# Patient Record
Sex: Female | Born: 1945 | Hispanic: Yes | Smoking: Never smoker
Health system: Southern US, Community
[De-identification: ages and names within clinical notes are randomized; demographics above are authoritative.]

## PROBLEM LIST (undated history)

## (undated) DIAGNOSIS — E079 Disorder of thyroid, unspecified: Secondary | ICD-10-CM

## (undated) DIAGNOSIS — E119 Type 2 diabetes mellitus without complications: Secondary | ICD-10-CM

## (undated) DIAGNOSIS — E785 Hyperlipidemia, unspecified: Secondary | ICD-10-CM

## (undated) DIAGNOSIS — I10 Essential (primary) hypertension: Secondary | ICD-10-CM

## (undated) DIAGNOSIS — S46819A Strain of other muscles, fascia and tendons at shoulder and upper arm level, unspecified arm, initial encounter: Secondary | ICD-10-CM

## (undated) DIAGNOSIS — H269 Unspecified cataract: Secondary | ICD-10-CM

## (undated) DIAGNOSIS — H3530 Unspecified macular degeneration: Secondary | ICD-10-CM

## (undated) DIAGNOSIS — D649 Anemia, unspecified: Secondary | ICD-10-CM

## (undated) DIAGNOSIS — N6009 Solitary cyst of unspecified breast: Secondary | ICD-10-CM

## (undated) DIAGNOSIS — M810 Age-related osteoporosis without current pathological fracture: Secondary | ICD-10-CM

## (undated) HISTORY — PX: ANKLE FRACTURE SURGERY: SHX122

## (undated) HISTORY — DX: Anemia, unspecified: D64.9

## (undated) HISTORY — DX: Unspecified cataract: H26.9

## (undated) HISTORY — DX: Hyperlipidemia, unspecified: E78.5

## (undated) HISTORY — DX: Age-related osteoporosis without current pathological fracture: M81.0

## (undated) HISTORY — DX: Strain of other muscles, fascia and tendons at shoulder and upper arm level, unspecified arm, initial encounter: S46.819A

## (undated) HISTORY — PX: CATARACT EXTRACTION: SUR2

## (undated) HISTORY — DX: Disorder of thyroid, unspecified: E07.9

## (undated) HISTORY — PX: TONSILLECTOMY: SUR1361

---

## 2013-04-15 ENCOUNTER — Encounter (HOSPITAL_COMMUNITY): Payer: Self-pay | Admitting: Emergency Medicine

## 2013-04-15 ENCOUNTER — Emergency Department (HOSPITAL_COMMUNITY)
Admission: EM | Admit: 2013-04-15 | Discharge: 2013-04-15 | Disposition: A | Discharge status: Home / Self Care | Payer: PRIVATE HEALTH INSURANCE | Source: Home / Self Care | Attending: Family Medicine | Admitting: Family Medicine

## 2013-04-15 DIAGNOSIS — I10 Essential (primary) hypertension: Secondary | ICD-10-CM

## 2013-04-15 DIAGNOSIS — E119 Type 2 diabetes mellitus without complications: Secondary | ICD-10-CM

## 2013-04-15 HISTORY — DX: Unspecified macular degeneration: H35.30

## 2013-04-15 HISTORY — DX: Essential (primary) hypertension: I10

## 2013-04-15 HISTORY — DX: Type 2 diabetes mellitus without complications: E11.9

## 2013-04-15 LAB — POCT I-STAT, CHEM 8
Chloride: 102 mEq/L (ref 96–112)
Glucose, Bld: 255 mg/dL — ABNORMAL HIGH (ref 70–99)
HCT: 37 % (ref 36.0–46.0)
Hemoglobin: 12.6 g/dL (ref 12.0–15.0)
Potassium: 4.2 mEq/L (ref 3.5–5.1)
Sodium: 140 mEq/L (ref 135–145)
TCO2: 25 mmol/L (ref 0–100)

## 2013-04-15 MED ORDER — INSULIN DETEMIR 100 UNIT/ML FLEXPEN: 30.0000 [IU] | PEN_INJECTOR | Freq: Every morning | SUBCUTANEOUS | Status: DC

## 2013-04-15 MED ORDER — GLIPIZIDE-METFORMIN HCL 5-500 MG PO TABS: 1.0000 | ORAL_TABLET | Freq: Two times a day (BID) | ORAL | Status: DC

## 2013-04-15 MED ORDER — OLMESARTAN MEDOXOMIL-HCTZ 40-12.5 MG PO TABS: 1.0000 | ORAL_TABLET | Freq: Every day | ORAL | Status: DC

## 2013-04-15 NOTE — ED Notes (Signed)
Staff making calls to see if scripts can be filled and [facility]

## 2013-04-15 NOTE — ED Notes (Signed)
Medication refill. Appointment is scheduled for 12/23, patient will run out of medicine prior to then

## 2013-04-15 NOTE — ED Provider Notes (Signed)
CSN: [id number]     Arrival date & time 04/15/13  0901 History   First MD Initiated Contact with Patient 04/15/13 [phone]     Chief Complaint  Patient presents with  . Medication Refill   (Consider location/radiation/quality/duration/timing/severity/associated sxs/prior Treatment) Patient is a 67 y.o. female presenting with general illness. The history is provided by the patient and a relative.  Illness Context:  Here for med refills as she has md appt in dec and meds have run out for dm and hbp. Associated symptoms: no abdominal pain, no chest pain and no fever     Past Medical History  Diagnosis Date  . Hypertension   . Diabetes mellitus without complication   . Macular degeneration    History reviewed. No pertinent past surgical history. No family history on file. History  Substance Use Topics  . Smoking status: Never Smoker   . Smokeless tobacco: Not on file  . Alcohol Use: No   OB History   Grav Para Term Preterm Abortions TAB SAB Ect Mult Living                 Review of Systems  Constitutional: Negative.  Negative for fever.  Cardiovascular: Negative for chest pain.  Gastrointestinal: Negative for abdominal pain.  Neurological: Negative.     Allergies  Review of patient's allergies indicates no known allergies.  Home Medications   Current Outpatient Rx  Name  Route  Sig  Dispense  Refill  . glipiZIDE-metformin (METAGLIP) 5-500 MG per tablet   Oral   Take 1 tablet by mouth 2 (two) times daily before a meal.         . insulin detemir (LEVEMIR) 100 UNIT/ML injection   Subcutaneous   Inject 30 Units into the skin at bedtime.         Marland Kitchen olmesartan-hydrochlorothiazide (BENICAR HCT) 40-12.5 MG per tablet   Oral   Take 1 tablet by mouth daily.         Marland Kitchen glipiZIDE-metformin (METAGLIP) 5-500 MG per tablet   Oral   Take 1 tablet by mouth 2 (two) times daily before a meal.   60 tablet   1   . Insulin Detemir 100 UNIT/ML SOPN   Subcutaneous   Inject 30  Units into the skin every morning.   3 pen   1   . olmesartan-hydrochlorothiazide (BENICAR HCT) 40-12.5 MG per tablet   Oral   Take 1 tablet by mouth daily.   30 tablet   1    BP 181/98  Pulse 78  Temp(Src) 98.4 F (36.9 C) (Oral)  Resp 17  SpO2 95% Physical Exam  Nursing note and vitals reviewed. Constitutional: She is oriented to person, place, and time. She appears well-developed and well-nourished.  Cardiovascular: Regular rhythm.   Musculoskeletal: She exhibits no edema.  Neurological: She is alert and oriented to person, place, and time.  Skin: Skin is warm and dry.    ED Course  Procedures (including critical care time) Labs Review Labs Reviewed  POCT I-STAT, CHEM 8 - Abnormal; Notable for the following:    Glucose, Bld 255 (*)    All other components within normal limits   Imaging Review No results found.  EKG Interpretation     Ventricular Rate:    PR Interval:    QRS Duration:   QT Interval:    QTC Calculation:   R Axis:     Text Interpretation:  MDM      Linna Hoff, MD 04/15/13 [phone]

## 2013-05-31 ENCOUNTER — Ambulatory Visit: Payer: PRIVATE HEALTH INSURANCE | Attending: Internal Medicine | Admitting: Internal Medicine

## 2013-05-31 ENCOUNTER — Encounter: Payer: Self-pay | Admitting: Internal Medicine

## 2013-05-31 VITALS — BP 150/80 | HR 73 | Temp 99.1°F | Resp 17 | Wt 181.8 lb

## 2013-05-31 DIAGNOSIS — Z139 Encounter for screening, unspecified: Secondary | ICD-10-CM

## 2013-05-31 DIAGNOSIS — Z23 Encounter for immunization: Secondary | ICD-10-CM

## 2013-05-31 DIAGNOSIS — Z1211 Encounter for screening for malignant neoplasm of colon: Secondary | ICD-10-CM

## 2013-05-31 DIAGNOSIS — K5900 Constipation, unspecified: Secondary | ICD-10-CM

## 2013-05-31 DIAGNOSIS — E119 Type 2 diabetes mellitus without complications: Secondary | ICD-10-CM | POA: Insufficient documentation

## 2013-05-31 DIAGNOSIS — I10 Essential (primary) hypertension: Secondary | ICD-10-CM | POA: Insufficient documentation

## 2013-05-31 LAB — POCT GLYCOSYLATED HEMOGLOBIN (HGB A1C): Hemoglobin A1C: 9.5

## 2013-05-31 LAB — GLUCOSE, POCT (MANUAL RESULT ENTRY): POC Glucose: 240 mg/dl — AB (ref 70–99)

## 2013-05-31 MED ORDER — OLMESARTAN MEDOXOMIL-HCTZ 40-12.5 MG PO TABS: 1.0000 | ORAL_TABLET | Freq: Every day | ORAL | Status: DC

## 2013-05-31 MED ORDER — INSULIN NPH ISOPHANE & REGULAR (70-30) 100 UNIT/ML ~~LOC~~ SUSP: 18.0000 [IU] | Freq: Two times a day (BID) | SUBCUTANEOUS | Status: DC

## 2013-05-31 MED ORDER — GLIPIZIDE-METFORMIN HCL 5-500 MG PO TABS: 2.0000 | ORAL_TABLET | Freq: Two times a day (BID) | ORAL | Status: DC

## 2013-05-31 MED ORDER — MAGNESIUM HYDROXIDE 400 MG/5ML PO SUSP: 5.0000 mL | Freq: Every day | ORAL | Status: DC | PRN

## 2013-05-31 NOTE — Progress Notes (Signed)
Patient Demographics  Cynthia Mata, is a 67 y.o. female  ZOX:[id number]  [id number]  DOB - 08-29-1945  CC:  Chief Complaint  Patient presents with  . Diabetes       HPI: Cynthia Mata is a 67 y.o. female here today to establish medical care. She has history of diabetes hypertension, she was taking glipizide/metformin 1 tab twice a day as well as Levemir 30 units each bedtime, she denies any hypoglycemic symptoms today her hemoglobin A1c is 9.5%. Patient used to see an ophthalmologist in the past. Patient reported to have constipation. Patient has No headache, No chest pain, No abdominal pain - No Nausea, No new weakness tingling or numbness, No Cough - SOB.  No Known Allergies Past Medical History  Diagnosis Date  . Hypertension   . Diabetes mellitus without complication   . Macular degeneration    Current Outpatient Prescriptions on File Prior to Visit  Medication Sig Dispense Refill  . glipiZIDE-metformin (METAGLIP) 5-500 MG per tablet Take 1 tablet by mouth 2 (two) times daily before a meal.      . Insulin Detemir 100 UNIT/ML SOPN Inject 30 Units into the skin every morning.  3 pen  1   No current facility-administered medications on file prior to visit.   Family History  Problem Relation Age of Onset  . Hypertension Maternal Aunt    History   Social History  . Marital Status: Married    Spouse Name: N/A    Number of Children: N/A  . Years of Education: N/A   Occupational History  . Not on file.   Social History Main Topics  . Smoking status: Never Smoker   . Smokeless tobacco: Not on file  . Alcohol Use: No  . Drug Use: No  . Sexual Activity: Not on file   Other Topics Concern  . Not on file   Social History Narrative  . No narrative on file    Review of Systems: Constitutional: Negative for fever, chills, diaphoresis, activity change, appetite change and fatigue. HENT: Negative for ear pain, nosebleeds, congestion, facial  swelling, rhinorrhea, neck pain, neck stiffness and ear discharge.  Eyes: Negative for pain, discharge, redness, itching and visual disturbance. Respiratory: Negative for cough, choking, chest tightness, shortness of breath, wheezing and stridor.  Cardiovascular: Negative for chest pain, palpitations and leg swelling. Gastrointestinal: Negative for abdominal distention. + for constipation  Genitourinary: Negative for dysuria, urgency, frequency, hematuria, flank pain, decreased urine volume, difficulty urinating and dyspareunia.  Musculoskeletal: Negative for back pain, joint swelling, arthralgia and gait problem. Neurological: Negative for dizziness, tremors, seizures, syncope, facial asymmetry, speech difficulty, weakness, light-headedness, numbness and headaches.  Hematological: Negative for adenopathy. Does not bruise/bleed easily. Psychiatric/Behavioral: Negative for hallucinations, behavioral problems, confusion, dysphoric mood, decreased concentration and agitation.    Objective:   Filed Vitals:   05/31/13 1620  BP: 150/80  Pulse:   Temp:   Resp:     Physical Exam: Constitutional: Patient appears well-developed and well-nourished. No distress. HENT: Normocephalic, atraumatic, External right and left ear normal. Oropharynx is clear and moist.  Eyes: Conjunctivae and EOM are normal. PERRLA, no scleral icterus. Neck: Normal ROM. Neck supple. No JVD. No tracheal deviation. No thyromegaly. CVS: RRR, S1/S2 +, no murmurs, no gallops, no carotid bruit.  Pulmonary: Effort and breath sounds normal, no stridor, rhonchi, wheezes, rales.  Abdominal: Soft. BS +, no distension, tenderness, rebound or guarding.  Musculoskeletal: Normal range of motion. No edema and no tenderness.  Neuro: Alert. Normal reflexes, muscle tone coordination. No cranial nerve deficit. Skin: Skin is warm and dry. No rash noted. Not diaphoretic. No erythema. No pallor. Psychiatric: Normal mood and affect. Behavior,  judgment, thought content normal.  Lab Results  Component Value Date   HGB 12.6 04/15/2013   HCT 37.0 04/15/2013   Lab Results  Component Value Date   CREATININE 0.90 04/15/2013   BUN 19 04/15/2013   NA 140 04/15/2013   K 4.2 04/15/2013   CL 102 04/15/2013    Lab Results  Component Value Date   HGBA1C 9.5 05/31/2013   Lipid Panel  No results found for this basename: chol, trig, hdl, cholhdl, vldl, ldlcalc       Assessment and plan:   1. DM (diabetes mellitus)  - Glucose (CBG) - HgB A1c 9.5% uncontrolled - Ambulatory referral to Ophthalmology -I have increased the dose glipiZIDE-metformin (METAGLIP) 5-500 MG per tablet; Take 2 tablets by mouth 2 (two) times daily before a meal.  Dispense: 120 tablet; Refill: 1 Humulin 70/30 18 units twice a day (patient can't afford Levemir)  2. Essential hypertension, benign  - olmesartan-hydrochlorothiazide (BENICAR HCT) 40-12.5 MG per tablet; Take 1 tablet by mouth daily.  Dispense: 30 tablet; Refill: 1 Advise for low salt diet we'll reevaluate on the next visit if blood pressure is persistently high consider adding another medication.  3. Screening  - COMPLETE METABOLIC PANEL WITH GFR; Future - CBC with Differential; Future - TSH; Future - Lipid panel; Future - Vit D  25 hydroxy (rtn osteoporosis monitoring); Future   5. Special screening for malignant neoplasms, colon  - Ambulatory referral to Gastroenterology  6. Unspecified constipation  - magnesium hydroxide (MILK OF MAGNESIA) 400 MG/5ML suspension; Take 5 mLs by mouth daily as needed for mild constipation.  Dispense: 360 mL; Refill: 0       Health Maintenance -Colonoscopy: referral done  Flu shot today   Follow up in 6 weeks    Jandiel Magallanes, Ayesha Rumpf, MD

## 2013-05-31 NOTE — Progress Notes (Signed)
Patient here to establish care History of DM and HTN Is currently on levemir but would like dr to switch to insulin we carry in our pharmacy

## 2013-05-31 NOTE — Patient Instructions (Signed)
2 Gram Low Sodium Diet A 2 gram sodium diet restricts the amount of sodium in the diet to no more than 2 g or 2000 mg daily. Limiting the amount of sodium is often used to help lower blood pressure. It is important if you have heart, liver, or kidney problems. Many foods contain sodium for flavor and sometimes as a preservative. When the amount of sodium in a diet needs to be low, it is important to know what to look for when choosing foods and drinks. The following includes some information and guidelines to help make it easier for you to adapt to a low sodium diet. QUICK TIPS  Do not add salt to food.  Avoid convenience items and fast food.  Choose unsalted snack foods.  Buy lower sodium products, often labeled as "lower sodium" or "no salt added."  Check food labels to learn how much sodium is in 1 serving.  When eating at a restaurant, ask that your food be prepared with less salt or none, if possible. READING FOOD LABELS FOR SODIUM INFORMATION The nutrition facts label is a good place to find how much sodium is in foods. Look for products with no more than 500 to 600 mg of sodium per meal and no more than 150 mg per serving. Remember that 2 g = 2000 mg. The food label may also list foods as:  Sodium-free: Less than 5 mg in a serving.  Very low sodium: 35 mg or less in a serving.  Low-sodium: 140 mg or less in a serving.  Light in sodium: 50% less sodium in a serving. For example, if a food that usually has 300 mg of sodium is changed to become light in sodium, it will have 150 mg of sodium.  Reduced sodium: 25% less sodium in a serving. For example, if a food that usually has 400 mg of sodium is changed to reduced sodium, it will have 300 mg of sodium. CHOOSING FOODS Grains  Avoid: Salted crackers and snack items. Some cereals, including instant hot cereals. Bread stuffing and biscuit mixes. Seasoned rice or pasta mixes.  Choose: Unsalted snack items. Low-sodium cereals, oats,  puffed wheat and rice, shredded wheat. English muffins and bread. Pasta. Meats  Avoid: Salted, canned, smoked, spiced, pickled meats, including fish and poultry. Bacon, ham, sausage, cold cuts, hot dogs, anchovies.  Choose: Low-sodium canned tuna and salmon. Fresh or frozen meat, poultry, and fish. Dairy  Avoid: Processed cheese and spreads. Cottage cheese. Buttermilk and condensed milk. Regular cheese.  Choose: Milk. Low-sodium cottage cheese. Yogurt. Sour cream. Low-sodium cheese. Fruits and Vegetables  Avoid: Regular canned vegetables. Regular canned tomato sauce and paste. Frozen vegetables in sauces. Olives. Pickles. Relishes. Sauerkraut.  Choose: Low-sodium canned vegetables. Low-sodium tomato sauce and paste. Frozen or fresh vegetables. Fresh and frozen fruit. Condiments  Avoid: Canned and packaged gravies. Worcestershire sauce. Tartar sauce. Barbecue sauce. Soy sauce. Steak sauce. Ketchup. Onion, garlic, and table salt. Meat flavorings and tenderizers.  Choose: Fresh and dried herbs and spices. Low-sodium varieties of mustard and ketchup. Lemon juice. Tabasco sauce. Horseradish. SAMPLE 2 GRAM SODIUM MEAL PLAN Breakfast / Sodium (mg)  1 cup low-fat milk / 143 mg  2 slices whole-wheat toast / 270 mg  1 tbs heart-healthy margarine / 153 mg  1 hard-boiled egg / 139 mg  1 small orange / 0 mg Lunch / Sodium (mg)  1 cup raw carrots / 76 mg   cup hummus / 298 mg  1 cup low-fat milk /   143 mg   cup red grapes / 2 mg  1 whole-wheat pita bread / 356 mg Dinner / Sodium (mg)  1 cup whole-wheat pasta / 2 mg  1 cup low-sodium tomato sauce / 73 mg  3 oz lean ground beef / 57 mg  1 small side salad (1 cup raw spinach leaves,  cup cucumber,  cup yellow bell pepper) with 1 tsp olive oil and 1 tsp red wine vinegar / 25 mg Snack / Sodium (mg)  1 container low-fat vanilla yogurt / 107 mg  3 graham cracker squares / 127 mg Nutrient Analysis  Calories: 2033  Protein:  77 g  Carbohydrate: 282 g  Fat: 72 g  Sodium: 1971 mg Document Released: 05/26/2005 Document Revised: 08/18/2011 Document Reviewed: 08/27/2009 [employer] Patient Information 2014 [employer].  

## 2013-07-06 ENCOUNTER — Other Ambulatory Visit: Payer: PRIVATE HEALTH INSURANCE

## 2013-07-12 ENCOUNTER — Ambulatory Visit: Payer: Self-pay

## 2013-08-14 LAB — HM DIABETES EYE EXAM

## 2014-06-29 ENCOUNTER — Ambulatory Visit (INDEPENDENT_AMBULATORY_CARE_PROVIDER_SITE_OTHER): Payer: 59 | Admitting: Medical

## 2014-06-29 ENCOUNTER — Telehealth: Payer: Self-pay | Admitting: *Deleted

## 2014-06-29 ENCOUNTER — Encounter: Payer: Self-pay | Admitting: Medical

## 2014-06-29 VITALS — BP 150/80 | HR 74 | Temp 97.6°F | Ht 61.0 in | Wt 178.8 lb

## 2014-06-29 DIAGNOSIS — E119 Type 2 diabetes mellitus without complications: Secondary | ICD-10-CM

## 2014-06-29 DIAGNOSIS — E039 Hypothyroidism, unspecified: Secondary | ICD-10-CM

## 2014-06-29 DIAGNOSIS — J0100 Acute maxillary sinusitis, unspecified: Secondary | ICD-10-CM

## 2014-06-29 DIAGNOSIS — I10 Essential (primary) hypertension: Secondary | ICD-10-CM

## 2014-06-29 DIAGNOSIS — D649 Anemia, unspecified: Secondary | ICD-10-CM

## 2014-06-29 DIAGNOSIS — E785 Hyperlipidemia, unspecified: Secondary | ICD-10-CM

## 2014-06-29 DIAGNOSIS — E038 Other specified hypothyroidism: Secondary | ICD-10-CM

## 2014-06-29 LAB — COMPREHENSIVE METABOLIC PANEL
ALT: 23 U/L (ref 0–35)
AST: 26 U/L (ref 0–37)
Albumin: 4 g/dL (ref 3.5–5.2)
Alkaline Phosphatase: 92 U/L (ref 39–117)
BUN: 15 mg/dL (ref 6–23)
CALCIUM: 9.2 mg/dL (ref 8.4–10.5)
CO2: 29 meq/L (ref 19–32)
CREATININE: 0.73 mg/dL (ref 0.40–1.20)
Chloride: 103 mEq/L (ref 96–112)
GFR: 84.44 mL/min (ref >60.00)
GLUCOSE: 158 mg/dL — AB (ref 70–99)
POTASSIUM: 3.7 meq/L (ref 3.5–5.1)
SODIUM: 140 meq/L (ref 135–145)
Total Bilirubin: 0.3 mg/dL (ref 0.2–1.2)
Total Protein: 7.2 g/dL (ref 6.0–8.3)

## 2014-06-29 LAB — LIPID PANEL
CHOLESTEROL: 206 mg/dL — AB (ref 0–200)
HDL: 51.7 mg/dL (ref >39.00)
LDL CALC: 134 mg/dL — AB (ref 0–99)
NonHDL: 154.3
Total CHOL/HDL Ratio: 4
Triglycerides: 100 mg/dL (ref 0.0–149.0)
VLDL: 20 mg/dL (ref 0.0–40.0)

## 2014-06-29 LAB — CBC WITH DIFFERENTIAL/PLATELET
Basophils Absolute: 0 10*3/uL (ref 0.0–0.1)
Basophils Relative: 0.4 % (ref 0.0–3.0)
EOS ABS: 0.1 10*3/uL (ref 0.0–0.7)
EOS PCT: 1.5 % (ref 0.0–5.0)
HCT: 33.3 % — ABNORMAL LOW (ref 36.0–46.0)
HEMOGLOBIN: 11 g/dL — AB (ref 12.0–15.0)
Lymphocytes Relative: 31.7 % (ref 12.0–46.0)
Lymphs Abs: 1.7 10*3/uL (ref 0.7–4.0)
MCHC: 33 g/dL (ref 30.0–36.0)
MCV: 86.7 fl (ref 78.0–100.0)
MONO ABS: 0.3 10*3/uL (ref 0.1–1.0)
Monocytes Relative: 6.5 % (ref 3.0–12.0)
NEUTROS PCT: 59.9 % (ref 43.0–77.0)
Neutro Abs: 3.1 10*3/uL (ref 1.4–7.7)
Platelets: 299 10*3/uL (ref 150.0–400.0)
RBC: 3.84 Mil/uL — ABNORMAL LOW (ref 3.87–5.11)
RDW: 13.9 % (ref 11.5–15.5)
WBC: 5.3 10*3/uL (ref 4.0–10.5)

## 2014-06-29 LAB — TSH: TSH: 3.74 u[IU]/mL (ref 0.35–4.50)

## 2014-06-29 LAB — HEMOGLOBIN A1C: Hgb A1c MFr Bld: 9.4 % — ABNORMAL HIGH (ref 4.6–6.5)

## 2014-06-29 MED ORDER — FLUTICASONE PROPIONATE 50 MCG/ACT NA SUSP: 2.0000 | Freq: Every day | NASAL | Status: DC

## 2014-06-29 MED ORDER — CEFDINIR 300 MG PO CAPS: 300.0000 mg | ORAL_CAPSULE | Freq: Two times a day (BID) | ORAL | Status: DC

## 2014-06-29 MED ORDER — LOSARTAN POTASSIUM-HCTZ 50-12.5 MG PO TABS: 1.0000 | ORAL_TABLET | Freq: Every day | ORAL | Status: DC

## 2014-06-29 MED ORDER — ATORVASTATIN CALCIUM 20 MG PO TABS: 20.0000 mg | ORAL_TABLET | Freq: Every day | ORAL | Status: DC

## 2014-06-29 NOTE — Assessment & Plan Note (Signed)
For your diabetes- continue current med regimen. Depending on a1-c may make adjustments. If a1-c on lower side. May drop glipizide.

## 2014-06-29 NOTE — Progress Notes (Signed)
Subjective:    Patient ID: Cynthia Mata, female    DOB: Dec 26, 1945, 69 y.o.   MRN: [id number]  HPI   Pt in with son. First time here.  I have reviewed pt PMH, PSH, FH, Social History and Surgical History  Anemia- hx of mild anemia in past. Given iron in the past.That was in [place].   Diabetes- Has been using insulin for 4 years. Pt last a1-c was 6.8 and basicaly the same. Last A1-c maybe 6 months ago. Pt is using 18 units am and 18 units pm. Very rare occasional bs 50. That was one month ago and only one time. She drank little coke and her bs went back up. Pt is also on met-glipizide.  Pt has history of high blood pressure. She is on procardia xl 60 mg and benicar hct 40-12.5 mg. Pt only used these medication. No others tried before. Pt did not take bp med today.  Pt has hyper lipedemia. No recent checks. 1 month ago she had stress test and did well on that.  No current chest pain. No neurologic signs or symptoms.  Pt told in past low tsh in the past. Pt stopped synthroid when ran out of the medication.  Hx of low vitamin d. She is taking that now.  2 weeks of congestion, sinus pressure, teeth pain. Mild productive cough.  Past Medical History  Diagnosis Date  . Hypertension   . Diabetes mellitus without complication   . Macular degeneration   . Anemia   . Cataract   . Sleep apnea   . Hyperlipidemia   . Osteoporosis   . Thyroid disease     History   Social History  . Marital Status: Married    Spouse Name: N/A    Number of Children: N/A  . Years of Education: N/A   Occupational History  . Not on file.   Social History Main Topics  . Smoking status: Never Smoker   . Smokeless tobacco: Not on file  . Alcohol Use: No  . Drug Use: No  . Sexual Activity: Not on file   Other Topics Concern  . Not on file   Social History Narrative    Past Surgical History  Procedure Laterality Date  . Ankle fracture surgery      Right ankle  . Tonsillectomy       Abcess removed also    Family History  Problem Relation Age of Onset  . Hypertension Maternal Aunt     No Known Allergies  Current Outpatient Prescriptions on File Prior to Visit  Medication Sig Dispense Refill  . insulin NPH-regular (NOVOLIN 70/30) (70-30) 100 UNIT/ML injection Inject 18 Units into the skin 2 (two) times daily with a meal. 10 mL 6  . olmesartan-hydrochlorothiazide (BENICAR HCT) 40-12.5 MG per tablet Take 1 tablet by mouth daily. 30 tablet 1  . glipiZIDE-metformin (METAGLIP) 5-500 MG per tablet Take 1 tablet by mouth 2 (two) times daily before a meal.    . glipiZIDE-metformin (METAGLIP) 5-500 MG per tablet Take 2 tablets by mouth 2 (two) times daily before a meal. (Patient not taking: Reported on 06/29/2014) 120 tablet 1  . Insulin Detemir 100 UNIT/ML SOPN Inject 30 Units into the skin every morning. (Patient not taking: Reported on 06/29/2014) 3 pen 1  . magnesium hydroxide (MILK OF MAGNESIA) 400 MG/5ML suspension Take 5 mLs by mouth daily as needed for mild constipation. (Patient not taking: Reported on 06/29/2014) 360 mL 0   No  current facility-administered medications on file prior to visit.    BP 150/80 mmHg  Pulse 74  Temp(Src) 97.6 F (36.4 C) (Oral)  Ht _0  (1.549 m)  Wt 178 lb 12.8 oz (81.103 kg)  BMI 33.80 kg/m2  SpO2 100%      Review of Systems  Constitutional: Negative for fever, chills, diaphoresis, activity change and fatigue.  HENT: Positive for congestion and sinus pressure.   Respiratory: Positive for cough. Negative for chest tightness and shortness of breath.   Cardiovascular: Negative for chest pain, palpitations and leg swelling.  Gastrointestinal: Negative for nausea, vomiting and abdominal pain.  Endocrine: Negative for polydipsia, polyphagia and polyuria.  Musculoskeletal: Negative for neck pain and neck stiffness.  Neurological: Negative for dizziness, tremors, seizures, syncope, facial asymmetry, speech difficulty, weakness,  light-headedness, numbness and headaches.  Psychiatric/Behavioral: Negative for behavioral problems, confusion and agitation. The patient is not nervous/anxious.        Objective:   Physical Exam   General Mental Status- Alert. General Appearance- Not in acute distress.   Skin General: Color- Normal Color. Moisture- Normal Moisture.  Neck Carotid Arteries- Normal color. Moisture- Normal Moisture. No carotid bruits. No JVD.  HEENT Head- Normal. Ear Auditory Canal - Left- Normal. Right - Normal.Tympanic Membrane- Left- Normal. Right- Normal. Eye Sclera/Conjunctiva- Left- Normal. Right- Normal. Nose & Sinuses Nasal Mucosa- Left-  Boggy and Congested. Right-  Boggy and  Congested.Bilateral maxillary and frontal sinus pressure. Mouth & Throat Lips: Upper Lip- Normal: no dryness, cracking, pallor, cyanosis, or vesicular eruption. Lower Lip-Normal: no dryness, cracking, pallor, cyanosis or vesicular eruption. Buccal Mucosa- Bilateral- No Aphthous ulcers. Oropharynx- No Discharge or Erythema. Tonsils: Characteristics- Bilateral- No Erythema or Congestion. Size/Enlargement- Bilateral- No enlargement. Discharge- bilateral-None.  Neck Neck- Supple. No Masses.   Chest and Lung Exam Auscultation: Breath Sounds:-Normal.  Cardiovascular Auscultation:Rythm- Regular. Murmurs & Other Heart Sounds:Auscultation of the heart reveals- No Murmurs.  Abdomen Inspection:-Inspeection Normal. Palpation/Percussion:Note:No mass. Palpation and Percussion of the abdomen reveal- Non Tender, Non Distended + BS, no rebound or guarding.    Neurologic Cranial Nerve exam:- CN III-XII intact(No nystagmus), symmetric smile. Drift Test:- No drift. Romberg Exam:- Negative.  Heal to Toe Gait exam:-Normal. Finger to Nose:- Normal/Intact Strength:- 5/5 equal and symmetric strength both upper and lower extremities.        Assessment & Plan:

## 2014-06-29 NOTE — Assessment & Plan Note (Signed)
  Your appear to have a sinus infection. I am prescribing cefdinir  for the infection. To help with the nasal congestion I prescribed  flonase nasal steroid.

## 2014-06-29 NOTE — Progress Notes (Signed)
Pre visit review using our clinic review tool, if applicable. No additional management support is needed unless otherwise documented below in the visit note. 

## 2014-06-29 NOTE — Telephone Encounter (Signed)
Refill of her atorvastatin. Losartan rx in place of benicar since she will run out in 4 days. Insurance told her not covered. Benciar $300 dollars in month. Appears no time for prior authrorization. By Monday she will be on last tab of benicar.

## 2014-06-29 NOTE — Assessment & Plan Note (Signed)
For you anemia. Will check today with cbc.

## 2014-06-29 NOTE — Telephone Encounter (Signed)
Caller name: Mare LoanCecilia Relation to pt: self Call back number: [phone] [facility] [address], [place]  Reason for call: Pt called insurance, they will not cover Benicar.  She has 4 pills left. She has refills, but can not afford it.  Please advise.

## 2014-06-29 NOTE — Assessment & Plan Note (Signed)
Check you lipid panel today. Then refill atorvastatin accordingly.

## 2014-06-29 NOTE — Assessment & Plan Note (Signed)
For your bp continue the current regimen procardia and benicar/hct. May need to change to other agent in future if insurance will not cover.

## 2014-06-29 NOTE — Assessment & Plan Note (Signed)
For you hypothyroid will get tsh today.

## 2014-06-29 NOTE — Patient Instructions (Addendum)
For you anemia. Will check today with cbc.  For your diabetes- continue current med regimen. Depending on a1-c may make adjustments. If a1-c on lower side. May drop glipizide.  For your bp continue the current regimen procardia and benicar/hct. May need to change to other agent if insurance will not cover.    For you hypothyroid will get tsh today.  Your appear to have a sinus infection. I am prescribing cefdinir  for the infection. To help with the nasal congestion I prescribed  flonase nasal steroid.   Rest, hydrate, tylenol for fever.  Follow up in 2 wks or as needed

## 2014-07-03 ENCOUNTER — Telehealth: Payer: Self-pay | Admitting: Medical

## 2014-07-03 NOTE — Telephone Encounter (Signed)
I talked with the son and advised on new bp med, atorvastatin refill and lab results.

## 2014-07-20 ENCOUNTER — Encounter: Payer: Self-pay | Admitting: Medical

## 2014-07-20 ENCOUNTER — Ambulatory Visit (INDEPENDENT_AMBULATORY_CARE_PROVIDER_SITE_OTHER): Payer: 59 | Admitting: Medical

## 2014-07-20 ENCOUNTER — Other Ambulatory Visit: Payer: Self-pay

## 2014-07-20 VITALS — BP 145/80 | HR 77 | Temp 97.8°F | Ht 61.0 in | Wt 179.2 lb

## 2014-07-20 DIAGNOSIS — E1065 Type 1 diabetes mellitus with hyperglycemia: Secondary | ICD-10-CM

## 2014-07-20 DIAGNOSIS — I10 Essential (primary) hypertension: Secondary | ICD-10-CM

## 2014-07-20 MED ORDER — INSULIN PEN NEEDLE 31G X 8 MM MISC: 1.0000 | Freq: Two times a day (BID) | Status: DC

## 2014-07-20 MED ORDER — ONETOUCH ULTRASOFT LANCETS MISC: Status: DC

## 2014-07-20 MED ORDER — INSULIN NPH ISOPHANE & REGULAR (70-30) 100 UNIT/ML ~~LOC~~ SUSP: SUBCUTANEOUS | Status: DC

## 2014-07-20 MED ORDER — INSULIN NPH ISOPHANE & REGULAR (70-30) 100 UNIT/ML ~~LOC~~ SUSP: 18.0000 [IU] | Freq: Two times a day (BID) | SUBCUTANEOUS | Status: DC

## 2014-07-20 MED ORDER — OLMESARTAN MEDOXOMIL-HCTZ 40-12.5 MG PO TABS: 1.0000 | ORAL_TABLET | Freq: Every day | ORAL | Status: DC

## 2014-07-20 MED ORDER — GLUCOSE BLOOD VI STRP: ORAL_STRIP | Status: DC

## 2014-07-20 NOTE — Addendum Note (Signed)
Addended by: Lurline HareARTER, Robynne Roat E on: 07/20/2014 06:14 PM   Modules accepted: Orders

## 2014-07-20 NOTE — Assessment & Plan Note (Signed)
With your elevated bs close to 500 at times at night and your average a1-c 9.5, I will refer you to endocrinologist. I refilled your Novolun 70/30 today. I want you to increase by 1 unit am and 1 unit pm each day. Continue to check your bs twice a day. Ex tomorrow will use 21 units am  and 21 untis in pm.  I will call in refill of your supplies.  Follow up in 3 months or as needed.

## 2014-07-20 NOTE — Progress Notes (Signed)
Pre visit review using our clinic review tool, if applicable. No additional management support is needed unless otherwise documented below in the visit note. 

## 2014-07-20 NOTE — Progress Notes (Signed)
Subjective:    Patient ID: Cynthia Mata, female    DOB: [birth year], 69 y.o.   MRN: [id number]  HPI   Pt in for follow up. Pt is on benicar. Insurance eventually agreed would pay. Pt is on procardia. No obvious neuro or cardiac symptoms.  Pt blood sugar is elevated. Pt blood sugar moderatley controlled in the am(high 100's) but evenings quite high up to 500 at times. Pt diabetes for 20 years.   Pt is on NPH insulin 70/30. Pt using 20 units in am and 20 units in pm. Last a1-C 9.5.  Pt needs refill of insulin, strips, and lancets.  Pt last tsh was normal.    Review of Systems  Constitutional: Negative for fever, chills, diaphoresis, activity change and fatigue.  Respiratory: Negative for cough, chest tightness and shortness of breath.   Cardiovascular: Negative for chest pain, palpitations and leg swelling.  Gastrointestinal: Negative for nausea, vomiting and abdominal pain.  Musculoskeletal: Negative for neck pain and neck stiffness.  Neurological: Negative for dizziness, tremors, seizures, syncope, facial asymmetry, speech difficulty, weakness, light-headedness, numbness and headaches.  Psychiatric/Behavioral: Negative for behavioral problems, confusion and agitation. The patient is not nervous/anxious.    Past Medical History  Diagnosis Date  . Hypertension   . Diabetes mellitus without complication   . Macular degeneration   . Anemia   . Cataract   . Sleep apnea   . Hyperlipidemia   . Osteoporosis   . Thyroid disease     History   Social History  . Marital Status: Married    Spouse Name: N/A  . Number of Children: N/A  . Years of Education: N/A   Occupational History  . Not on file.   Social History Main Topics  . Smoking status: Never Smoker   . Smokeless tobacco: Not on file  . Alcohol Use: No  . Drug Use: No  . Sexual Activity: Not on file   Other Topics Concern  . Not on file   Social History Narrative    Past Surgical History    Procedure Laterality Date  . Ankle fracture surgery      Right ankle  . Tonsillectomy      Abcess removed also    Family History  Problem Relation Age of Onset  . Hypertension Maternal Aunt     No Known Allergies  Current Outpatient Prescriptions on File Prior to Visit  Medication Sig Dispense Refill  . atorvastatin (LIPITOR) 20 MG tablet Take 1 tablet (20 mg total) by mouth daily. 30 tablet 3  . ergocalciferol (VITAMIN D2) 50000 UNITS capsule Take 50,000 Units by mouth once a week.    . fluticasone (FLONASE) 50 MCG/ACT nasal spray Place 2 sprays into both nostrils daily. 16 g 1  . glipiZIDE-metformin (METAGLIP) 5-500 MG per tablet Take 1 tablet by mouth 2 (two) times daily before a meal.    . glipiZIDE-metformin (METAGLIP) 5-500 MG per tablet Take 2 tablets by mouth 2 (two) times daily before a meal. 120 tablet 1  . magnesium hydroxide (MILK OF MAGNESIA) 400 MG/5ML suspension Take 5 mLs by mouth daily as needed for mild constipation. 360 mL 0  . NIFEdipine (PROCARDIA XL/ADALAT-CC) 60 MG 24 hr tablet Take 60 mg by mouth daily.    Marland Kitchen olmesartan-hydrochlorothiazide (BENICAR HCT) 40-12.5 MG per tablet Take 1 tablet by mouth daily. 30 tablet 1  . sitaGLIPtin-metformin (JANUMET) 50-500 MG per tablet Take 1 tablet by mouth 2 (two) times daily with a meal.    .  cefdinir (OMNICEF) 300 MG capsule Take 1 capsule (300 mg total) by mouth 2 (two) times daily. (Patient not taking: Reported on 07/20/2014) 20 capsule 0  . Insulin Detemir 100 UNIT/ML SOPN Inject 30 Units into the skin every morning. (Patient not taking: Reported on 06/29/2014) 3 pen 1   No current facility-administered medications on file prior to visit.    BP 145/80 mmHg  Pulse 77  Temp(Src) 97.8 F (36.6 C) (Oral)  Ht 5\' 1"  (1.549 m)  Wt 179 lb 3.2 oz (81.285 kg)  BMI 33.88 kg/m2  SpO2 99%       Objective:   Physical Exam   General Mental Status- Alert. General Appearance- Not in acute distress.   Skin General:  Color- Normal Color. Moisture- Normal Moisture.  Neck Carotid Arteries- Normal color. Moisture- Normal Moisture. No carotid bruits. No JVD.  Chest and Lung Exam Auscultation: Breath Sounds:-Normal.  Cardiovascular Auscultation:Rythm- Regular. Murmurs & Other Heart Sounds:Auscultation of the heart reveals- No Murmurs.  Abdomen Inspection:-Inspeection Normal. Palpation/Percussion:Note:No mass. Palpation and Percussion of the abdomen reveal- Non Tender, Non Distended + BS, no rebound or guarding.    Neurologic Cranial Nerve exam:- CN III-XII intact(No nystagmus), symmetric smile. Drift Test:- No drift. Romberg Exam:- Negative.  Heal to Toe Gait exam:-Normal. Finger to Nose:- Normal/Intact Strength:- 5/5 equal and symmetric strength both upper and lower extremities.  Feet see quality metrics portion     Assessment & Plan:

## 2014-07-20 NOTE — Assessment & Plan Note (Signed)
For your bp continue same dose of procardia and new rx of benicar/hct. Stop the losartan. Your bp is fairly controlled.

## 2014-07-20 NOTE — Patient Instructions (Addendum)
Essential hypertension, benign For your bp continue same dose of procardia and new rx of benicar/hct. Stop the losartan. Your bp is fairly controlled.    DM (diabetes mellitus) With your elevated bs close to 500 at times at night and your average a1-c 9.5, I will refer you to endocrinologist. I refilled your Novolun 70/30 today. I want you to increase by 1 unit am and 1 unit pm each day. Continue to check your bs twice a day. Ex tomorrow will use 21 units am  and 21 untis in pm.  I will call in refill of your supplies.  Follow up in 3 months or as needed.

## 2014-07-24 ENCOUNTER — Encounter: Payer: Self-pay | Admitting: Medical

## 2014-07-27 ENCOUNTER — Telehealth: Payer: Self-pay | Admitting: Medical

## 2014-07-27 DIAGNOSIS — E1165 Type 2 diabetes mellitus with hyperglycemia: Secondary | ICD-10-CM

## 2014-07-27 MED ORDER — GLIPIZIDE-METFORMIN HCL 5-500 MG PO TABS: 2.0000 | ORAL_TABLET | Freq: Two times a day (BID) | ORAL | Status: DC

## 2014-07-27 NOTE — Telephone Encounter (Signed)
Please send to [facility] [address] [place] glipizide-metformin 5-500 she takes 2 pills 2 times a day

## 2014-07-27 NOTE — Telephone Encounter (Signed)
Talked with pt son and sent in her rx.

## 2014-07-27 NOTE — Telephone Encounter (Signed)
Called to clarify that pt is not taking janumet. Will refill her metaglip

## 2014-08-07 ENCOUNTER — Encounter: Payer: Self-pay | Admitting: Endocrinology

## 2014-08-07 ENCOUNTER — Ambulatory Visit (INDEPENDENT_AMBULATORY_CARE_PROVIDER_SITE_OTHER): Payer: 59 | Admitting: Endocrinology

## 2014-08-07 VITALS — BP 134/78 | HR 75 | Temp 98.9°F | Ht 61.0 in | Wt 181.2 lb

## 2014-08-07 DIAGNOSIS — E1165 Type 2 diabetes mellitus with hyperglycemia: Secondary | ICD-10-CM

## 2014-08-07 DIAGNOSIS — E7800 Pure hypercholesterolemia, unspecified: Secondary | ICD-10-CM

## 2014-08-07 DIAGNOSIS — E08319 Diabetes mellitus due to underlying condition with unspecified diabetic retinopathy without macular edema: Secondary | ICD-10-CM

## 2014-08-07 DIAGNOSIS — IMO0002 Reserved for concepts with insufficient information to code with codable children: Secondary | ICD-10-CM

## 2014-08-07 DIAGNOSIS — I10 Essential (primary) hypertension: Secondary | ICD-10-CM

## 2014-08-07 MED ORDER — INSULIN REGULAR HUMAN 100 UNIT/ML IJ SOLN: INTRAMUSCULAR | Status: DC

## 2014-08-07 NOTE — Progress Notes (Signed)
Patient ID: Cynthia Mata, female   DOB: 11-24-1945, 69 y.o.   MRN: [id number]           Reason for Appointment: Consultation for Type 2 Diabetes  Referring physician: Saguier  History of Present Illness:          Diagnosis: Type 2 diabetes mellitus, date of diagnosis: 2001        Past history: Visit time of diagnosis she had symptoms of increased thirst and urination and very high blood sugars.  Her weight was at that time about 225 pounds. She was [place] in her [place] with oral medications possibly glipizide and metformin She also changed her diet and was able to lose weight  However she does not think her blood sugars had been consistently controlled In 2012 she was given Levemir insulin but not clear if this helped her sugar control as her A1c was still 9.5 in 2014 She was apparently continued on Metaglip also which he had been taking for a while; she has usually taken only 1 tablet twice a day of the 5/500 tablets.  Has not had any abdominal pain or diarrhea with metformin previously  Recent history:  She was apparently taking Levemir insulin last year and this was changed recently to premixed insulin twice a day. She tries to take her breakfast insulin before eating but taking her evening insulin at bedtime. Her blood sugars are improving recently with increasing her dose from 20 up to 25 units but her readings after supper are almost always over 300. Although her instructions for the oral medication have been to take Metaglip 2 tablets twice a day she is taking only 1 tablet twice a day and has done this for a while. Currently she has no excessive urination or thirst. She is on her own trying to watch her diet as far as fat and carbohydrate but has not seen a dietitian. Does not exercise or walk during cold weather; also previously had limited walking because of her right ankle fracture Her One Touch monitor is several years old       Oral hypoglycemic drugs  the patient is taking are: Metaglip bid     Side effects from medications have been: None INSULIN regimen is described as:  Novolin 70/30 25 before breakfast and 25 at bedtime, same dose for 2 weeks  Compliance with the medical regimen:  Hypoglycemia:    Glucose monitoring:  done one time a day         Glucometer: One Touch.      Blood Glucose readings by time of day and averages from home diary   PREMEAL Breakfast Lunch Dinner PCS  Overall   Glucose range: 91-190   283-438   Median:        Self-care: The diet that the patient has been following is: tries to limit Sweets, regular cokes, carbs and fats and is eating more vegetables .     Meals: 3 meals per day. Breakfast is eggs, beans and brain.  Has chicken and vegetables for lunch and dinner with some tortillas.  Has bread and nuts for snacks           Exercise:  walking 3 times a week but not when not it is cold weather         Dietician visit, most recent: Never .               Weight history:  Wt Readings from Last 3 Encounters:  08/07/14 181 lb 3.2 oz (82.192 kg)  07/20/14 179 lb 3.2 oz (81.285 kg)  06/29/14 178 lb 12.8 oz (81.103 kg)    Glycemic control:   Lab Results  Component Value Date   HGBA1C 9.4* 06/29/2014   HGBA1C 9.5 05/31/2013   Lab Results  Component Value Date   LDLCALC 134* 06/29/2014   CREATININE 0.73 06/29/2014         Medication List       This list is accurate as of: 08/07/14  9:04 AM.  Always use your most recent med list.               atorvastatin 20 MG tablet  Commonly known as:  LIPITOR  Take 1 tablet (20 mg total) by mouth daily.     fluticasone 50 MCG/ACT nasal spray  Commonly known as:  FLONASE  Place 2 sprays into both nostrils daily.     glipiZIDE-metformin 5-500 MG per tablet  Commonly known as:  METAGLIP  Take 2 tablets by mouth 2 (two) times daily before a meal.     glucose blood test strip  Use as instructed     Insulin Detemir 100 UNIT/ML Pen  Commonly known  as:  LEVEMIR  Inject 30 Units into the skin every morning.     insulin NPH-regular Human (70-30) 100 UNIT/ML injection  Commonly known as:  NOVOLIN 70/30  Use 20 units in am and pm.(increase by one unit in am and pm each day up to 25 units)     Insulin Pen Needle 31G X 8 MM Misc  1 Device by Does not apply route 2 (two) times daily.     magnesium hydroxide 400 MG/5ML suspension  Commonly known as:  MILK OF MAGNESIA  Take 5 mLs by mouth daily as needed for mild constipation.     NIFEdipine 60 MG 24 hr tablet  Commonly known as:  PROCARDIA XL/ADALAT-CC  Take 60 mg by mouth daily.     olmesartan-hydrochlorothiazide 40-12.5 MG per tablet  Commonly known as:  BENICAR HCT  Take 1 tablet by mouth daily.     olmesartan-hydrochlorothiazide 40-12.5 MG per tablet  Commonly known as:  BENICAR HCT  Take 1 tablet by mouth daily.     onetouch ultrasoft lancets  Use as instructed        Allergies: No Known Allergies  Past Medical History  Diagnosis Date  . Hypertension   . Diabetes mellitus without complication   . Macular degeneration   . Anemia   . Cataract   . Sleep apnea   . Hyperlipidemia   . Osteoporosis   . Thyroid disease     Past Surgical History  Procedure Laterality Date  . Ankle fracture surgery      Right ankle  . Tonsillectomy      Abcess removed also    Family History  Problem Relation Age of Onset  . Hypertension Maternal Aunt     Social History:  reports that she has never smoked. She does not have any smokeless tobacco history on file. She reports that she does not drink alcohol or use illicit drugs.    Review of Systems       Vision is normal. Most recent eye exam was in 7/15 in [place]       Lipids: On Rx since 7/15 and no changes made in her dosage recently       Lab Results  Component Value Date   CHOL 206* 06/29/2014   HDL  51.70 06/29/2014   LDLCALC 134* 06/29/2014   TRIG 100.0 06/29/2014   CHOLHDL 4 06/29/2014                   Skin: No rash or infections     Thyroid:  No  unusual fatigue.  In 11/15 was told to take thyroid medication but this was stopped by her PCP in 1/16 as TSH was normal  Lab Results  Component Value Date   TSH 3.74 06/29/2014       The blood pressure has been high for 15 years treated with multiple medications, currently Benicar HCT and Procardia     No swelling of feet.     No shortness of breath or chest tightness on exertion.     Bowel habits: Normal.  Occasionally may have a cramp in the left upper abdomen      No joint  pains.          No history of Numbness, tingling or burning in feet       She has a history of allergic rhinitis taking Flonase     No complaints of hot flashes.   LABS:  No visits with results within 1 Week(s) from this visit. Latest known visit with results is:  Office Visit on 06/29/2014  Component Date Value Ref Range Status  . Sodium 06/29/2014 140  135 - 145 mEq/L Final  . Potassium 06/29/2014 3.7  3.5 - 5.1 mEq/L Final  . Chloride 06/29/2014 103  96 - 112 mEq/L Final  . CO2 06/29/2014 29  19 - 32 mEq/L Final  . Glucose, Bld 06/29/2014 158* 70 - 99 mg/dL Final  . BUN 40/98/1191 15  6 - 23 mg/dL Final  . Creatinine, Ser 06/29/2014 0.73  0.40 - 1.20 mg/dL Final  . Total Bilirubin 06/29/2014 0.3  0.2 - 1.2 mg/dL Final  . Alkaline Phosphatase 06/29/2014 92  39 - 117 U/L Final  . AST 06/29/2014 26  0 - 37 U/L Final  . ALT 06/29/2014 23  0 - 35 U/L Final  . Total Protein 06/29/2014 7.2  6.0 - 8.3 g/dL Final  . Albumin 47/82/9562 4.0  3.5 - 5.2 g/dL Final  . Calcium 13/01/6577 9.2  8.4 - 10.5 mg/dL Final  . GFR 46/96/2952 84.44  >60.00 mL/min Final  . WBC 06/29/2014 5.3  4.0 - 10.5 K/uL Final  . RBC 06/29/2014 3.84* 3.87 - 5.11 Mil/uL Final  . Hemoglobin 06/29/2014 11.0* 12.0 - 15.0 g/dL Final  . HCT 84/13/2440 33.3* 36.0 - 46.0 % Final  . MCV 06/29/2014 86.7  78.0 - 100.0 fl Final  . MCHC 06/29/2014 33.0  30.0 - 36.0 g/dL Final  . RDW  04/05/2535 13.9  11.5 - 15.5 % Final  . Platelets 06/29/2014 299.0  150.0 - 400.0 K/uL Final  . Neutrophils Relative % 06/29/2014 59.9  43.0 - 77.0 % Final  . Lymphocytes Relative 06/29/2014 31.7  12.0 - 46.0 % Final  . Monocytes Relative 06/29/2014 6.5  3.0 - 12.0 % Final  . Eosinophils Relative 06/29/2014 1.5  0.0 - 5.0 % Final  . Basophils Relative 06/29/2014 0.4  0.0 - 3.0 % Final  . Neutro Abs 06/29/2014 3.1  1.4 - 7.7 K/uL Final  . Lymphs Abs 06/29/2014 1.7  0.7 - 4.0 K/uL Final  . Monocytes Absolute 06/29/2014 0.3  0.1 - 1.0 K/uL Final  . Eosinophils Absolute 06/29/2014 0.1  0.0 - 0.7 K/uL Final  . Basophils Absolute 06/29/2014 0.0  0.0 -  0.1 K/uL Final  . TSH 06/29/2014 3.74  0.35 - 4.50 uIU/mL Final  . Cholesterol 06/29/2014 206* 0 - 200 mg/dL Final   ATP III Classification       Desirable:  < 200 mg/dL               Borderline High:  200 - 239 mg/dL          High:  > = 578 mg/dL  . Triglycerides 06/29/2014 100.0  0.0 - 149.0 mg/dL Final   Normal:  <469 mg/dLBorderline High:  150 - 199 mg/dL  . HDL 06/29/2014 51.70  >39.00 mg/dL Final  . VLDL 62/95/2841 20.0  0.0 - 40.0 mg/dL Final  . LDL Cholesterol 06/29/2014 134* 0 - 99 mg/dL Final  . Total CHOL/HDL Ratio 06/29/2014 4   Final                  Men          Women1/2 Average Risk     3.4          3.3Average Risk          5.0          4.42X Average Risk          9.6          7.13X Average Risk          15.0          11.0                      . NonHDL 06/29/2014 154.30   Final   NOTE:  Non-HDL goal should be 30 mg/dL higher than patient's LDL goal (i.e. LDL goal of < 70 mg/dL, would have non-HDL goal of < 100 mg/dL)  . Hgb A1c MFr Bld 06/29/2014 9.4* 4.6 - 6.5 % Final   Glycemic Control Guidelines for People with Diabetes:Non Diabetic:  <6%Goal of Therapy: <7%Additional Action Suggested:  >8%     Physical Examination:  BP 134/78 mmHg  Temp(Src) 98.9 F (37.2 C) (Oral)  Ht  (1.549 m)  Wt 181 lb 3.2 oz (82.192 kg)  BMI  34.26 kg/m2  GENERAL:         Patient has generalized obesity.   HEENT:         Eye exam shows normal external appearance. Fundus exam shows laser scars or exudates on the left side, right side appears relatively normal. Oral exam shows normal mucosa .  NECK:         General:  Neck exam shows no lymphadenopathy. Carotids are normal to palpation and no bruit heard.  Thyroid is not enlarged and no nodules felt.   LUNGS:         Chest is symmetrical. Lungs are clear to auscultation.Marland Kitchen   HEART:         Heart sounds:  S1 and S2 are normal. No murmurs or clicks heard., no S3 or S4.   ABDOMEN:   There is no distention present. Liver and spleen are not palpable. No other mass or tenderness present.  EXTREMITIES:     There is no edema. No skin lesions present.Marland Kitchen  NEUROLOGICAL:   Vibration sense is mildly reduced in toes. Ankle jerks are absent bilaterally, biceps reflexes are normal .  Diabetic foot exam shows normal monofilament sensation in the toes and plantar surfaces, no skin lesions or ulcers on the feet and normal pedal pulses  MUSCULOSKELETAL:       There is no enlargement or deformity of the joints. Spine is normal to inspection.Marland Kitchen   SKIN:       No rash or lesions of concern.        ASSESSMENT:  Diabetes type 2, uncontrolled with obesity and BMI 34 She has poor control with A1c of 9.4%. She is insulin resistant and even with 50 units of insulin a day her blood sugars are poorly controlled Currently not getting coverage for her meals with significantly high postprandial readings She does not understand the actions of premixed insulin and is taking this at bedtime instead of before supper time Also most likely not getting enough metformin since she is taking only 500 mg twice a day even though her prescription calls for 1000 mg twice a day  Overall she is trying to do fairly well with her diet but is not exercising consistently during winter months at least    Complications:?   Retinopathy.  No evidence of neuropathy on exam.  Needs follow-up eye exam this year  Hypertension: Appears well controlled  Hyperlipidemia: LDL is still significantly high with her 20 mg Lipitor regimen, followed by PCP  Mild anemia  ?  History of thyroid disease.  Currently not on any supplements  PLAN:   Discussed with her that since her diabetes is difficult to control she will need to be on a basal bolus insulin regimen.  Since she has Levemir currently at home she can start back on this in the evening for basal insulin.  Discussed how basal insulin works and adjustment based on fasting readings  She will start mealtime insulin with regular insulin which will be an expensive, she prefers a syringe and vial instead of a pen for cost.  Discussed timing and action of regular insulin, adjustment based on fasting readings and potential for hypoglycemia for which the dose may need to be reduced.  Encouraged her to walk regularly  She will follow-up with diabetes educator in 1 week to continue diabetes education and fine-tune insulin dose  She was shown the V-go pump and she will check on the insurance coverage of this.  If blood sugars are not well-controlled and consider using this on her next visit.  Consider consultation with dietitian  Increase metformin/glipizide to 2 tablets twice a day Translation of instructions given in Spanish and patient instructions reviewed in detail  Counseling time over 50% of today's 60 minute visit  Patient Instructions  Please check blood sugars at least half the time about 2 hours after a different meal everyday and 3-4 times per week on waking up.  Please bring blood sugar monitor to each visit.  Recommended blood sugar levels about 2 hours after meal is 140-180 and on waking up 90-130  GLIPIZIDE/metformin: Take 2 tablets after breakfast and 2 tablets after dinner.  If having any diarrhea or stomach discomfort may reduce to 3 tablets a  day.  Stop taking the 70/30 insulin  LEVEMIR insulin: Start taking 35 units in the evening either at dinnertime or bedtime, whichever is easier to remember.  REGULAR INSULIN: This this will need to be taken 20-30 minutes BEFORE eating. Start taking 10 units before breakfast, 12 units before lunch and 14 units before dinner If the blood sugar is getting low/below 100 within 4 hours of taking the insulin may need to reduce the dose by 2 units However if the blood sugar 2 hours later is still over 200 may need to  increase the dose by 2 units  Try to walk at least 5 to 15 minutes every day       [facility] Memorial HospitalKUMAR,Abrahim Sargent 08/07/2014, 9:04 AM   Note: This office note was prepared with Insurance underwriterDragon voice recognition system technology. Any transcriptional errors that result from this process are unintentional.

## 2014-08-07 NOTE — Patient Instructions (Signed)
Please check blood sugars at least half the time about 2 hours after a different meal everyday and 3-4 times per week on waking up.  Please bring blood sugar monitor to each visit.  Recommended blood sugar levels about 2 hours after meal is 140-180 and on waking up 90-130  GLIPIZIDE/metformin: Take 2 tablets after breakfast and 2 tablets after dinner.  If having any diarrhea or stomach discomfort may reduce to 3 tablets a day.  Stop taking the 70/30 insulin  LEVEMIR insulin: Start taking 35 units in the evening either at dinnertime or bedtime, whichever is easier to remember.  REGULAR INSULIN: This this will need to be taken 20-30 minutes BEFORE eating. Start taking 10 units before breakfast, 12 units before lunch and 14 units before dinner If the blood sugar is getting low/below 100 within 4 hours of taking the insulin may need to reduce the dose by 2 units However if the blood sugar 2 hours later is still over 200 may need to increase the dose by 2 units  Try to walk at least 5 to 15 minutes every day

## 2014-08-11 ENCOUNTER — Emergency Department (HOSPITAL_BASED_OUTPATIENT_CLINIC_OR_DEPARTMENT_OTHER): Payer: 59

## 2014-08-11 ENCOUNTER — Encounter (HOSPITAL_BASED_OUTPATIENT_CLINIC_OR_DEPARTMENT_OTHER): Payer: Self-pay

## 2014-08-11 ENCOUNTER — Observation Stay (HOSPITAL_BASED_OUTPATIENT_CLINIC_OR_DEPARTMENT_OTHER)
Admission: EM | Admit: 2014-08-11 | Discharge: 2014-08-12 | Disposition: A | Discharge status: Home / Self Care | Payer: 59 | Attending: Internal Medicine | Admitting: Internal Medicine

## 2014-08-11 ENCOUNTER — Ambulatory Visit (INDEPENDENT_AMBULATORY_CARE_PROVIDER_SITE_OTHER): Payer: 59 | Admitting: Medical

## 2014-08-11 ENCOUNTER — Other Ambulatory Visit: Payer: Self-pay

## 2014-08-11 DIAGNOSIS — E785 Hyperlipidemia, unspecified: Secondary | ICD-10-CM | POA: Diagnosis not present

## 2014-08-11 DIAGNOSIS — I10 Essential (primary) hypertension: Secondary | ICD-10-CM | POA: Diagnosis not present

## 2014-08-11 DIAGNOSIS — R079 Chest pain, unspecified: Principal | ICD-10-CM | POA: Insufficient documentation

## 2014-08-11 DIAGNOSIS — R0789 Other chest pain: Secondary | ICD-10-CM

## 2014-08-11 DIAGNOSIS — E119 Type 2 diabetes mellitus without complications: Secondary | ICD-10-CM | POA: Diagnosis not present

## 2014-08-11 DIAGNOSIS — H539 Unspecified visual disturbance: Secondary | ICD-10-CM | POA: Insufficient documentation

## 2014-08-11 LAB — HEPATIC FUNCTION PANEL
ALT: 20 U/L (ref 0–35)
AST: 23 U/L (ref 0–37)
Albumin: 3.7 g/dL (ref 3.5–5.2)
Alkaline Phosphatase: 76 U/L (ref 39–117)
BILIRUBIN TOTAL: 0.5 mg/dL (ref 0.3–1.2)
Bilirubin, Direct: <0.1 mg/dL (ref 0.0–0.5)
Total Protein: 7 g/dL (ref 6.0–8.3)

## 2014-08-11 LAB — CBC
HCT: 32.1 % — ABNORMAL LOW (ref 36.0–46.0)
HEMATOCRIT: 36.6 % (ref 36.0–46.0)
HEMOGLOBIN: 10.5 g/dL — AB (ref 12.0–15.0)
Hemoglobin: 11.7 g/dL — ABNORMAL LOW (ref 12.0–15.0)
MCH: 28.3 pg (ref 26.0–34.0)
MCH: 28.7 pg (ref 26.0–34.0)
MCHC: 32 g/dL (ref 30.0–36.0)
MCHC: 32.7 g/dL (ref 30.0–36.0)
MCV: 88.6 fL (ref 78.0–100.0)
MCV: 87.7 fL (ref 78.0–100.0)
Platelets: 308 10*3/uL (ref 150–400)
Platelets: 304 10*3/uL (ref 150–400)
RBC: 4.13 MIL/uL (ref 3.87–5.11)
RBC: 3.66 MIL/uL — ABNORMAL LOW (ref 3.87–5.11)
RDW: 13 % (ref 11.5–15.5)
RDW: 13.2 % (ref 11.5–15.5)
WBC: 7.6 10*3/uL (ref 4.0–10.5)
WBC: 7 10*3/uL (ref 4.0–10.5)

## 2014-08-11 LAB — BASIC METABOLIC PANEL
Anion gap: 4 — ABNORMAL LOW (ref 5–15)
BUN: 17 mg/dL (ref 6–23)
CO2: 28 mmol/L (ref 19–32)
CREATININE: 0.67 mg/dL (ref 0.50–1.10)
Calcium: 8.9 mg/dL (ref 8.4–10.5)
Chloride: 105 mmol/L (ref 96–112)
GFR, EST NON AFRICAN AMERICAN: 89 mL/min — AB (ref >90)
Glucose, Bld: 124 mg/dL — ABNORMAL HIGH (ref 70–99)
POTASSIUM: 3.5 mmol/L (ref 3.5–5.1)
Sodium: 137 mmol/L (ref 135–145)

## 2014-08-11 LAB — CREATININE, SERUM
Creatinine, Ser: 0.78 mg/dL (ref 0.50–1.10)
GFR calc Af Amer: >90 mL/min (ref >90)
GFR calc non Af Amer: 85 mL/min — ABNORMAL LOW (ref >90)

## 2014-08-11 LAB — APTT: APTT: 36 s (ref 24–37)

## 2014-08-11 LAB — TSH: TSH: 4.753 u[IU]/mL — AB (ref 0.350–4.500)

## 2014-08-11 LAB — BRAIN NATRIURETIC PEPTIDE: B Natriuretic Peptide: 19.6 pg/mL (ref 0.0–100.0)

## 2014-08-11 LAB — TROPONIN I: Troponin I: 0.04 ng/mL — ABNORMAL HIGH (ref <0.031)

## 2014-08-11 LAB — GLUCOSE, CAPILLARY: Glucose-Capillary: 266 mg/dL — ABNORMAL HIGH (ref 70–99)

## 2014-08-11 MED ORDER — IRBESARTAN 150 MG PO TABS
300.0000 mg | ORAL_TABLET | Freq: Every day | ORAL | Status: DC
  Administered 2014-08-12: 300 mg via ORAL
  Filled 2014-08-11 (×3): qty 2

## 2014-08-11 MED ORDER — INSULIN DETEMIR 100 UNIT/ML ~~LOC~~ SOLN
30.0000 [IU] | Freq: Every day | SUBCUTANEOUS | Status: DC
  Administered 2014-08-12: 30 [IU] via SUBCUTANEOUS
  Filled 2014-08-11: qty 0.3

## 2014-08-11 MED ORDER — HYDROCHLOROTHIAZIDE 12.5 MG PO CAPS
12.5000 mg | ORAL_CAPSULE | Freq: Every day | ORAL | Status: DC
  Administered 2014-08-12: 12.5 mg via ORAL
  Filled 2014-08-11 (×2): qty 1

## 2014-08-11 MED ORDER — NITROGLYCERIN 0.4 MG SL SUBL
0.4000 mg | SUBLINGUAL_TABLET | SUBLINGUAL | Status: DC | PRN
Start: 2014-08-11 — End: 2014-08-11
  Administered 2014-08-11: 0.4 mg via SUBLINGUAL
  Filled 2014-08-11: qty 1

## 2014-08-11 MED ORDER — INSULIN ASPART 100 UNIT/ML ~~LOC~~ SOLN
0.0000 [IU] | Freq: Three times a day (TID) | SUBCUTANEOUS | Status: DC
  Administered 2014-08-12 (×2): 2 [IU] via SUBCUTANEOUS

## 2014-08-11 MED ORDER — MORPHINE SULFATE 2 MG/ML IJ SOLN: 2.0000 mg | INTRAMUSCULAR | Status: DC | PRN

## 2014-08-11 MED ORDER — GLIPIZIDE 5 MG PO TABS
5.0000 mg | ORAL_TABLET | Freq: Two times a day (BID) | ORAL | Status: DC
  Administered 2014-08-12: 5 mg via ORAL
  Filled 2014-08-11: qty 1

## 2014-08-11 MED ORDER — ASPIRIN 81 MG PO CHEW: 324.0000 mg | CHEWABLE_TABLET | Freq: Once | ORAL | Status: DC

## 2014-08-11 MED ORDER — ATORVASTATIN CALCIUM 20 MG PO TABS
20.0000 mg | ORAL_TABLET | Freq: Every day | ORAL | Status: DC
Start: 2014-08-11 — End: 2014-08-12
  Administered 2014-08-11: 20 mg via ORAL
  Filled 2014-08-11 (×2): qty 1

## 2014-08-11 MED ORDER — ASPIRIN EC 325 MG PO TBEC
325.0000 mg | DELAYED_RELEASE_TABLET | Freq: Every day | ORAL | Status: DC
  Administered 2014-08-12: 325 mg via ORAL
  Filled 2014-08-11 (×3): qty 1

## 2014-08-11 MED ORDER — ACETAMINOPHEN 325 MG PO TABS: 650.0000 mg | ORAL_TABLET | ORAL | Status: DC | PRN

## 2014-08-11 MED ORDER — INSULIN DETEMIR 100 UNIT/ML FLEXPEN: 30.0000 [IU] | PEN_INJECTOR | Freq: Every morning | SUBCUTANEOUS | Status: DC

## 2014-08-11 MED ORDER — ACETAMINOPHEN 325 MG PO TABS
650.0000 mg | ORAL_TABLET | Freq: Once | ORAL | Status: AC
  Administered 2014-08-11: 650 mg via ORAL
  Filled 2014-08-11: qty 2

## 2014-08-11 MED ORDER — OLMESARTAN MEDOXOMIL-HCTZ 40-12.5 MG PO TABS: 1.0000 | ORAL_TABLET | Freq: Every day | ORAL | Status: DC

## 2014-08-11 MED ORDER — SODIUM CHLORIDE 0.9 % IV SOLN
1000.0000 mL | INTRAVENOUS | Status: DC
  Administered 2014-08-11: 1000 mL via INTRAVENOUS

## 2014-08-11 MED ORDER — ENOXAPARIN SODIUM 40 MG/0.4ML ~~LOC~~ SOLN
40.0000 mg | SUBCUTANEOUS | Status: DC
  Administered 2014-08-11: 40 mg via SUBCUTANEOUS
  Filled 2014-08-11: qty 0.4

## 2014-08-11 MED ORDER — NIFEDIPINE ER 60 MG PO TB24
60.0000 mg | ORAL_TABLET | Freq: Every day | ORAL | Status: DC
  Administered 2014-08-12: 60 mg via ORAL
  Filled 2014-08-11 (×2): qty 1

## 2014-08-11 MED ORDER — NITROGLYCERIN 2 % TD OINT
1.0000 [in_us] | TOPICAL_OINTMENT | Freq: Four times a day (QID) | TRANSDERMAL | Status: DC
  Administered 2014-08-11 – 2014-08-12 (×3): 1 [in_us] via TOPICAL
  Filled 2014-08-11: qty 1
  Filled 2014-08-11: qty 30

## 2014-08-11 MED ORDER — ASPIRIN 81 MG PO CHEW
324.0000 mg | CHEWABLE_TABLET | Freq: Once | ORAL | Status: AC
  Administered 2014-08-11: 324 mg via ORAL
  Filled 2014-08-11: qty 4

## 2014-08-11 MED ORDER — ONDANSETRON HCL 4 MG/2ML IJ SOLN: 4.0000 mg | Freq: Four times a day (QID) | INTRAMUSCULAR | Status: DC | PRN

## 2014-08-11 NOTE — Assessment & Plan Note (Signed)
Also you left eye new vision symptoms may also warrant ED work up. Possible ct of head may be done.

## 2014-08-11 NOTE — Assessment & Plan Note (Signed)
With your chest pressure for 3  Nights and  Possible new  left bundle branch block(but no prior ekg to compare), I do want you evaluated in the ED

## 2014-08-11 NOTE — Patient Instructions (Signed)
With your chest pressure for 3  Nights and  Possible new  left bundle branch block(but no prior ekg to compare), I do want you evaluated in the ED.  Also you left eye new vision symptoms may also warrant ED work up.  I have talked with Charge nurse and informed her of your recent signs and symptoms.  Follow up in 7 days or as needed.

## 2014-08-11 NOTE — H&P (Signed)
[facility] History and Physical  Cynthia CrumbCecilia Belzenie de Mata UJW:[id number]:[id number] DOB: 11/13/1945 DOA: 08/11/2014  Referring physician: ER physician. PCP: [name], Edward, PA-C  Chief Complaint: Chest pain.  HPI: Cynthia IgoCecilia Belzenie de Cynthia Mata is a 69 y.o. female with history of diabetes mellitus type 2, hypertension, hyperlipidemia was referred to the ER after patient was complaining of chest pain. Patient has been having chest pain only in the nights when she sleeps or when she wakes up. Patient feels like a pressure squeezing type retrosternal nonradiating with no associated shortness of breath palpitations diaphoresis nausea vomiting. EKG was showing normal sinus rhythm with LBBB with no old EKG to compare. Patient's initial troponin was positive. On my exam patient is resting comfortably with no chest pain. Patient states that last year when she was in [place] and was told to have right ankle surgery she was told that she had some heart problem but her 2-D echo turned out to be normal.   Review of Systems: As presented in the history of presenting illness, rest negative.  Past Medical History  Diagnosis Date  . Hypertension   . Diabetes mellitus without complication   . Macular degeneration   . Anemia   . Cataract   . Sleep apnea   . Hyperlipidemia   . Osteoporosis   . Thyroid disease    Past Surgical History  Procedure Laterality Date  . Ankle fracture surgery      Right ankle  . Tonsillectomy      Abcess removed also   Social History:  reports that she has never smoked. She does not have any smokeless tobacco history on file. She reports that she does not drink alcohol or use illicit drugs. Where does patient live home. Can patient participate in ADLs? Yes.  No Known Allergies  Family History:  Family History  Problem Relation Age of Onset  . Hypertension Maternal Aunt   . Diabetes Neg Hx   . CAD Maternal Grandfather       Prior to Admission medications   Medication  Sig Start Date End Date Taking? Authorizing Provider  atorvastatin (LIPITOR) 20 MG tablet Take 1 tablet (20 mg total) by mouth daily. 06/29/14   Bayard BeaverEdward M Saguier, PA-C  fluticasone (FLONASE) 50 MCG/ACT nasal spray Place 2 sprays into both nostrils daily. Patient not taking: Reported on 08/07/2014 06/29/14   Bayard BeaverEdward M Saguier, PA-C  glipiZIDE-metformin (METAGLIP) 5-500 MG per tablet Take 2 tablets by mouth 2 (two) times daily before a meal. 07/27/14   Bayard BeaverEdward M Saguier, PA-C  glucose blood test strip Use as instructed 07/20/14   Bayard BeaverEdward M Saguier, PA-C  Insulin Detemir 100 UNIT/ML SOPN Inject 30 Units into the skin every morning. 04/15/13   Linna HoffJames D Kindl, MD  Insulin Pen Needle 31G X 8 MM MISC 1 Device by Does not apply route 2 (two) times daily. 07/20/14   Bayard BeaverEdward M Saguier, PA-C  insulin regular (HUMULIN R) 100 units/mL injection Take 20-30 minutes before eating.  Take 10 units at breakfast, 12 units at lunch, and 14 units before dinner. 08/07/14   Reather LittlerAjay Kumar, MD  Lancets [facility](ONETOUCH ULTRASOFT) lancets Use as instructed 07/20/14   Bayard BeaverEdward M Saguier, PA-C  NIFEdipine (PROCARDIA XL/ADALAT-CC) 60 MG 24 hr tablet Take 60 mg by mouth daily.    Historical Provider, MD  olmesartan-hydrochlorothiazide (BENICAR HCT) 40-12.5 MG per tablet Take 1 tablet by mouth daily. 05/31/13   Doris Cheadleeepak Advani, MD  olmesartan-hydrochlorothiazide (BENICAR HCT) 40-12.5 MG per tablet Take 1 tablet by mouth  daily. 07/20/14   Bayard Beaver Saguier, PA-C    Physical Exam: Filed Vitals:   08/11/14 1700 08/11/14 1730 08/11/14 1800 08/11/14 1912  BP: 134/72 130/63 130/65 142/80  Pulse: 79 81 77 95  Temp:    98.4 F (36.9 C)  TempSrc:    Oral  Resp: Height:    5' 0.63" (1.54 m)  Weight:    80.1 kg (176 lb 9.4 oz)  SpO2: 96% 97% 95% 98%     General:  Well-developed and nourished.  Eyes: Anicteric no pallor.  ENT: No discharge from the ears eyes nose and mouth.  Neck: No mass felt.  Cardiovascular: S1-S2  heard.  Respiratory: No rhonchi or crepitations.  Abdomen: Soft nontender bowel sounds present.  Skin: No rash.  Musculoskeletal: No edema.  Psychiatric: Appears normal.  Neurologic: Alert awake oriented to time place and person. Moves all extremities.  Labs on Admission:  Basic Metabolic Panel:  Recent Labs Lab 08/11/14 1325  NA 137  K 3.5  CL 105  CO2 28  GLUCOSE 124*  BUN 17  CREATININE 0.67  CALCIUM 8.9   Liver Function Tests: No results for input(s): AST, ALT, ALKPHOS, BILITOT, PROT, ALBUMIN in the last 168 hours. No results for input(s): LIPASE, AMYLASE in the last 168 hours. No results for input(s): AMMONIA in the last 168 hours. CBC:  Recent Labs Lab 08/11/14 1325  WBC 7.6  HGB 11.7*  HCT 36.6  MCV 88.6  PLT 308   Cardiac Enzymes:  Recent Labs Lab 08/11/14 1325  TROPONINI 0.04*    BNP (last 3 results)  Recent Labs  08/11/14 1325  BNP 19.6    ProBNP (last 3 results) No results for input(s): PROBNP in the last 8760 hours.  CBG: No results for input(s): GLUCAP in the last 168 hours.  Radiological Exams on Admission: Dg Chest Port 1 View  08/11/2014   CLINICAL DATA:  Chest pain, shortness of breath.  EXAM: PORTABLE CHEST - 1 VIEW  COMPARISON:  None.  FINDINGS: The heart size and mediastinal contours are within normal limits. Both lungs are clear. No pneumothorax or pleural effusion is noted. The visualized skeletal structures are unremarkable.  IMPRESSION: No acute cardiopulmonary abnormality seen.   Electronically Signed   By: Lupita Raider, M.D.   On: 08/11/2014 13:54    EKG: Independently reviewed. Normal sinus rhythm with LBBB.  Assessment/Plan Principal Problem:   Chest pain Active Problems:   Essential hypertension, benign   Hyperlipidemia   Diabetes mellitus type 2, controlled   Pain in the chest   1. Chest pain - given the history of diabetes hypertension hyperlipidemia and LBBB with initial troponins being positive we  will cycle cardiac markers to rule out ACS. Patient has been placed on aspirin and continue statins. Nothing by mouth after 5 AM in anticipation of possible cardiac procedure. Check 2-D echo. 2. Diabetes mellitus type 2 controlled - continue home medications with sliding scale coverage. 3. Hypertension - continue home medications. 4. Hyperlipidemia on statins. 5. Mild anemia - is there is no significant fall in hemoglobin and further workup as outpatient.   DVT Prophylaxis Lovenox.  Code Status: Full code.  Family Communication: None.  Disposition Plan: Admit for observation.    Brendia Dampier N. [facility] Pager [phone].  If 7PM-7AM, please contact night-coverage [email or web address] Password [email or web address] 08/11/2014, 8:01 PM

## 2014-08-11 NOTE — ED Provider Notes (Signed)
CSN: [id number]     Arrival date & time 08/11/14  1232 History   First MD Initiated Contact with Patient 08/11/14 1313     Chief Complaint  Patient presents with  . Chest Pain   Patient is a 69 y.o. female presenting with chest pain. The history is provided by the patient.  Chest Pain Pain location:  Substernal area Pain quality: pressure   Duration:  3 days Timing:  Intermittent Context: no movement   Context comment:  She only notices at night when sleeping and when she wakes up in the am.  When walking around during the day she is fine. Associated symptoms: cough, nausea and shortness of breath   Associated symptoms: no abdominal pain and no fever   Associated symptoms comment:  She feels like something coming up from her stomach and she coughs.   Past Medical History  Diagnosis Date  . Hypertension   . Diabetes mellitus without complication   . Macular degeneration   . Anemia   . Cataract   . Sleep apnea   . Hyperlipidemia   . Osteoporosis   . Thyroid disease    Past Surgical History  Procedure Laterality Date  . Ankle fracture surgery      Right ankle  . Tonsillectomy      Abcess removed also   Family History  Problem Relation Age of Onset  . Hypertension Maternal Aunt   . Diabetes Neg Hx    History  Substance Use Topics  . Smoking status: Never Smoker   . Smokeless tobacco: Not on file  . Alcohol Use: No   OB History    No data available     Review of Systems  Constitutional: Negative for fever.  Respiratory: Positive for cough and shortness of breath.   Cardiovascular: Positive for chest pain.  Gastrointestinal: Positive for nausea. Negative for abdominal pain.      Allergies  Review of patient's allergies indicates no known allergies.  Home Medications   Prior to Admission medications   Medication Sig Start Date End Date Taking? Authorizing Provider  atorvastatin (LIPITOR) 20 MG tablet Take 1 tablet (20 mg total) by mouth daily. 06/29/14    Bayard BeaverEdward M Saguier, PA-C  fluticasone (FLONASE) 50 MCG/ACT nasal spray Place 2 sprays into both nostrils daily. Patient not taking: Reported on 08/07/2014 06/29/14   Bayard BeaverEdward M Saguier, PA-C  glipiZIDE-metformin (METAGLIP) 5-500 MG per tablet Take 2 tablets by mouth 2 (two) times daily before a meal. 07/27/14   Bayard BeaverEdward M Saguier, PA-C  glucose blood test strip Use as instructed 07/20/14   Bayard BeaverEdward M Saguier, PA-C  Insulin Detemir 100 UNIT/ML SOPN Inject 30 Units into the skin every morning. 04/15/13   Linna HoffJames D Kindl, MD  Insulin Pen Needle 31G X 8 MM MISC 1 Device by Does not apply route 2 (two) times daily. 07/20/14   Bayard BeaverEdward M Saguier, PA-C  insulin regular (HUMULIN R) 100 units/mL injection Take 20-30 minutes before eating.  Take 10 units at breakfast, 12 units at lunch, and 14 units before dinner. 08/07/14   Reather LittlerAjay Kumar, MD  Lancets [facility](ONETOUCH ULTRASOFT) lancets Use as instructed 07/20/14   Bayard BeaverEdward M Saguier, PA-C  NIFEdipine (PROCARDIA XL/ADALAT-CC) 60 MG 24 hr tablet Take 60 mg by mouth daily.    Historical Provider, MD  olmesartan-hydrochlorothiazide (BENICAR HCT) 40-12.5 MG per tablet Take 1 tablet by mouth daily. 05/31/13   Doris Cheadleeepak Advani, MD  olmesartan-hydrochlorothiazide (BENICAR HCT) 40-12.5 MG per tablet Take 1 tablet by mouth  daily. 07/20/14   Bayard Beaver Saguier, PA-C   BP 152/83 mmHg  Pulse 83  Temp(Src) 98.2 F (36.8 C) (Oral)  Resp 18  Wt 181 lb 3.2 oz (82.192 kg)  SpO2 99% Physical Exam  Constitutional: She appears well-developed and well-nourished. No distress.  HENT:  Head: Normocephalic and atraumatic.  Right Ear: External ear normal.  Left Ear: External ear normal.  Eyes: Conjunctivae are normal. Right eye exhibits no discharge. Left eye exhibits no discharge. No scleral icterus.  Neck: Neck supple. No tracheal deviation present.  Cardiovascular: Normal rate, regular rhythm and intact distal pulses.   Pulmonary/Chest: Effort normal and breath sounds normal. No stridor. No respiratory  distress. She has no wheezes. She has no rales.  Abdominal: Soft. Bowel sounds are normal. She exhibits no distension. There is no tenderness. There is no rebound and no guarding.  Musculoskeletal: She exhibits no edema or tenderness.  Neurological: She is alert. She has normal strength. No cranial nerve deficit (no facial droop, extraocular movements intact, no slurred speech) or sensory deficit. She exhibits normal muscle tone. She displays no seizure activity. Coordination normal.  Skin: Skin is warm and dry. No rash noted.  Psychiatric: She has a normal mood and affect.  Nursing note and vitals reviewed.   ED Course  Procedures (including critical care time) Labs Review Labs Reviewed  CBC - Abnormal; Notable for the following:    Hemoglobin 11.7 (*)    All other components within normal limits  BASIC METABOLIC PANEL - Abnormal; Notable for the following:    Glucose, Bld 124 (*)    GFR calc non Af Amer 89 (*)    Anion gap 4 (*)    All other components within normal limits  TROPONIN I - Abnormal; Notable for the following:    Troponin I 0.04 (*)    All other components within normal limits  BRAIN NATRIURETIC PEPTIDE  APTT    Imaging Review Dg Chest Port 1 View  08/11/2014   CLINICAL DATA:  Chest pain, shortness of breath.  EXAM: PORTABLE CHEST - 1 VIEW  COMPARISON:  None.  FINDINGS: The heart size and mediastinal contours are within normal limits. Both lungs are clear. No pneumothorax or pleural effusion is noted. The visualized skeletal structures are unremarkable.  IMPRESSION: No acute cardiopulmonary abnormality seen.   Electronically Signed   By: Lupita Raider, M.D.   On: 08/11/2014 13:54     EKG Interpretation   Date/Time:  Friday August 11 2014 13:35:21 EST Ventricular Rate:  77 PR Interval:  156 QRS Duration: 148 QT Interval:  430 QTC Calculation: 486 R Axis:   54 Text Interpretation:  Normal sinus rhythm Left bundle branch block  Abnormal ECG No old tracing to  compare Confirmed by Revere Maahs  MD-J, Nedim Oki  ([id number]) on 08/11/2014 2:25:15 PM      MDM   Final diagnoses:  Chest pain, unspecified chest pain type    The patient's symptoms are atypical in that it seems to be worse at night and better and she is walking around. Symptoms almost suggestive of gastroesophageal reflux disease. However the patient does have cardiac risk factors. G shows a bundle branch block. Her first troponin was above normal. The medical service for admission and serial cardiac enzymes. She may benefit from additional cardiac testing.    Linwood Dibbles, MD 08/11/14 [phone]

## 2014-08-11 NOTE — ED Notes (Signed)
Pt called for nurse.  Sts she is feeling something different in her chest.  Cannot describe it but made her afraid so she called for nurse.  VSS.  Getting repeat EKG at this time.  Skin W&D.  Pt denies return of chest pressure.

## 2014-08-11 NOTE — Progress Notes (Signed)
Pre visit review using our clinic review tool, if applicable. No additional management support is needed unless otherwise documented below in the visit note. 

## 2014-08-11 NOTE — ED Notes (Signed)
Pt reports 2 days of intermittent "pressure" in substernal area, no radiation, intermittent sob.  No additional symptoms.  Also reports some visual disturbances for 2 days in L eye, has beginnings of macular degeneration, had treatments in [place] same, laser surgery with clot removal.  Again seeing red spots which is same symptom she saw previously with macular degeneration.

## 2014-08-11 NOTE — Progress Notes (Signed)
Subjective:    Patient ID: Cynthia Mata, female    DOB: 06/06/1946, [age] y.o.   MRN: [id number]  HPI   Pt in pressure on chest for 3 nights when lies flat all night long each night. This am no pain.  Also some shortness of breath walking yesterday.. Also some shoulder pain rt side but rt shoulder is chronic. No jaw pain.  No ekg done in the past.  Also thursda when she woke up she had rt side redness in visual field with some v shaped redness that appeared to descend downnward  in her visual field. This last morning to noon. Today she has spots in her visual file. Presently on and off black and red spots. Some pressure in head. Pulsating sensation. No ha.      Review of Systems  Constitutional: Negative for fever, chills and fatigue.  Eyes:       Lt eye spots in visual field.  Respiratory: Positive for shortness of breath. Negative for chest tightness and wheezing.        At first she said some shortness of breath walking today then states was yesterday.  Cardiovascular: Negative for chest pain.       Chest pressure last 3 nights.  Musculoskeletal: Negative for back pain.  Neurological: Negative for dizziness, syncope, facial asymmetry, speech difficulty, weakness, numbness and headaches.  Hematological: Negative for adenopathy. Does not bruise/bleed easily.  Psychiatric/Behavioral: Negative for behavioral problems.   Past Medical History  Diagnosis Date  . Hypertension   . Diabetes mellitus without complication   . Macular degeneration   . Anemia   . Cataract   . Sleep apnea   . Hyperlipidemia   . Osteoporosis   . Thyroid disease     History   Social History  . Marital Status: Married    Spouse Name: N/A  . Number of Children: N/A  . Years of Education: N/A   Occupational History  . Not on file.   Social History Main Topics  . Smoking status: Never Smoker   . Smokeless tobacco: Not on file  . Alcohol Use: No  . Drug Use: No  . Sexual Activity:  Not on file   Other Topics Concern  . Not on file   Social History Narrative    Past Surgical History  Procedure Laterality Date  . Ankle fracture surgery      Right ankle  . Tonsillectomy      Abcess removed also    Family History  Problem Relation Age of Onset  . Hypertension Maternal Aunt   . Diabetes Neg Hx     No Known Allergies  Current Outpatient Prescriptions on File Prior to Visit  Medication Sig Dispense Refill  . atorvastatin (LIPITOR) 20 MG tablet Take 1 tablet (20 mg total) by mouth daily. 30 tablet 3  . fluticasone (FLONASE) 50 MCG/ACT nasal spray Place 2 sprays into both nostrils daily. (Patient not taking: Reported on 08/07/2014) 16 g 1  . glipiZIDE-metformin (METAGLIP) 5-500 MG per tablet Take 2 tablets by mouth 2 (two) times daily before a meal. 120 tablet 1  . glucose blood test strip Use as instructed 100 each 12  . Insulin Detemir 100 UNIT/ML SOPN Inject 30 Units into the skin every morning. 3 pen 1  . Insulin Pen Needle 31G X 8 MM MISC 1 Device by Does not apply route 2 (two) times daily. 50 each 1  . insulin regular (HUMULIN R) 100 units/mL injection Take  20-30 minutes before eating.  Take 10 units at breakfast, 12 units at lunch, and 14 units before dinner. 10 mL 3  . Lancets (ONETOUCH ULTRASOFT) lancets Use as instructed 100 each 12  . NIFEdipine (PROCARDIA XL/ADALAT-CC) 60 MG 24 hr tablet Take 60 mg by mouth daily.    Marland Kitchen olmesartan-hydrochlorothiazide (BENICAR HCT) 40-12.5 MG per tablet Take 1 tablet by mouth daily. 30 tablet 1  . olmesartan-hydrochlorothiazide (BENICAR HCT) 40-12.5 MG per tablet Take 1 tablet by mouth daily. 30 tablet 11   No current facility-administered medications on file prior to visit.    There were no vitals taken for this visit.       Objective:   Physical Exam  General Mental Status- Alert. General Appearance- Not in acute distress.   Skin General: Color- Normal Color. Moisture- Normal Moisture.  Neck Carotid  Arteries- Normal color. Moisture- Normal Moisture. No carotid bruits. No JVD.  Chest and Lung Exam Auscultation: Breath Sounds:-Normal.  Cardiovascular Auscultation:Rythm- Regular. Murmurs & Other Heart Sounds:Auscultation of the heart reveals- No Murmurs.  Abdomen Inspection:-Inspeection Normal. Palpation/Percussion:Note:No mass. Palpation and Percussion of the abdomen reveal- Non Tender, Non Distended + BS, no rebound or guarding.    Neurologic Cranial Nerve exam:- CN III-XII intact(No nystagmus), symmetric smile. Drift Test:- No drift. Romberg Exam:- Negative.  Heal to Toe Gait exam:-Normal. Finger to Nose:- Normal/Intact Strength:- 5/5 equal and symmetric strength both upper and lower extremities.      Assessment & Plan:

## 2014-08-12 LAB — CBC WITH DIFFERENTIAL/PLATELET
BASOS ABS: 0 10*3/uL (ref 0.0–0.1)
BASOS PCT: 0 % (ref 0–1)
Eosinophils Absolute: 0.1 10*3/uL (ref 0.0–0.7)
Eosinophils Relative: 2 % (ref 0–5)
HCT: 31.4 % — ABNORMAL LOW (ref 36.0–46.0)
HEMOGLOBIN: 10.2 g/dL — AB (ref 12.0–15.0)
Lymphocytes Relative: 34 % (ref 12–46)
Lymphs Abs: 1.9 10*3/uL (ref 0.7–4.0)
MCH: 28.7 pg (ref 26.0–34.0)
MCHC: 32.5 g/dL (ref 30.0–36.0)
MCV: 88.2 fL (ref 78.0–100.0)
Monocytes Absolute: 0.3 10*3/uL (ref 0.1–1.0)
Monocytes Relative: 6 % (ref 3–12)
NEUTROS PCT: 58 % (ref 43–77)
Neutro Abs: 3.2 10*3/uL (ref 1.7–7.7)
PLATELETS: 288 10*3/uL (ref 150–400)
RBC: 3.56 MIL/uL — AB (ref 3.87–5.11)
RDW: 13.3 % (ref 11.5–15.5)
WBC: 5.5 10*3/uL (ref 4.0–10.5)

## 2014-08-12 LAB — LIPID PANEL
CHOLESTEROL: 146 mg/dL (ref 0–200)
HDL: 35 mg/dL — ABNORMAL LOW (ref >39)
LDL CALC: 75 mg/dL (ref 0–99)
Total CHOL/HDL Ratio: 4.2 RATIO
Triglycerides: 179 mg/dL — ABNORMAL HIGH (ref <150)
VLDL: 36 mg/dL (ref 0–40)

## 2014-08-12 LAB — BASIC METABOLIC PANEL
Anion gap: 9 (ref 5–15)
BUN: 14 mg/dL (ref 6–23)
CALCIUM: 8.9 mg/dL (ref 8.4–10.5)
CHLORIDE: 100 mmol/L (ref 96–112)
CO2: 28 mmol/L (ref 19–32)
Creatinine, Ser: 0.75 mg/dL (ref 0.50–1.10)
GFR calc Af Amer: >90 mL/min (ref >90)
GFR calc non Af Amer: 86 mL/min — ABNORMAL LOW (ref >90)
Glucose, Bld: 211 mg/dL — ABNORMAL HIGH (ref 70–99)
POTASSIUM: 3.7 mmol/L (ref 3.5–5.1)
Sodium: 137 mmol/L (ref 135–145)

## 2014-08-12 LAB — GLUCOSE, CAPILLARY
GLUCOSE-CAPILLARY: 200 mg/dL — AB (ref 70–99)
Glucose-Capillary: 170 mg/dL — ABNORMAL HIGH (ref 70–99)

## 2014-08-12 LAB — TROPONIN I
Troponin I: <0.03 ng/mL (ref <0.031)
Troponin I: <0.03 ng/mL (ref <0.031)

## 2014-08-12 NOTE — Discharge Summary (Signed)
Physician Discharge Summary  Jobe IgoCecilia Belzenie [place] QIO:[id number] DOB: 05/19/1946 DOA: 08/11/2014  PCP: [name], Edward, PA-C  Admit date: 08/11/2014 Discharge date: 08/12/2014  Time spent: 40 minutes  Recommendations for Outpatient Follow-up:  1. Follow-up with Dr. Newt LukesGandhi as outpatient  Discharge Diagnoses:  Principal Problem:   Chest pain Active Problems:   Essential hypertension, benign   Hyperlipidemia   Diabetes mellitus type 2, controlled   Pain in the chest   Discharge Condition: Stable  Diet recommendation: Heart healthy  Filed Weights   08/11/14 1240 08/11/14 1912 08/12/14 0511  Weight: 82.192 kg (181 lb 3.2 oz) 80.1 kg (176 lb 9.4 oz) 79.833 kg (176 lb)    History of present illness:  Cynthia Mata is a 69 y.o. female with history of diabetes mellitus type 2, hypertension, hyperlipidemia was referred to the ER after patient was complaining of chest pain. Patient has been having chest pain only in the nights when she sleeps or when she wakes up. Patient feels like a pressure squeezing type retrosternal nonradiating with no associated shortness of breath palpitations diaphoresis nausea vomiting. EKG was showing normal sinus rhythm with LBBB with no old EKG to compare. Patient's initial troponin was positive. On my exam patient is resting comfortably with no chest pain. Patient states that last year when she was in [place] and was told to have right ankle surgery she was told that she had some heart problem but her 2-D echo turned out to be normal.   Hospital Course:   Chest pain High-risk patient with diabetes will start 2, hypertension, hyperlipidemia and LBBB. Initial POC troponin was slightly positive at 0.04. Patient admitted overnight 3 sets of cardiac enzymes were negative for changes. This is discussed with her primary cardiologist Dr. Jacinto HalimGanji were recommended discharge and follow-up as outpatient. Patient has had stress test in December 2015 showed no  ischemic findings. Per Dr. Jacinto HalimGanji is LBBB is chronic. Currently patient is chest pain-free, negative BNP. No suspicion for PE as there is no hypoxia or tachycardia.  Insulin-dependent diabetes mellitus Type 2 diabetes mellitus requires insulin, continued home medications. Carbohydrate modified diet.  HTN Continue medications.  Dyslipidemia Patient is understanding, continued. Total cholesterol is 146 and LDL is 75.  Procedures:  None  Consultations:  None  Discharge Exam: Filed Vitals:   08/12/14 0743  BP: 123/67  Pulse: 80  Temp: 98.6 F (37 C)  Resp: 20   General: Alert and awake, oriented x3, not in any acute distress. HEENT: anicteric sclera, pupils reactive to light and accommodation, EOMI CVS: S1-S2 clear, no murmur rubs or gallops Chest: clear to auscultation bilaterally, no wheezing, rales or rhonchi Abdomen: soft nontender, nondistended, normal bowel sounds, no organomegaly Extremities: no cyanosis, clubbing or edema noted bilaterally Neuro: Cranial nerves II-XII intact, no focal neurological deficits  Discharge Instructions   Discharge Instructions    Diet - low sodium heart healthy    Complete by:  As directed      Diet - low sodium heart healthy    Complete by:  As directed      Increase activity slowly    Complete by:  As directed      Increase activity slowly    Complete by:  As directed           Current Discharge Medication List    CONTINUE these medications which have NOT CHANGED   Details  atorvastatin (LIPITOR) 20 MG tablet Take 1 tablet (20 mg total) by mouth daily. Qty:  30 tablet, Refills: 3    glipiZIDE-metformin (METAGLIP) 5-500 MG per tablet Take 2 tablets by mouth 2 (two) times daily before a meal. Qty: 120 tablet, Refills: 1   Associated Diagnoses: Type 2 diabetes mellitus with hyperglycemia    glucose blood test strip Use as instructed Qty: 100 each, Refills: 12    Insulin Detemir 100 UNIT/ML SOPN Inject 30 Units into the  skin every morning. Qty: 3 pen, Refills: 1    insulin regular (HUMULIN R) 100 units/mL injection Take 20-30 minutes before eating.  Take 10 units at breakfast, 12 units at lunch, and 14 units before dinner. Qty: 10 mL, Refills: 3    Lancets (ONETOUCH ULTRASOFT) lancets Use as instructed Qty: 100 each, Refills: 12    NIFEdipine (PROCARDIA-XL/ADALAT CC) 60 MG 24 hr tablet Take 60 mg by mouth daily.    !! olmesartan-hydrochlorothiazide (BENICAR HCT) 40-12.5 MG per tablet Take 1 tablet by mouth daily. Qty: 30 tablet, Refills: 1   Associated Diagnoses: Essential hypertension, benign    !! olmesartan-hydrochlorothiazide (BENICAR HCT) 40-12.5 MG per tablet Take 1 tablet by mouth daily. Qty: 30 tablet, Refills: 11    Insulin Pen Needle 31G X 8 MM MISC 1 Device by Does not apply route 2 (two) times daily. Qty: 50 each, Refills: 1     !! - Potential duplicate medications found. Please discuss with provider.     No Known Allergies Follow-up Information    Follow up with Pamella Pert, MD In 1 week.   Specialty:  Cardiology   Contact information:   [address] [place] [address] [phone]        The results of significant diagnostics from this hospitalization (including imaging, microbiology, ancillary and laboratory) are listed below for reference.    Significant Diagnostic Studies: Dg Chest Port 1 View  08/11/2014   CLINICAL DATA:  Chest pain, shortness of breath.  EXAM: PORTABLE CHEST - 1 VIEW  COMPARISON:  None.  FINDINGS: The heart size and mediastinal contours are within normal limits. Both lungs are clear. No pneumothorax or pleural effusion is noted. The visualized skeletal structures are unremarkable.  IMPRESSION: No acute cardiopulmonary abnormality seen.   Electronically Signed   By: Lupita Raider, M.D.   On: 08/11/2014 13:54    Microbiology: No results found for this or any previous visit (from the past 240 hour(s)).   Labs: Basic Metabolic  Panel:  Recent Labs Lab 08/11/14 1325 08/11/14 2052 08/12/14 0645  NA 137  --  137  K 3.5  --  3.7  CL 105  --  100  CO2 28  --  28  GLUCOSE 124*  --  211*  BUN 17  --  14  CREATININE 0.67 0.78 0.75  CALCIUM 8.9  --  8.9   Liver Function Tests:  Recent Labs Lab 08/11/14 1956  AST 23  ALT 20  ALKPHOS 76  BILITOT 0.5  PROT 7.0  ALBUMIN 3.7   No results for input(s): LIPASE, AMYLASE in the last 168 hours. No results for input(s): AMMONIA in the last 168 hours. CBC:  Recent Labs Lab 08/11/14 1325 08/11/14 2052 08/12/14 0645  WBC 7.6 7.0 5.5  NEUTROABS  --   --  3.2  HGB 11.7* 10.5* 10.2*  HCT 36.6 32.1* 31.4*  MCV 88.6 87.7 88.2  PLT 308 304 288   Cardiac Enzymes:  Recent Labs Lab 08/11/14 1325 08/11/14 1956 08/12/14 0118 08/12/14 0645  TROPONINI 0.04* <0.03 <0.03 <0.03  BNP: BNP (last 3 results)  Recent Labs  08/11/14 1325  BNP 19.6    ProBNP (last 3 results) No results for input(s): PROBNP in the last 8760 hours.  CBG:  Recent Labs Lab 08/11/14 2045 08/12/14 0741  GLUCAP 266* 200*       Signed:  Alden Bensinger A  [facility] 08/12/2014, 10:44 AM

## 2014-08-12 NOTE — Progress Notes (Signed)
Utilization review completed.  P.J. Devontre Siedschlag,RN,BSN Case Manager 

## 2014-08-14 ENCOUNTER — Telehealth: Payer: Self-pay | Admitting: Medical

## 2014-08-14 DIAGNOSIS — H539 Unspecified visual disturbance: Secondary | ICD-10-CM

## 2014-08-14 NOTE — Telephone Encounter (Signed)
Caller name: michael Relation to pt: son Call back number:  [phone] Pharmacy:  Reason for call:   Requesting referral to see an opthalmalogist. He states patient went to ED regarding eye bleeding.

## 2014-08-14 NOTE — Telephone Encounter (Signed)
Will try to refer pt to ophthalmologist for black and red occuring spots in her left eye visual field.

## 2014-08-15 ENCOUNTER — Encounter: Payer: 59 | Admitting: Nutrition

## 2014-08-15 LAB — HM DIABETES EYE EXAM

## 2014-09-01 ENCOUNTER — Ambulatory Visit: Payer: 59 | Admitting: Endocrinology

## 2014-09-04 ENCOUNTER — Ambulatory Visit: Payer: 59 | Admitting: Endocrinology

## 2014-09-06 ENCOUNTER — Encounter: Payer: Self-pay | Admitting: Endocrinology

## 2014-09-11 ENCOUNTER — Telehealth: Payer: Self-pay | Admitting: Medical

## 2014-09-11 ENCOUNTER — Other Ambulatory Visit: Payer: Self-pay | Admitting: *Deleted

## 2014-09-11 MED ORDER — INSULIN DETEMIR 100 UNIT/ML FLEXPEN: 30.0000 [IU] | PEN_INJECTOR | Freq: Every day | SUBCUTANEOUS | Status: DC

## 2014-09-11 NOTE — Telephone Encounter (Signed)
Caller name:Lee [place] Relation to pt:son Call back number:[phone] [facility] [place]  Reason for call: pt is needing rx Insulin Detemir (LEVEMIR) 100 UNIT/ML Pen.

## 2014-09-12 ENCOUNTER — Encounter: Payer: Self-pay | Admitting: Medical

## 2014-09-21 ENCOUNTER — Encounter: Payer: Self-pay | Admitting: Endocrinology

## 2014-09-21 ENCOUNTER — Ambulatory Visit (INDEPENDENT_AMBULATORY_CARE_PROVIDER_SITE_OTHER): Payer: 59 | Admitting: Endocrinology

## 2014-09-21 VITALS — BP 122/58 | HR 87 | Temp 99.3°F | Ht 61.0 in | Wt 180.5 lb

## 2014-09-21 DIAGNOSIS — I10 Essential (primary) hypertension: Secondary | ICD-10-CM

## 2014-09-21 DIAGNOSIS — E1165 Type 2 diabetes mellitus with hyperglycemia: Secondary | ICD-10-CM

## 2014-09-21 DIAGNOSIS — IMO0002 Reserved for concepts with insufficient information to code with codable children: Secondary | ICD-10-CM

## 2014-09-21 MED ORDER — INSULIN DETEMIR 100 UNIT/ML FLEXPEN: 30.0000 [IU] | PEN_INJECTOR | Freq: Every day | SUBCUTANEOUS | Status: DC

## 2014-09-21 NOTE — Patient Instructions (Addendum)
Change Levemir to morming 30 units and if am sugar goes over 140 go uo to 32 units  Take 8 units before each meal  If sugar after dinner stays over 200 then increase dinner dose to 10 or 12 units to keep sugar under 200 at least  Please check blood sugars at least half the time about 2 hours after any meal and 3 times per week on waking up.  Please bring blood sugar monitor to each visit. Recommended blood sugar levels about 2 hours after meal is 140-180 and on waking up 90-130  Walk daily  Levemir cambio de horario de maana de 30 unidades y si Insurance claims handlerel azcar de la maana va a ms de 140 uo ir a 32 unidades  Tome 8 unidades antes de cada comida  Si el azcar despus de la cena se mantiene arriba de 200 y Express Scriptsluego aumentar la dosis de la cena para 10 o 12 unidades para Haematologistmantener el azcar en virtud de al menos 200  Por favor, compruebe azcar en la sangre al [employer] Companiesmenos la mitad del tiempo cerca de 2 horas despus de cualquier comida y 3 veces por Building surveyorsemana en el despertar. Favor de traer monitor de azcar en la sangre a cada visita. Recomendado niveles de azcar en la sangre aproximadamente 2 horas despus de la comida es de 140-180 y al despertar 90-130

## 2014-09-21 NOTE — Progress Notes (Signed)
Patient ID: Cynthia Mata, female   DOB: February 16, 1946, 69 y.o.   MRN: [id number]           Reason for Appointment: Follow-up for Type 2 Diabetes  Referring physician: Saguier  History of Present Illness:          Diagnosis: Type 2 diabetes mellitus, date of diagnosis: 2001        Past history: Visit time of diagnosis she had symptoms of increased thirst and urination and very high blood sugars.  Her weight was at that time about 225 pounds. She was treated in her home country with oral medications possibly glipizide and metformin She also changed her diet and was able to lose weight  However she does not think her blood sugars had been consistently controlled In 2012 she was given Levemir insulin but not clear if this helped her sugar control as her A1c was still 9.5 in 2014 She was apparently continued on Metaglip also which he had been taking for a while; she has usually taken only 1 tablet twice a day of the 5/500 tablets.  Has not had any abdominal pain or diarrhea with metformin previously  Recent history:  She was seen in 07/2014 because of poor control and A1c of 9.4. She was switched from her premixed insulin to a new regimen of Levemir at night and mealtime regular insulin. She was also asked check her blood sugars at various times of the day more regularly She was also continued on her Metaglip but increase the dose to 2 tablets twice a day  When she started taking Regular Insulin she complained that will start shaking after taking the injection and she stopped taking this except in the morning Current blood sugar patterns and problems:  Her fasting blood sugars are mostly near target and averaging about 120  She thinks her blood sugars were getting low with taking regular insulin more than once a day but she has only one documented low blood sugars around midnight of 54 and another one after breakfast of 64.  Her blood sugars are appearing progressively higher in  the afternoon and evening with average blood sugar over 200 and afternoon and evenings  She appears to need significant amount of diabetes education  Still has difficulty losing weight, trying to walk up to 3 times a week now       Oral hypoglycemic drugs the patient is taking are: Metaglip bid     Side effects from medications have been: None INSULIN regimen is described as:  Levemir 30 hs; Regular 10 in am  Compliance with the medical regimen: Fair Hypoglycemia:    Glucose monitoring:  done         Glucometer: One Touch.      Blood Glucose readings by time of day and averages from   PRE-MEAL Breakfast Lunch Dinner Bedtime Overall  Glucose range: 72-193  135-254   158-321   86-341    Median:  117     242   186     Self-care: The diet that the patient has been following is: tries to limit Sweets, regular cokes, carbs and fats and is eating more vegetables .     Meals: 3 meals per day. Breakfast is eggs, beans and brain.  Has chicken and vegetables for lunch and dinner with some tortillas.  Has bread and nuts for snacks           Exercise:  walking 3 times a week  Dietician visit, most recent: Never .               Weight history:  Wt Readings from Last 3 Encounters:  09/21/14 180 lb 8 oz (81.874 kg)  08/12/14 176 lb (79.833 kg)  08/07/14 181 lb 3.2 oz (82.192 kg)    Glycemic control:   Lab Results  Component Value Date   HGBA1C 9.4* 06/29/2014   HGBA1C 9.5 05/31/2013   Lab Results  Component Value Date   LDLCALC 75 08/12/2014   CREATININE 0.75 08/12/2014         Medication List       This list is accurate as of: 09/21/14  4:25 PM.  Always use your most recent med list.               atorvastatin 20 MG tablet  Commonly known as:  LIPITOR  Take 1 tablet (20 mg total) by mouth daily.     glipiZIDE-metformin 5-500 MG per tablet  Commonly known as:  METAGLIP  Take 2 tablets by mouth 2 (two) times daily before a meal.     glucose blood test strip    Use as instructed     Insulin Detemir 100 UNIT/ML Pen  Commonly known as:  LEVEMIR  Inject 30 Units into the skin daily at 10 pm.     Insulin Pen Needle 31G X 8 MM Misc  1 Device by Does not apply route 2 (two) times daily.     insulin regular 100 units/mL injection  Commonly known as:  HUMULIN R  Take 20-30 minutes before eating.  Take 10 units at breakfast, 12 units at lunch, and 14 units before dinner.     NIFEdipine 60 MG 24 hr tablet  Commonly known as:  PROCARDIA-XL/ADALAT CC  Take 60 mg by mouth daily.     olmesartan-hydrochlorothiazide 40-12.5 MG per tablet  Commonly known as:  BENICAR HCT  Take 1 tablet by mouth daily.     onetouch ultrasoft lancets  Use as instructed        Allergies: No Known Allergies  Past Medical History  Diagnosis Date  . Hypertension   . Diabetes mellitus without complication   . Macular degeneration   . Anemia   . Cataract   . Sleep apnea   . Hyperlipidemia   . Osteoporosis   . Thyroid disease     Past Surgical History  Procedure Laterality Date  . Ankle fracture surgery      Right ankle  . Tonsillectomy      Abcess removed also    Family History  Problem Relation Age of Onset  . Hypertension Maternal Aunt   . Diabetes Neg Hx   . CAD Maternal Grandfather     Social History:  reports that she has never smoked. She does not have any smokeless tobacco history on file. She reports that she does not drink alcohol or use illicit drugs.    Review of Systems    Most recent eye exam was in 7/15 in [place]       Lipids: On Rx since 7/15 and no changes made in her dosage recently       Lab Results  Component Value Date   CHOL 146 08/12/2014   HDL 35* 08/12/2014   LDLCALC 75 08/12/2014   TRIG 179* 08/12/2014   CHOLHDL 4.2 08/12/2014                   Thyroid:  No  unusual  fatigue.  In 11/15 was told to take thyroid medication but this was stopped by her PCP in 1/16 as TSH was normal Currently not on any  supplements, no unusual fatigue  Lab Results  Component Value Date   TSH 4.753* 08/11/2014   TSH 3.74 06/29/2014       The blood pressure has been high for 15 years treated with multiple medications, currently Benicar HCT and Procardia  She is asking about right neck pain, advised her to follow-up with PCP  LABS:  No visits with results within 1 Week(s) from this visit. Latest known visit with results is:  Scanned Document on 09/12/2014  Component Date Value Ref Range Status  . HM Diabetic Eye Exam 08/15/2014 Retinopathy* No Retinopathy Final    Physical Examination:  BP 122/58 mmHg  Pulse 87  Temp(Src) 99.3 F (37.4 C) (Oral)  Ht 5\' 1"  (1.549 m)  Wt 180 lb 8 oz (81.874 kg)  BMI 34.12 kg/m2  SpO2 94%      ASSESSMENT/PLAN:  Diabetes type 2, uncontrolled with obesity and BMI 34 She has had poor control with A1c of 9.4% earlier in 2016. She appears to be insulin deficient and needs to be on basal bolus insulin regimen See history of present illness for current blood sugar patterns, problems identified and current management  Her blood sugars are excellent in the morning with taking Levemir at bedtime but she has progressively high blood sugars throughout the day after meals Although she was given relatively low doses of regular insulin she probably felt hypoglycemic with taking this at times and on her own has stopped taking this except in the mornings She did not call to report this Discussed with the patient again the need for controlling her hyperglycemia with mealtime insulin Also LEVEMIR probably is not working for 24 hours since blood sugars are higher later in the day  Recommendations made today:  Change Levemir to morning and may need to to 4 units more if fasting readings started going up.  Alternatively may need to take this twice a day  Start taking at least 8 units of regular insulin before each meal regardless of blood sugar  Discussed that she can  increase it to 10 or 12 units if blood sugars after her evening meal or above target of 180  Consultation with diabetes nurse educator for more detailed education and help with day-to-day management and insulin adjustment in another 10 days  Continue Metaglip for now but consider using Invokamet instead  Increase walking to daily  Mild hypothyroidism: Her TSH is minimally increased but she is not significantly symptomatic.  We will continue to watch  Translation of instructions given in Spanish and patient instructions reviewed in detail  Counseling time over 50% of today's 25 minute visit  Patient Instructions  Change Levemir to morming 30 units and if am sugar goes over 140 go uo to 32 units  Take 8 units before each meal  If sugar after dinner stays over 200 then increase dinner dose to 10 or 12 units to keep sugar under 200 at least  Please check blood sugars at least half the time about 2 hours after any meal and 3 times per week on waking up.  Please bring blood sugar monitor to each visit. Recommended blood sugar levels about 2 hours after meal is 140-180 and on waking up 90-130  Walk daily  Levemir cambio de horario de [place] de 30 unidades y si Insurance claims handlerel azcar de la Om[employer]maana  va a ms de 140 uo ir a 32 unidades  Tome 8 unidades antes de cada comida  Si el azcar despus de la cena se mantiene arriba de 200 y [employer] aumentar la dosis de la cena para 10 o 12 unidades para Haematologist en virtud de al menos 200  Por favor, compruebe azcar en la sangre al [employer] la mitad del tiempo cerca de 2 horas despus de cualquier comida y 3 veces por Building surveyor. Favor de traer monitor de azcar en la sangre a cada visita. Recomendado niveles de azcar en la sangre aproximadamente 2 horas despus de la comida es de 140-180 y al despertar 90-130       [facility] 09/21/2014, 4:25 PM   Note: This office note was prepared with Dragon voice recognition system technology. Any  transcriptional errors that result from this process are unintentional.

## 2014-09-25 ENCOUNTER — Other Ambulatory Visit: Payer: Self-pay | Admitting: *Deleted

## 2014-09-25 MED ORDER — "INSULIN SYRINGE-NEEDLE U-100 30G X 5/16"" 0.3 ML MISC": Status: DC

## 2014-09-25 MED ORDER — INSULIN DETEMIR 100 UNIT/ML ~~LOC~~ SOLN: SUBCUTANEOUS | Status: DC

## 2014-10-02 ENCOUNTER — Other Ambulatory Visit: Payer: Self-pay

## 2014-10-02 ENCOUNTER — Telehealth: Payer: Self-pay | Admitting: Medical

## 2014-10-02 MED ORDER — NIFEDIPINE ER 60 MG PO TB24: 60.0000 mg | ORAL_TABLET | Freq: Every day | ORAL | Status: DC

## 2014-10-02 NOTE — Telephone Encounter (Signed)
Relation to UX:LKGMpt:self  Call back number: [phone] [id number] [place] [place] - [address] [phone] (Phone) [phone] (Fax)         Reason for call:  Requesting a refill NIFEdipine (PROCARDIA-XL/ADALAT CC) 60 MG 24 hr tablet

## 2014-10-09 ENCOUNTER — Encounter: Payer: 59 | Attending: Endocrinology | Admitting: Nutrition

## 2014-10-09 DIAGNOSIS — Z713 Dietary counseling and surveillance: Secondary | ICD-10-CM | POA: Diagnosis not present

## 2014-10-09 DIAGNOSIS — Z794 Long term (current) use of insulin: Secondary | ICD-10-CM | POA: Diagnosis not present

## 2014-10-09 DIAGNOSIS — E119 Type 2 diabetes mellitus without complications: Secondary | ICD-10-CM | POA: Diagnosis present

## 2014-10-10 ENCOUNTER — Telehealth: Payer: Self-pay | Admitting: Nutrition

## 2014-10-10 NOTE — Telephone Encounter (Signed)
Patient says R insulin is making her nauseated, and has not taken it for 1 week.  She has vomited onces, after taking this.  Since stopping the R insulin, she has not been nauseated.  FBSs 185-250, and acS: 166-180.    Plan:  Per Dr. Remus BlakeKumar's voice order, Patient switched to Novolog insulin-5u ac meals, and Levemir decreased to 30u.

## 2014-10-10 NOTE — Patient Instructions (Signed)
Take Novolog insulin 5u before all meals Reduce Levemir to 30u

## 2014-10-10 NOTE — Progress Notes (Signed)
Patient is here with her daughter and an interpreter (Mr. Zipporah PlantsRecardo Perez).   Typical day: 7AM: up.  FBSs 186-250                Eats breakfast of egg, or beans, 1 plantain, coffee black. 10-11AM: snack of 1/2 avocado, or tangerine, with small handfull of nuts 1PM: lunch: 3 ounces chicken, or meat from supper night before, with rice, or noodles, and green salad with dressing 3-4 PM  Nuts, or 1 piece of bread with peanut butter 6PM: supper:  Similar to lunch in quantity and amounts of all foods  Denies snacking after supper.  Insulin:  Levemir: 35u qd,  No R insulin because she says it makes her sick (nauseate, and vomited X 1 after taking it.)  Plan:  Dr. Lucianne MussKumar notified and patient switched to Novolog insulin with copay card. Written instructions to reduce dose of Levemir to 30u and to give 5u Novolog ac all meals.

## 2014-10-10 NOTE — Telephone Encounter (Signed)
ok 

## 2014-10-11 ENCOUNTER — Other Ambulatory Visit: Payer: 59

## 2014-10-16 ENCOUNTER — Ambulatory Visit: Payer: 59 | Admitting: Endocrinology

## 2014-10-23 ENCOUNTER — Ambulatory Visit (HOSPITAL_BASED_OUTPATIENT_CLINIC_OR_DEPARTMENT_OTHER)
Admission: RE | Admit: 2014-10-23 | Discharge: 2014-10-23 | Disposition: A | Discharge status: Home / Self Care | Payer: 59 | Source: Ambulatory Visit | Attending: Medical | Admitting: Medical

## 2014-10-23 ENCOUNTER — Other Ambulatory Visit: Payer: 59

## 2014-10-23 ENCOUNTER — Encounter: Payer: Self-pay | Admitting: Medical

## 2014-10-23 ENCOUNTER — Ambulatory Visit (INDEPENDENT_AMBULATORY_CARE_PROVIDER_SITE_OTHER): Payer: 59 | Admitting: Medical

## 2014-10-23 VITALS — BP 163/83 | HR 84 | Temp 98.3°F | Ht 61.0 in | Wt 177.0 lb

## 2014-10-23 DIAGNOSIS — R591 Generalized enlarged lymph nodes: Secondary | ICD-10-CM

## 2014-10-23 DIAGNOSIS — M25571 Pain in right ankle and joints of right foot: Secondary | ICD-10-CM | POA: Insufficient documentation

## 2014-10-23 DIAGNOSIS — I10 Essential (primary) hypertension: Secondary | ICD-10-CM

## 2014-10-23 DIAGNOSIS — E785 Hyperlipidemia, unspecified: Secondary | ICD-10-CM | POA: Diagnosis not present

## 2014-10-23 DIAGNOSIS — S46819A Strain of other muscles, fascia and tendons at shoulder and upper arm level, unspecified arm, initial encounter: Secondary | ICD-10-CM

## 2014-10-23 DIAGNOSIS — Z124 Encounter for screening for malignant neoplasm of cervix: Secondary | ICD-10-CM

## 2014-10-23 DIAGNOSIS — M7989 Other specified soft tissue disorders: Secondary | ICD-10-CM | POA: Diagnosis not present

## 2014-10-23 DIAGNOSIS — S46811A Strain of other muscles, fascia and tendons at shoulder and upper arm level, right arm, initial encounter: Secondary | ICD-10-CM | POA: Diagnosis not present

## 2014-10-23 DIAGNOSIS — E119 Type 2 diabetes mellitus without complications: Secondary | ICD-10-CM

## 2014-10-23 DIAGNOSIS — M2011 Hallux valgus (acquired), right foot: Secondary | ICD-10-CM

## 2014-10-23 HISTORY — DX: Strain of other muscles, fascia and tendons at shoulder and upper arm level, unspecified arm, initial encounter: S46.819A

## 2014-10-23 LAB — CBC WITH DIFFERENTIAL/PLATELET
BASOS ABS: 0 10*3/uL (ref 0.0–0.1)
Basophils Relative: 0.5 % (ref 0.0–3.0)
Eosinophils Absolute: 0.1 10*3/uL (ref 0.0–0.7)
Eosinophils Relative: 1.9 % (ref 0.0–5.0)
HEMATOCRIT: 33.9 % — AB (ref 36.0–46.0)
HEMOGLOBIN: 11.3 g/dL — AB (ref 12.0–15.0)
LYMPHS ABS: 1.8 10*3/uL (ref 0.7–4.0)
Lymphocytes Relative: 29.5 % (ref 12.0–46.0)
MCHC: 33.3 g/dL (ref 30.0–36.0)
MCV: 86.7 fl (ref 78.0–100.0)
MONO ABS: 0.4 10*3/uL (ref 0.1–1.0)
Monocytes Relative: 6 % (ref 3.0–12.0)
Neutro Abs: 3.9 10*3/uL (ref 1.4–7.7)
Neutrophils Relative %: 62.1 % (ref 43.0–77.0)
PLATELETS: 303 10*3/uL (ref 150.0–400.0)
RBC: 3.9 Mil/uL (ref 3.87–5.11)
RDW: 13.7 % (ref 11.5–15.5)
WBC: 6.3 10*3/uL (ref 4.0–10.5)

## 2014-10-23 MED ORDER — CYCLOBENZAPRINE HCL 5 MG PO TABS: ORAL_TABLET | ORAL | Status: DC

## 2014-10-23 NOTE — Assessment & Plan Note (Signed)
Use low dose ibuprofen. If muscle region very tender can use one tablet of flexeril. Will see if pain resolved quickly.

## 2014-10-23 NOTE — Assessment & Plan Note (Signed)
Recommended other bp med/agent. Since diabetic and not controlled. Pt declined. She states will follow diet and exercise. Continue current meds. Asked her to check bp daily and document both systolic and diastolic. If bp done better controlled on follow up will rx new med.

## 2014-10-23 NOTE — Assessment & Plan Note (Signed)
Pt is seeing Dr. Lucianne MussKumar will get a1-c today.

## 2014-10-23 NOTE — Assessment & Plan Note (Signed)
Recheck lipid panel in 1 month fasting.

## 2014-10-23 NOTE — Progress Notes (Signed)
   Subjective:    Patient ID: Cynthia Mata, female    DOB: 06/09/1946, 69 y.o.   MRN: [id number]  HPI   Pt in with mild htn. When she checks at 173 and 165 systolic. Prior reading 130-140/??. She attributes mild high levels to spicy foods. No cardiac or neurologic signs or symptoms.   Pt last lipids have been 179. Hld mild low. Pt is not exercising. Sometimes rt ankle pain on and off. Hx of ankle fracture about one year ago.  Pt sees endocrine for her diabetes. Pt bs recently 130 in am. At night some high levels over 200. Pt getting a1-c today. No hyperglycemic signs or symptoms.  Pt has some rt side neck pain. Pain with movement on and off. Occasionally she feels small circular bump. Pain runs up to back of head. Massage of neck will help.  Rt great toe toenail fungus. Some pain in great toe at times walking. She failed treatment to nail fungus years ago.       Review of Systems  Constitutional: Negative for fever, chills, diaphoresis, activity change and fatigue.  Respiratory: Negative for cough, chest tightness and shortness of breath.   Cardiovascular: Negative for chest pain, palpitations and leg swelling.  Gastrointestinal: Negative for nausea, vomiting and abdominal pain.  Musculoskeletal: Negative for neck pain and neck stiffness.  Neurological: Negative for dizziness, tremors, seizures, syncope, facial asymmetry, speech difficulty, weakness, light-headedness, numbness and headaches.  Psychiatric/Behavioral: Negative for behavioral problems, confusion and agitation. The patient is not nervous/anxious.        Objective:   Physical Exam  General Mental Status- Alert. General Appearance- Not in acute distress.   Skin General: Color- Normal Color. Moisture- Normal Moisture.  Neck Carotid Arteries- Normal color. Moisture- Normal Moisture. No carotid bruits. No JVD. Rt side trapezius no tenderness to palpation presently. On palpation no mass or lymph nodes  palpated.  Chest and Lung Exam Auscultation: Breath Sounds:-Normal. CTA.  Cardiovascular Auscultation:Rythm- Regular, rate and rythm Murmurs & Other Heart Sounds:Auscultation of the heart reveals- No Murmurs.  Abdomen Inspection:-Inspeection Normal. Palpation/Percussion:Note:No mass. Palpation and Percussion of the abdomen reveal- Non Tender, Non Distended + BS, no rebound or guarding.      Neurologic Cranial Nerve exam:- CN III-XII intact(No nystagmus), symmetric smile. Strength:- 5/5 equal and symmetric strength both upper and lower extremities.  Rt ankle- moderate swelling and faint pain on palpation lateral malleoulus. Rt foot- rt great toe thick nail looks discolored. Some mild hallux valgus present.  Feet- no ulcers or lesions. See quality metric section.      Assessment & Plan:

## 2014-10-23 NOTE — Patient Instructions (Signed)
Essential hypertension, benign Recommended other bp med/agent. Since diabetic and not controlled. Pt declined. She states will follow diet and exercise. Continue current meds. Asked her to check bp daily and document both systolic and diastolic. If bp done better controlled on follow up will rx new med.   Hyperlipidemia Recheck lipid panel in 1 month fasting.   DM (diabetes mellitus) Pt is seeing Dr. Lucianne MussKumar will get a1-c today.   Trapezius strain Use low dose ibuprofen. If muscle region very tender can use one tablet of flexeril. Will see if pain resolved quickly.     Follow up in one month or as needed. Please come in fasting and make early morning appoitnment 30 minute slot.

## 2014-10-23 NOTE — Progress Notes (Signed)
Pre visit review using our clinic review tool, if applicable. No additional management support is needed unless otherwise documented below in the visit note. 

## 2014-10-24 ENCOUNTER — Telehealth: Payer: Self-pay | Admitting: Medical

## 2014-10-24 LAB — COMPREHENSIVE METABOLIC PANEL
ALT: 19 U/L (ref 0–35)
AST: 21 U/L (ref 0–37)
Albumin: 3.9 g/dL (ref 3.5–5.2)
Alkaline Phosphatase: 79 U/L (ref 39–117)
BUN: 18 mg/dL (ref 6–23)
CHLORIDE: 102 meq/L (ref 96–112)
CO2: 25 mEq/L (ref 19–32)
Calcium: 9.4 mg/dL (ref 8.4–10.5)
Creat: 0.75 mg/dL (ref 0.50–1.10)
Glucose, Bld: 209 mg/dL — ABNORMAL HIGH (ref 70–99)
POTASSIUM: 3.8 meq/L (ref 3.5–5.3)
Sodium: 139 mEq/L (ref 135–145)
Total Bilirubin: 0.3 mg/dL (ref 0.2–1.2)
Total Protein: 6.7 g/dL (ref 6.0–8.3)

## 2014-10-24 LAB — MICROALBUMIN / CREATININE URINE RATIO
Creatinine, Urine: 38.7 mg/dL
Microalb Creat Ratio: 80.1 mg/g — ABNORMAL HIGH (ref 0.0–30.0)
Microalb, Ur: 3.1 mg/dL — ABNORMAL HIGH (ref <2.0)

## 2014-10-24 LAB — HEMOGLOBIN A1C
HEMOGLOBIN A1C: 7.7 % — AB (ref <5.7)
Mean Plasma Glucose: 174 mg/dL — ABNORMAL HIGH (ref <117)

## 2014-10-24 NOTE — Telephone Encounter (Signed)
Caller name: Toledo,Lee Relation to pt: son  Call back number: [phone] [id number] - [place], [place] - [address] [phone] (Phone) [phone])        Reason for call:  Son requesting a refill glipiZIDE-metformin (METAGLIP) 5-500 MG per tablet

## 2014-10-25 ENCOUNTER — Other Ambulatory Visit: Payer: Self-pay | Admitting: Medical

## 2014-10-26 ENCOUNTER — Ambulatory Visit (INDEPENDENT_AMBULATORY_CARE_PROVIDER_SITE_OTHER): Payer: 59 | Admitting: Endocrinology

## 2014-10-26 ENCOUNTER — Encounter: Payer: Self-pay | Admitting: Endocrinology

## 2014-10-26 VITALS — BP 142/74 | HR 78 | Temp 97.8°F | Resp 16 | Ht 61.0 in | Wt 179.0 lb

## 2014-10-26 DIAGNOSIS — E1165 Type 2 diabetes mellitus with hyperglycemia: Secondary | ICD-10-CM

## 2014-10-26 DIAGNOSIS — E7800 Pure hypercholesterolemia, unspecified: Secondary | ICD-10-CM

## 2014-10-26 DIAGNOSIS — IMO0002 Reserved for concepts with insufficient information to code with codable children: Secondary | ICD-10-CM

## 2014-10-26 MED ORDER — CANAGLIFLOZIN-METFORMIN HCL 50-1000 MG PO TABS: ORAL_TABLET | ORAL | Status: DC

## 2014-10-26 NOTE — Patient Instructions (Addendum)
Do not refill Metaglip, start Invokamet twice daily; start with 1/2 tab for 3 days and then 1 tab with food  Take 10 Regular before meals except 12 before dinner  Check blood sugars on waking up ..  .. times a week Also check blood sugars about 2 hours after a meal and do this after different meals by rotation  Recommended blood sugar levels on waking up is 90-130 and about 2 hours after meal is 140-180 Please bring blood sugar monitor to each visit.  Reduce insulin by 2 if sugar <90  No rellene Metaglip, iniciar Invokamet dos veces al da; Games developercomenzar con 1/2 cp durante 3 das y Express Scriptsluego 1 comprimido con la comida  Tomar 10 regular antes de las comidas, excepto 12 antes de la cena  Compruebe azcar en la Lobbyistsangre durante el despertar .. .. veces a la semana Tambin puedes ver los niveles de azcar aproximadamente 2 horas despus de una comida y hacer esto despus de diferentes comidas por la rotacin  Recomendado niveles de azcar en la sangre durante el despertar es 90-130 y aproximadamente 2 horas despus de la comida es de 140-180 Favor de traer monitor de Bankerazcar en la sangre a cada visita.

## 2014-10-26 NOTE — Addendum Note (Signed)
Addended by: Reather LittlerKUMAR, Toluwani Yadav on: 10/26/2014 01:02 PM   Modules accepted: Orders, Medications, Level of Service

## 2014-10-26 NOTE — Progress Notes (Addendum)
Patient ID: Cynthia Mata, female   DOB: 06/10/1945, 69 y.o.   MRN: [id number]           Reason for Appointment: Follow-up for Type 2 Diabetes  Referring physician: Saguier  History of Present Illness:          Diagnosis: Type 2 diabetes mellitus, date of diagnosis: 2001        Past history: Visit time of diagnosis she had symptoms of increased thirst and urination and very high blood sugars.  Her weight was at that time about 225 pounds. She was [place] with oral medications possibly glipizide and metformin She also changed her diet and was able to lose weight  However she does not think her blood sugars had been consistently controlled In 2012 she was given Levemir insulin but not clear if this helped her sugar control as her A1c was still 9.5 in 2014 She was apparently continued on Metaglip also which he had been taking for a while; she has usually taken only 1 tablet twice a day of the 5/500 tablets.  Has not had any abdominal pain or diarrhea with metformin previously  Recent history:  She was seen in 07/2014 because of poor control and A1c of 9.4. She was switched from her premixed insulin to a new regimen of Levemir at night and mealtime regular insulin. She was also asked check her blood sugars at various times of the day more regularly She was also continued on her Metaglip but increased the dose to 2 tablets twice a day which she has been tolerating However has not taken any for the last 3 or 4 days as she ran out On her last visit she was told to take Levemir in the morning instead of at night because of relatively higher readings in the afternoons and evenings compared to the morning Current blood sugar patterns and problems:  Her fasting blood sugars are quite variable recently and relatively higher overall despite increasing her dose to 35 units of Levemir  Blood sugars are somewhat better around lunchtime but still periodically high  She  has several high readings around supper time but again these are inconsistent.  She does not know what foods make her blood sugar go up and she has not seen a dietitian  She has only a single reading later in the evening after supper and this is very high   She does not adjust her insulin based on what she is eating.  However she is trying to take it 30 minute before eating as discussed  Still has difficulty losing weight, trying to walk up to 3 times a week or more       Oral hypoglycemic drugs the patient is taking are: Metaglip bid     Side effects from medications have been: None INSULIN regimen is described as:  Levemir 35 in am Regular 10 units 30 min ac tid  Compliance with the medical regimen: Fair Hypoglycemia:    Glucose monitoring:  done 2-3 times a day         Glucometer: One Touch.      Blood Glucose readings by time of day and averages   PRE-MEAL Breakfast Lunch Dinner Bedtime Overall  Glucose range: 102-238  122-320   130-322   394    Mean/median:  183   173   180   174    Self-care: The diet that the patient has been following is: tries to limit Sweets,  regular cokes, carbs and fats and is eating more vegetables .     Meals: 3 meals per day. Breakfast is eggs, beans and brain.  Has chicken and vegetables for lunch and dinner with some tortillas.  Has bread and nuts for snacks           Exercise:  walking 3 times a week  10-15 min        Dietician visit, most recent: Never .               Weight history:  Wt Readings from Last 3 Encounters:  10/26/14 179 lb (81.194 kg)  10/23/14 177 lb (80.287 kg)  09/21/14 180 lb 8 oz (81.874 kg)    Glycemic control:   Lab Results  Component Value Date   HGBA1C 7.7* 10/23/2014   HGBA1C 9.4* 06/29/2014   HGBA1C 9.5 05/31/2013   Lab Results  Component Value Date   MICROALBUR 3.1* 10/23/2014   LDLCALC 75 08/12/2014   CREATININE 0.75 10/23/2014         Medication List       This list is accurate as of: 10/26/14   1:00 PM.  Always use your most recent med list.               atorvastatin 20 MG tablet  Commonly known as:  LIPITOR  Take 1 tablet (20 mg total) by mouth daily.     Canagliflozin-Metformin HCl 50-1000 MG Tabs  Commonly known as:  INVOKAMET  1 tab bid pc     cyclobenzaprine 5 MG tablet  Commonly known as:  FLEXERIL  1 tab po q hs prn neck pain     glipiZIDE-metformin 5-500 MG per tablet  Commonly known as:  METAGLIP  Take 2 tablets by mouth 2 (two) times daily before a meal.     glucose blood test strip  Use as instructed     insulin detemir 100 UNIT/ML injection  Commonly known as:  LEVEMIR  Inject 30 units at bedtime     Insulin Pen Needle 31G X 8 MM Misc  1 Device by Does not apply route 2 (two) times daily.     insulin regular 100 units/mL injection  Commonly known as:  HUMULIN R  Take 20-30 minutes before eating.  Take 10 units at breakfast, 12 units at lunch, and 14 units before dinner.     Insulin Syringe-Needle U-100 30G X 5/16" 0.3 ML Misc  Use to inject levemir daily at bedtime     NIFEdipine 60 MG 24 hr tablet  Commonly known as:  PROCARDIA-XL/ADALAT CC  Take 1 tablet (60 mg total) by mouth daily.     olmesartan-hydrochlorothiazide 40-12.5 MG per tablet  Commonly known as:  BENICAR HCT  Take 1 tablet by mouth daily.     onetouch ultrasoft lancets  Use as instructed        Allergies: No Known Allergies  Past Medical History  Diagnosis Date  . Hypertension   . Diabetes mellitus without complication   . Macular degeneration   . Anemia   . Cataract   . Sleep apnea   . Hyperlipidemia   . Osteoporosis   . Thyroid disease   . Trapezius strain 10/23/2014    Past Surgical History  Procedure Laterality Date  . Ankle fracture surgery      Right ankle  . Tonsillectomy      Abcess removed also    Family History  Problem Relation Age of Onset  . Hypertension  Maternal Aunt   . Diabetes Neg Hx   . CAD Maternal Grandfather     Social  History:  reports that she has never smoked. She does not have any smokeless tobacco history on file. She reports that she does not drink alcohol or use illicit drugs.    Review of Systems    Most recent eye exam was in 7/15 in [place]       Lipids: On Rx since 7/15 and LDL controlled with Lipitor       Lab Results  Component Value Date   CHOL 146 08/12/2014   HDL 35* 08/12/2014   LDLCALC 75 08/12/2014   TRIG 179* 08/12/2014   CHOLHDL 4.2 08/12/2014                  Thyroid:  In 11/15 was told to take thyroid medication but this was stopped by her PCP in 1/16 as TSH was normal Currently not on any supplements and TSH is mildly increased, she is asymptomatic  Lab Results  Component Value Date   TSH 4.753* 08/11/2014   TSH 3.74 06/29/2014       The blood pressure has been high for 15 years treated with multiple medications, currently Benicar HCT and Procardia   LABS:  Office Visit on 10/23/2014  Component Date Value Ref Range Status  . WBC 10/23/2014 6.3  4.0 - 10.5 K/uL Final  . RBC 10/23/2014 3.90  3.87 - 5.11 Mil/uL Final  . Hemoglobin 10/23/2014 11.3* 12.0 - 15.0 g/dL Final  . HCT 16/03/9603 33.9* 36.0 - 46.0 % Final  . MCV 10/23/2014 86.7  78.0 - 100.0 fl Final  . MCHC 10/23/2014 33.3  30.0 - 36.0 g/dL Final  . RDW 54/02/8118 13.7  11.5 - 15.5 % Final  . Platelets 10/23/2014 303.0  150.0 - 400.0 K/uL Final  . Neutrophils Relative % 10/23/2014 62.1  43.0 - 77.0 % Final  . Lymphocytes Relative 10/23/2014 29.5  12.0 - 46.0 % Final  . Monocytes Relative 10/23/2014 6.0  3.0 - 12.0 % Final  . Eosinophils Relative 10/23/2014 1.9  0.0 - 5.0 % Final  . Basophils Relative 10/23/2014 0.5  0.0 - 3.0 % Final  . Neutro Abs 10/23/2014 3.9  1.4 - 7.7 K/uL Final  . Lymphs Abs 10/23/2014 1.8  0.7 - 4.0 K/uL Final  . Monocytes Absolute 10/23/2014 0.4  0.1 - 1.0 K/uL Final  . Eosinophils Absolute 10/23/2014 0.1  0.0 - 0.7 K/uL Final  . Basophils Absolute 10/23/2014 0.0  0.0 - 0.1  K/uL Final  . Hgb A1c MFr Bld 10/23/2014 7.7* <5.7 % Final   Comment:                                                                        According to the ADA Clinical Practice Recommendations for 2011, when HbA1c is used as a screening test:     >=6.5%   Diagnostic of Diabetes Mellitus            (if abnormal result is confirmed)   5.7-6.4%   Increased risk of developing Diabetes Mellitus   References:Diagnosis and Classification of Diabetes Mellitus,Diabetes Care,2011,34(Suppl 1):S62-S69 and Standards of Medical Care in  Diabetes - 2011,Diabetes Care,2011,34 (Suppl 1):S11-S61.     . Mean Plasma Glucose 10/23/2014 174* <117 mg/dL Final  . Sodium 78/29/5621 139  135 - 145 mEq/L Final  . Potassium 10/23/2014 3.8  3.5 - 5.3 mEq/L Final  . Chloride 10/23/2014 102  96 - 112 mEq/L Final  . CO2 10/23/2014 25  19 - 32 mEq/L Final  . Glucose, Bld 10/23/2014 209* 70 - 99 mg/dL Final  . BUN 30/86/5784 18  6 - 23 mg/dL Final  . Creat 69/62/9528 0.75  0.50 - 1.10 mg/dL Final  . Total Bilirubin 10/23/2014 0.3  0.2 - 1.2 mg/dL Final  . Alkaline Phosphatase 10/23/2014 79  39 - 117 U/L Final  . AST 10/23/2014 21  0 - 37 U/L Final  . ALT 10/23/2014 19  0 - 35 U/L Final  . Total Protein 10/23/2014 6.7  6.0 - 8.3 g/dL Final  . Albumin 41/32/4401 3.9  3.5 - 5.2 g/dL Final  . Calcium 02/72/5366 9.4  8.4 - 10.5 mg/dL Final  . Microalb, Ur 44/08/4740 3.1* <2.0 mg/dL Final   Comment: The ADA (Diabetes Care 2014;37(suppl 1):S14-S80) has defined abnormalities in albumin excretion as follows:            Category           Result                            (mg/g creatinine)                 Normal:    <30       Microalbuminuria:    30 - 299   Clinical albuminuria:    > or = 300    The ADA recommends that at least two of three specimens collected within a 3 - 6 month period be abnormal before considering a patient to be within a diagnostic category.   . Creatinine, Urine 10/23/2014 38.7    Final   No reference range established.  . Microalb Creat Ratio 10/23/2014 80.1* 0.0 - 30.0 mg/g Final    Physical Examination:  BP 142/74 mmHg  Pulse 78  Temp(Src) 97.8 F (36.6 C)  Resp 16  Ht  (1.549 m)  Wt 179 lb (81.194 kg)  BMI 33.84 kg/m2  SpO2 95%  No pedal edema present    ASSESSMENT/PLAN:  Diabetes type 2, uncontrolled with obesity and BMI 34 She has had poor control for some time but A1c is relatively better at 7.7 now See history of present illness for current blood sugar patterns, problems identified and current management  However home blood sugars are overall still significantly high and averaging 190; this is mostly with blood sugar monitoring before meals and not after Has also been sporadic readings over 300 probably based on her diet She has finally started taking mealtime insulin more consistently at mealtimes and is not complaining of any hypoglycemic symptoms with starting this regimen now However has difficulty losing weight and having consistent control She does need continued diabetes education and needs to have a formal meal plan made and issues with diet resolved she does not know what foods are making her blood sugar go up Fasting glucose: This is variably high and may be dependent on the blood sugar after supper the night before and so occasionally high sugars as low as 102  She is a good candidate for drugs like Invokana because of multiple benefits in combination with insulin  Discussed action of SGLT 2 drugs on lowering glucose by decreasing kidney absorption of glucose, benefits of weight loss and lower blood pressure, possible side effects including candidiasis and dosage regimen    Recommendations made today as in patient instructions. She was told to start Invokana at a low dose of half tablet twice a day and she will call if she has any difficulties with GI intolerance or vaginal candidiasis with this Discussed that she may need to reduce  her insulin once this is more effective but in the meantime will continue the same regimen except 12 units before supper Discussed A1c and blood sugar targets at various times Consultation with dietitian to be scheduled  Translation of instructions given in Spanish and patient instructions reviewed in detail    Patient Instructions  Do not refill Metaglip, start Invokamet twice daily; start with 1/2 tab for 3 days and then 1 tab with food  Take 10 Regular before meals except 12 before dinner  Check blood sugars on waking up ..  .. times a week Also check blood sugars about 2 hours after a meal and do this after different meals by rotation  Recommended blood sugar levels on waking up is 90-130 and about 2 hours after meal is 140-180 Please bring blood sugar monitor to each visit.  Reduce insulin by 2 if sugar <90  No rellene Metaglip, iniciar Invokamet dos veces al da; Games developercomenzar con 1/2 cp durante 3 das y Express Scriptsluego 1 comprimido con la comida  Tomar 10 regular antes de las comidas, excepto 12 antes de la cena  Compruebe azcar en la Lobbyistsangre durante el despertar .. .. veces a la semana Tambin puedes ver los niveles de azcar aproximadamente 2 horas despus de una comida y hacer esto despus de diferentes comidas por la rotacin  Recomendado niveles de azcar en la sangre durante el despertar es 90-130 y aproximadamente 2 horas despus de la comida es de 140-180 Favor de traer monitor de Bankerazcar en la sangre a cada visita.       Counseling time on subjects discussed above is over 50% of today's 25 minute visit   Mirely Pangle 10/26/2014, 1:00 PM   Note: This office note was prepared with Insurance underwriterDragon voice recognition system technology. Any transcriptional errors that result from this process are unintentional.

## 2014-11-01 ENCOUNTER — Ambulatory Visit: Payer: Self-pay | Admitting: Podiatry

## 2014-11-02 ENCOUNTER — Telehealth: Payer: Self-pay | Admitting: *Deleted

## 2014-11-02 NOTE — Telephone Encounter (Signed)
Noted, patients daughter is aware. 

## 2014-11-02 NOTE — Telephone Encounter (Signed)
She can stop this until Monday and then try taking only 1 tablet after evening meal of the Invokamet.  Would like her to see her PCP if not better by Monday

## 2014-11-02 NOTE — Telephone Encounter (Signed)
Patients daughter called, she said 3 days after starting the Invokana she started vomiting, is extremely tired and very weak, daughter wants to know what to do?  She is not running a fever or having any other symptoms, Please advise

## 2014-11-10 ENCOUNTER — Encounter: Payer: 59 | Attending: Endocrinology | Admitting: Dietician

## 2014-11-10 ENCOUNTER — Encounter: Payer: Self-pay | Admitting: Dietician

## 2014-11-10 VITALS — Ht 60.0 in | Wt 178.0 lb

## 2014-11-10 DIAGNOSIS — Z794 Long term (current) use of insulin: Secondary | ICD-10-CM | POA: Insufficient documentation

## 2014-11-10 DIAGNOSIS — Z713 Dietary counseling and surveillance: Secondary | ICD-10-CM | POA: Insufficient documentation

## 2014-11-10 DIAGNOSIS — E119 Type 2 diabetes mellitus without complications: Secondary | ICD-10-CM | POA: Diagnosis not present

## 2014-11-10 NOTE — Patient Instructions (Addendum)
Plan:  Make half your plate non starchy vegetables Aim for 3 Carb Choices per meal (45 grams) +/- 1 either way  Aim for 0-1 Carbs per snack if hungry  Include protein in moderation with your meals and snacks Consider reading food labels for Total Carbohydrate and Fat Grams of foods Consider  increasing your activity level by walking or doing arm chair exercises for 30 minutes daily as tolerated

## 2014-11-10 NOTE — Progress Notes (Signed)
  Medical Nutrition Therapy:  Appt start time: 0915 end time:  1045.   Assessment:  Primary concerns today: Patient is here today with her son and interpretor Wyvonnia DuskyGraciela Namihira. She would like to learn more about nutrition to improve her blood sugar.  Patient with hx of type 2 DM since 2001. Hx also includes hypercholesterolemia.  HgbA1c 7.7%  10/23/14 which has decreased from 9.4% 06/29/2014.  She checks her blood sugar before each meal and HS and other times as needed.  CBG 163 this am and is usually 200 or greater before lunch and dinner.  She states that she lost weight in the past but then regained it.    Patient lives with son and daughter.  Her son and daughter shop and daughter cooks.  Patient does not work.  She understands some [place] but prefers communication in [place].  Son has finished nursing school and is currently looking for a job.  [place].    Preferred Learning Style:   No preference indicated   Learning Readiness:   Ready  Change in progress   MEDICATIONS: Levemier 35 units each am, Humulin 10 units before each meal if blood sugar is >200, and Invokamet.  Patient states that she was vomiting on higher dose of Invokamet but tolerating better with half dose except for some bloating.  Complains of constipation.   DIETARY INTAKE: Patient states that she had increased anxiety to eat due to increased appetite prior to beginning Invokamet.  She no longer needs snacks.  24-hr recall:  B (7 AM): juice (celery, cucumber, spinach, 1/2 apple, 1/2pear) egg, 1 WW pancake with 1 oz cream cheese, black coffee Snk ( AM): none now  L (12-1 PM): vegetable soup, baked chicken, salad, brown rice Snk ( PM): none D (6-7 PM): 1 slice pizza OR same as lunch (meat, rice, vegetables) always salad Snk ( PM): none Beverages: black coffee, water, juice above (includes fiber), unsweetened black tea.  Usual physical activity: Walks 10-15 minutes once a day.    Estimated energy  needs: 1200 calories 135 g carbohydrates 75 g protein 40 g fat  Progress Towards Goal(s):  In progress.   Nutritional Diagnosis:  NB-1.1 Food and nutrition-related knowledge deficit As related to guidleines of diabetic diet.  As evidenced by patient report.    Intervention:  Nutrition counseling and diabetes education initiated. Discussed Carb Counting by food group as method of portion control, reading food labels, and benefits of increased activity. Also discussed basic physiology of Diabetes, and A1c. Also discussed fiber, its benefits and role in treating constipation.  Plan:  Make half your plate non starchy vegetables Aim for 3 Carb Choices per meal (45 grams) +/- 1 either way  Aim for 0-1 Carbs per snack if hungry  Include protein in moderation with your meals and snacks Consider reading food labels for Total Carbohydrate and Fat Grams of foods Consider  increasing your activity level by walking or doing arm chair exercises for 30 minutes daily as tolerated  Teaching Method Utilized:  Visual Auditory Hands on  Handouts given during visit include:  In Spanish  My plate  Living well with diabetes  Meal plan card  Barriers to learning/adherence to lifestyle change: none  Demonstrated degree of understanding via:  Teach Back   Monitoring/Evaluation:  Dietary intake, exercise, and body weight prn.

## 2014-11-14 ENCOUNTER — Ambulatory Visit: Payer: Self-pay | Admitting: Gynecology

## 2014-11-23 ENCOUNTER — Ambulatory Visit: Payer: 59 | Admitting: Endocrinology

## 2014-11-28 ENCOUNTER — Encounter: Payer: Self-pay | Admitting: Medical

## 2014-11-28 ENCOUNTER — Ambulatory Visit (HOSPITAL_BASED_OUTPATIENT_CLINIC_OR_DEPARTMENT_OTHER)
Admission: RE | Admit: 2014-11-28 | Discharge: 2014-11-28 | Disposition: A | Discharge status: Home / Self Care | Payer: 59 | Source: Ambulatory Visit | Attending: Medical | Admitting: Medical

## 2014-11-28 ENCOUNTER — Telehealth: Payer: Self-pay | Admitting: Medical

## 2014-11-28 ENCOUNTER — Ambulatory Visit (INDEPENDENT_AMBULATORY_CARE_PROVIDER_SITE_OTHER): Payer: 59 | Admitting: Medical

## 2014-11-28 VITALS — BP 170/70 | HR 74 | Temp 98.9°F | Ht 61.0 in | Wt 179.2 lb

## 2014-11-28 DIAGNOSIS — M25511 Pain in right shoulder: Secondary | ICD-10-CM | POA: Diagnosis not present

## 2014-11-28 DIAGNOSIS — E119 Type 2 diabetes mellitus without complications: Secondary | ICD-10-CM

## 2014-11-28 DIAGNOSIS — I10 Essential (primary) hypertension: Secondary | ICD-10-CM

## 2014-11-28 DIAGNOSIS — M25519 Pain in unspecified shoulder: Secondary | ICD-10-CM | POA: Insufficient documentation

## 2014-11-28 MED ORDER — OLMESARTAN MEDOXOMIL-HCTZ 40-12.5 MG PO TABS: 1.0000 | ORAL_TABLET | Freq: Every day | ORAL | Status: DC

## 2014-11-28 MED ORDER — NIFEDIPINE ER 60 MG PO TB24: 60.0000 mg | ORAL_TABLET | Freq: Every day | ORAL | Status: DC

## 2014-11-28 MED ORDER — CLONIDINE HCL 0.1 MG PO TABS: 0.1000 mg | ORAL_TABLET | Freq: Two times a day (BID) | ORAL | Status: DC

## 2014-11-28 NOTE — Telephone Encounter (Signed)
Start taking Invokana half tablet after eating and after one week try the whole tablet with food

## 2014-11-28 NOTE — Telephone Encounter (Signed)
I spoke with patients daughter today who said her mom has been taking half a tablet with food like you had told her to, but she is still vomiting. They are adamant that they do not want her to continue taking it.  Please advise.

## 2014-11-28 NOTE — Assessment & Plan Note (Signed)
Continue nifedipine and benicar/hct. Decrease caffeine significantly. I am adding clonidine rx. Rx advisment. You want to cut back on caffeine first and see effect on bp.   Clondine sent to your pharmacy. I want you bp to be less than 140/90. Ideally closer to 130/80.

## 2014-11-28 NOTE — Assessment & Plan Note (Signed)
Will contact Dr. Lucianne Muss office and relay pt message on wanting to be on metformin again.

## 2014-11-28 NOTE — Telephone Encounter (Signed)
Note to Dr. Lucianne Muss

## 2014-11-28 NOTE — Telephone Encounter (Signed)
Change to Jardiance 10 mg daily, okay to prior authorization if needed

## 2014-11-28 NOTE — Assessment & Plan Note (Signed)
Xray of rt shoulder. Ibuprofen low dose otc.

## 2014-11-28 NOTE — Progress Notes (Signed)
Pre visit review using our clinic review tool, if applicable. No additional management support is needed unless otherwise documented below in the visit note. 

## 2014-11-28 NOTE — Progress Notes (Signed)
Subjective:    Patient ID: Cynthia Mata, female    DOB: 02-24-1946, 69 y.o.   MRN: [id number]  HPI  Pt in still has rt shoulder pain. When she fell past June. Daily pain to some degree. Describes mild to moderate level pain. Sometimes can't elevate arm at times.   Pt has some rt ankle pain mild and chronic. Hx of surgery and hardware. She had mild swelling intermittent. I saw her last time for this and her ankle feels some better recently. Hardware intact and some degenerative changes.  Pt see endocrinologist. This made her vomit. She has notified Dr. Lucianne Muss. He advised taking 1/2 in am and pm. Pt wants me to pass message to Dr. Lucianne Muss office.   Htn- has been on both meds and her bp is still high 143/153 systolic. One time 180. Diastolic is always in 70. Pt drinks about 4 cups a day or more throughout the day. No cardiac or neurologic signs or symptoms.    Review of Systems  Constitutional: Negative for fever, chills, diaphoresis, activity change and fatigue.  Respiratory: Negative for cough, chest tightness and shortness of breath.   Cardiovascular: Negative for chest pain, palpitations and leg swelling.  Gastrointestinal: Negative for nausea, vomiting and abdominal pain.  Endocrine: Negative for polydipsia, polyphagia and polyuria.  Musculoskeletal: Negative for neck pain and neck stiffness.       Rt shoulder paoin  Neurological: Negative for dizziness, tremors, seizures, syncope, facial asymmetry, speech difficulty, weakness, light-headedness, numbness and headaches.  Psychiatric/Behavioral: Negative for behavioral problems, confusion and agitation. The patient is not nervous/anxious.     Past Medical History  Diagnosis Date  . Hypertension   . Diabetes mellitus without complication   . Macular degeneration   . Anemia   . Cataract   . Sleep apnea   . Hyperlipidemia   . Osteoporosis   . Thyroid disease   . Trapezius strain 10/23/2014    History   Social  History  . Marital Status: Married    Spouse Name: N/A  . Number of Children: N/A  . Years of Education: N/A   Occupational History  . Not on file.   Social History Main Topics  . Smoking status: Never Smoker   . Smokeless tobacco: Not on file  . Alcohol Use: No  . Drug Use: No  . Sexual Activity: Not on file   Other Topics Concern  . Not on file   Social History Narrative    Past Surgical History  Procedure Laterality Date  . Ankle fracture surgery      Right ankle  . Tonsillectomy      Abcess removed also    Family History  Problem Relation Age of Onset  . Hypertension Maternal Aunt   . Diabetes Neg Hx   . CAD Maternal Grandfather     No Known Allergies  Current Outpatient Prescriptions on File Prior to Visit  Medication Sig Dispense Refill  . atorvastatin (LIPITOR) 20 MG tablet Take 1 tablet (20 mg total) by mouth daily. (Patient taking differently: Take 20 mg by mouth daily at 6 PM. ) 30 tablet 3  . Canagliflozin-Metformin HCl (INVOKAMET) 50-1000 MG TABS 1 tab bid pc 60 tablet 3  . cyclobenzaprine (FLEXERIL) 5 MG tablet 1 tab po q hs prn neck pain 5 tablet 0  . glucose blood test strip Use as instructed 100 each 12  . insulin detemir (LEVEMIR) 100 UNIT/ML injection Inject 30 units at bedtime 10 mL 3  .  Insulin Pen Needle 31G X 8 MM MISC 1 Device by Does not apply route 2 (two) times daily. 50 each 1  . insulin regular (HUMULIN R) 100 units/mL injection Take 20-30 minutes before eating.  Take 10 units at breakfast, 12 units at lunch, and 14 units before dinner. 10 mL 3  . Insulin Syringe-Needle U-100 30G X 5/16" 0.3 ML MISC Use to inject levemir daily at bedtime 30 each 3  . Lancets (ONETOUCH ULTRASOFT) lancets Use as instructed 100 each 12   No current facility-administered medications on file prior to visit.    BP 170/70 mmHg  Pulse 74  Temp(Src) 98.9 F (37.2 C) (Oral)  Ht 5\' 1"  (1.549 m)  Wt 179 lb 3.2 oz (81.285 kg)  BMI 33.88 kg/m2  SpO2  95%       Objective:   Physical Exam  General Mental Status- Alert. General Appearance- Not in acute distress.   Skin General: Color- Normal Color. Moisture- Normal Moisture.  Neck Carotid Arteries- Normal color. Moisture- Normal Moisture. No carotid bruits. No JVD.  Chest and Lung Exam Auscultation: Breath Sounds:-Normal CTA  Cardiovascular Auscultation:Rythm- RRR Murmurs & Other Heart Sounds:Auscultation of the heart reveals- No Murmurs.  Abdomen Inspection:-Inspeection Normal. Palpation/Percussion:Note:No mass. Palpation and Percussion of the abdomen reveal- Non Tender, Non Distended + BS, no rebound or guarding.    Neurologic Cranial Nerve exam:- CN III-XII intact(No nystagmus), symmetric smile. Strength:- 5/5 equal and symmetric strength both upper and lower extremities.  Rt shoulder- Full rom with minimal pain presently. No crepitus. Mild pain on palpation posterior aspect.      Assessment & Plan:

## 2014-11-28 NOTE — Patient Instructions (Signed)
Pain in joint, shoulder region Xray of rt shoulder. Ibuprofen low dose otc.  Essential hypertension, benign Continue nifedipine and benicar/hct. Decrease caffeine significantly. I am adding clonidine rx. Rx advisment. You want to cut back on caffeine first and see effect on bp.   Clondine sent to your pharmacy. I want you bp to be less than 140/90. Ideally closer to 130/80.  DM (diabetes mellitus) Will contact Dr. Lucianne Muss office and relay pt message on wanting to be on metformin again.    Follow up in 3 wks or as needed

## 2014-11-29 ENCOUNTER — Other Ambulatory Visit: Payer: Self-pay | Admitting: *Deleted

## 2014-11-29 MED ORDER — EMPAGLIFLOZIN 10 MG PO TABS: ORAL_TABLET | ORAL | Status: DC

## 2014-11-29 NOTE — Telephone Encounter (Signed)
Noted, daughter is aware, rx sent

## 2014-12-07 ENCOUNTER — Ambulatory Visit: Payer: Self-pay | Admitting: Gynecology

## 2014-12-08 ENCOUNTER — Telehealth: Payer: Self-pay | Admitting: Endocrinology

## 2014-12-08 NOTE — Telephone Encounter (Signed)
Make sure she is taking 10 units of regular insulin before meals.  Need to increase the regular insulin to 14 units before meals and increase the Levemir up to 40 units.  Januvia may or may not help her blood sugars

## 2014-12-08 NOTE — Telephone Encounter (Signed)
Pt's Cynthia Mata was just added to her regimen yesterday.   Yesterday before bed 258 This AM 198, just before noon her sugar was 400  She has taken the [place] already this AM. Please advise

## 2014-12-08 NOTE — Telephone Encounter (Signed)
Left this pt a VM to return call

## 2014-12-14 ENCOUNTER — Ambulatory Visit: Payer: 59 | Admitting: Endocrinology

## 2015-02-28 ENCOUNTER — Encounter: Payer: Self-pay | Admitting: Medical

## 2015-02-28 ENCOUNTER — Ambulatory Visit (INDEPENDENT_AMBULATORY_CARE_PROVIDER_SITE_OTHER): Payer: 59 | Admitting: Medical

## 2015-02-28 VITALS — BP 122/78 | HR 82 | Temp 98.1°F | Resp 16 | Ht 61.0 in | Wt 170.0 lb

## 2015-02-28 DIAGNOSIS — H269 Unspecified cataract: Secondary | ICD-10-CM | POA: Diagnosis not present

## 2015-02-28 DIAGNOSIS — E1165 Type 2 diabetes mellitus with hyperglycemia: Secondary | ICD-10-CM

## 2015-02-28 DIAGNOSIS — I10 Essential (primary) hypertension: Secondary | ICD-10-CM | POA: Diagnosis not present

## 2015-02-28 DIAGNOSIS — M25511 Pain in right shoulder: Secondary | ICD-10-CM | POA: Diagnosis not present

## 2015-02-28 MED ORDER — INSULIN DETEMIR 100 UNIT/ML ~~LOC~~ SOLN: SUBCUTANEOUS | Status: DC

## 2015-02-28 MED ORDER — EMPAGLIFLOZIN 10 MG PO TABS: ORAL_TABLET | ORAL | Status: DC

## 2015-02-28 MED ORDER — NIFEDIPINE ER 60 MG PO TB24: 60.0000 mg | ORAL_TABLET | Freq: Every day | ORAL | Status: DC

## 2015-02-28 NOTE — Progress Notes (Signed)
Subjective:    Patient ID: Cynthia Mata, female    DOB: 28-Sep-1945, 69 y.o.   MRN: [id number]  HPI   Pt in with better bp today. She is walking daily 30 minutes a day. She is eating less and healthier foods. Less caffeine as well. When pt checks her bp it is like today level. Only one night her bp was over 140 systolic. They added only 1 extra of her clonidine. No cardiac or neurologic signs or symptoms.  Pt blood sugar still high at times. This morning fasting was 182. Sometimes late ate night after eating close to 200. Pt sees Dr. Lucianne Muss. Pt could not tolerate invokana. Pt is on lemvemir and jardiance. Pt needs referral to opthalmologist Dr. Betsy Coder for cataracts.  Pt has some occasional mild rt shoulder pain. It is better overall. Less frequent and less intense. Xray showed some mild degenerative changes. But still she wants to see sports med.     Review of Systems  Constitutional: Negative for fever, chills, diaphoresis, activity change and fatigue.  Eyes:       Cataracts.  Respiratory: Negative for cough, chest tightness and shortness of breath.   Cardiovascular: Negative for chest pain, palpitations and leg swelling.  Gastrointestinal: Negative for nausea, vomiting and abdominal pain.  Musculoskeletal: Negative for neck pain and neck stiffness.       Rt shoulder still some pain.  Neurological: Negative for dizziness, tremors, seizures, syncope, facial asymmetry, speech difficulty, weakness, light-headedness, numbness and headaches.  Psychiatric/Behavioral: Negative for behavioral problems, confusion and agitation. The patient is not nervous/anxious.    Past Medical History  Diagnosis Date  . Hypertension   . Diabetes mellitus without complication   . Macular degeneration   . Anemia   . Cataract   . Sleep apnea   . Hyperlipidemia   . Osteoporosis   . Thyroid disease   . Trapezius strain 10/23/2014    Social History   Social History  . Marital Status:  Married    Spouse Name: N/A  . Number of Children: N/A  . Years of Education: N/A   Occupational History  . Not on file.   Social History Main Topics  . Smoking status: Never Smoker   . Smokeless tobacco: Not on file  . Alcohol Use: No  . Drug Use: No  . Sexual Activity: Not on file   Other Topics Concern  . Not on file   Social History Narrative    Past Surgical History  Procedure Laterality Date  . Ankle fracture surgery      Right ankle  . Tonsillectomy      Abcess removed also    Family History  Problem Relation Age of Onset  . Hypertension Maternal Aunt   . Diabetes Neg Hx   . CAD Maternal Grandfather     No Known Allergies  Current Outpatient Prescriptions on File Prior to Visit  Medication Sig Dispense Refill  . atorvastatin (LIPITOR) 20 MG tablet Take 1 tablet (20 mg total) by mouth daily. (Patient taking differently: Take 20 mg by mouth daily at 6 PM. ) 30 tablet 3  . Canagliflozin-Metformin HCl (INVOKAMET) 50-1000 MG TABS 1 tab bid pc 60 tablet 3  . cloNIDine (CATAPRES) 0.1 MG tablet Take 1 tablet (0.1 mg total) by mouth 2 (two) times daily. 60 tablet 3  . cyclobenzaprine (FLEXERIL) 5 MG tablet 1 tab po q hs prn neck pain 5 tablet 0  . glucose blood test strip Use as  instructed 100 each 12  . Insulin Pen Needle 31G X 8 MM MISC 1 Device by Does not apply route 2 (two) times daily. 50 each 1  . insulin regular (HUMULIN R) 100 units/mL injection Take 20-30 minutes before eating.  Take 10 units at breakfast, 12 units at lunch, and 14 units before dinner. 10 mL 3  . Insulin Syringe-Needle U-100 30G X 5/16" 0.3 ML MISC Use to inject levemir daily at bedtime 30 each 3  . Lancets (ONETOUCH ULTRASOFT) lancets Use as instructed 100 each 12  . olmesartan-hydrochlorothiazide (BENICAR HCT) 40-12.5 MG per tablet Take 1 tablet by mouth daily. 30 tablet 1   No current facility-administered medications on file prior to visit.    BP 122/78 mmHg  Pulse 82  Temp(Src)  98.1 F (36.7 C) (Oral)  Resp 16  Ht  (1.549 m)  Wt 170 lb (77.111 kg)  BMI 32.14 kg/m2  SpO2 98%       Objective:   Physical Exam  General Mental Status- Alert. General Appearance- Not in acute distress.   Skin General: Color- Normal Color. Moisture- Normal Moisture.  Neck Carotid Arteries- Normal color. Moisture- Normal Moisture. No carotid bruits. No JVD.  Chest and Lung Exam Auscultation: Breath Sounds:-Normal.  Cardiovascular Auscultation:Rythm- Regular. Murmurs & Other Heart Sounds:Auscultation of the heart reveals- No Murmurs.  Abdomen Inspection:-Inspeection Normal. Palpation/Percussion:Note:No mass. Palpation and Percussion of the abdomen reveal- Non Tender, Non Distended + BS, no rebound or guarding.    Neurologic Cranial Nerve exam:- CN III-XII intact(No nystagmus), symmetric smile. Strength:- 5/5 equal and symmetric strength both upper and lower extremities.  Rt shoulder- good rom. No crepitus. Mild pain on palpation anterior aspect.      Assessment & Plan:  Your bp is well controlled. Continue healthy diet and exercise. Stay on same bp med regimen.   For your diabetes. Continue to follow up with Dr. Lucianne Muss. I did refill your medications today so you would not have gap in use of the med. If insurance issues with med then call Dr. Lucianne Muss office original prescriber.  I will refer you to sports medicine for your shoulder pain.  Also will refer to ophthalmologist for cataracts.  Follow up in 3 months. When follow up make early am appointment will check lipid panel on that day

## 2015-02-28 NOTE — Patient Instructions (Addendum)
Your bp is well controlled. Continue healthy diet and exercise. Stay on same bp med regimen.   For your diabetes. Continue to follow up with Dr. Lucianne Muss. I did refill your medications today so you would not have gap in use of the med. If insurance issues with med then call Dr. Lucianne Muss office original prescriber.  I will refer you to sports medicine for your shoulder pain.  Also will refer to ophthalmologist for cataracts.  Follow up in 3 months. When follow up make early am appointment will check lipid panel on that day.

## 2015-02-28 NOTE — Progress Notes (Signed)
Pre visit review using our clinic review tool, if applicable. No additional management support is needed unless otherwise documented below in the visit note. 

## 2015-03-01 ENCOUNTER — Encounter: Payer: Self-pay | Admitting: Family Medicine

## 2015-03-01 ENCOUNTER — Ambulatory Visit (INDEPENDENT_AMBULATORY_CARE_PROVIDER_SITE_OTHER): Payer: 59 | Admitting: Family Medicine

## 2015-03-01 VITALS — BP 127/77 | HR 77 | Ht 61.0 in | Wt 170.0 lb

## 2015-03-01 DIAGNOSIS — M25511 Pain in right shoulder: Secondary | ICD-10-CM

## 2015-03-01 NOTE — Patient Instructions (Signed)
You may have fractured your scapula with the fall a year ago - this would explain the bump/callus in the area that's more prominent than the left side. However, your pain should be completely resolved at this point. Start physical therapy to regain your motion, strength, and decrease the pain. Ok to use heat 15 minutes at a time 3-4 times a day (ice is ok if this feels better than the heat). Ibuprofen or aleve as needed for pain and inflammation. Try to avoid reaching, lifting more than 15 pounds, and overhead motions. Follow up with me in 6 weeks for reevaluation.

## 2015-03-06 NOTE — Assessment & Plan Note (Signed)
She has a more than typical prominence on right side mid-portion of scapular spine.  I do not appreciate a fracture on his radiographs.  Will start with physical therapy for right shoulder pain, strain of parascapular muscles.  Nsaids, heat as needed.  F/u in 6 weeks.  Avoid reaching, lifting more than 15 pounds, overhead motions.

## 2015-03-06 NOTE — Progress Notes (Signed)
PCP and referred by: Esperanza Richters, PA-C  Subjective:   HPI: Patient is a 69 y.o. female here for right shoulder pain.  Patient reports a year ago she fell directly onto right side. Broke foot with that fall. Has had some persistent swelling and pain posterior right shoulder with a bump here. Pain 2/10 at rest still with pain up to 8/10 level. Radiographs negative for fracture. + night pain. Right handed. No other injuries.  Past Medical History  Diagnosis Date  . Hypertension   . Diabetes mellitus without complication   . Macular degeneration   . Anemia   . Cataract   . Sleep apnea   . Hyperlipidemia   . Osteoporosis   . Thyroid disease   . Trapezius strain 10/23/2014    Current Outpatient Prescriptions on File Prior to Visit  Medication Sig Dispense Refill  . atorvastatin (LIPITOR) 20 MG tablet Take 1 tablet (20 mg total) by mouth daily. (Patient taking differently: Take 20 mg by mouth daily at 6 PM. ) 30 tablet 3  . Canagliflozin-Metformin HCl (INVOKAMET) 50-1000 MG TABS 1 tab bid pc 60 tablet 3  . cloNIDine (CATAPRES) 0.1 MG tablet Take 1 tablet (0.1 mg total) by mouth 2 (two) times daily. 60 tablet 3  . cyclobenzaprine (FLEXERIL) 5 MG tablet 1 tab po q hs prn neck pain 5 tablet 0  . empagliflozin (JARDIANCE) 10 MG TABS tablet Take 1 tablet daily 30 tablet 2  . glucose blood test strip Use as instructed 100 each 12  . insulin detemir (LEVEMIR) 100 UNIT/ML injection Inject 30 units at bedtime 10 mL 3  . Insulin Pen Needle 31G X 8 MM MISC 1 Device by Does not apply route 2 (two) times daily. 50 each 1  . insulin regular (HUMULIN R) 100 units/mL injection Take 20-30 minutes before eating.  Take 10 units at breakfast, 12 units at lunch, and 14 units before dinner. 10 mL 3  . Insulin Syringe-Needle U-100 30G X 5/16" 0.3 ML MISC Use to inject levemir daily at bedtime 30 each 3  . Lancets (ONETOUCH ULTRASOFT) lancets Use as instructed 100 each 12  . NIFEdipine  (PROCARDIA-XL/ADALAT CC) 60 MG 24 hr tablet Take 1 tablet (60 mg total) by mouth daily. 30 tablet 2  . olmesartan-hydrochlorothiazide (BENICAR HCT) 40-12.5 MG per tablet Take 1 tablet by mouth daily. 30 tablet 1   No current facility-administered medications on file prior to visit.    Past Surgical History  Procedure Laterality Date  . Ankle fracture surgery      Right ankle  . Tonsillectomy      Abcess removed also    No Known Allergies  Social History   Social History  . Marital Status: Married    Spouse Name: N/A  . Number of Children: N/A  . Years of Education: N/A   Occupational History  . Not on file.   Social History Main Topics  . Smoking status: Never Smoker   . Smokeless tobacco: Not on file  . Alcohol Use: No  . Drug Use: No  . Sexual Activity: Not on file   Other Topics Concern  . Not on file   Social History Narrative    Family History  Problem Relation Age of Onset  . Hypertension Maternal Aunt   . Diabetes Neg Hx   . CAD Maternal Grandfather     BP 127/77 mmHg  Pulse 77  Ht  (1.549 m)  Wt 170 lb (77.111 kg)  BMI  32.14 kg/m2  Review of Systems: See HPI above.    Objective:  Physical Exam:  Gen: NAD  Right shoulder: Focal swelling at midportion of scapular spine.  No bruising, other deformity. FROM shoulder with pain only in posterior scapula area. Negative Hawkins, Neers. Negative Speeds, Yergasons. Strength 5/5 with empty can and resisted internal/external rotation. Negative apprehension. NV intact distally.    Assessment & Plan:  1. Right shoulder pain - She has a more than typical prominence on right side mid-portion of scapular spine.  I do not appreciate a fracture on his radiographs.  Will start with physical therapy for right shoulder pain, strain of parascapular muscles.  Nsaids, heat as needed.  F/u in 6 weeks.  Avoid reaching, lifting more than 15 pounds, overhead motions.

## 2015-03-20 ENCOUNTER — Telehealth: Payer: Self-pay | Admitting: Family Medicine

## 2015-03-20 NOTE — Telephone Encounter (Signed)
Spoke to daughter and told her that PT has been trying to contact patient. PT will contact to set appointment.

## 2015-03-22 ENCOUNTER — Encounter: Payer: Self-pay | Admitting: Physical Therapy

## 2015-03-22 ENCOUNTER — Ambulatory Visit: Payer: 59 | Attending: Family Medicine | Admitting: Physical Therapy

## 2015-03-22 DIAGNOSIS — R29898 Other symptoms and signs involving the musculoskeletal system: Secondary | ICD-10-CM

## 2015-03-22 DIAGNOSIS — M25611 Stiffness of right shoulder, not elsewhere classified: Secondary | ICD-10-CM | POA: Diagnosis present

## 2015-03-22 DIAGNOSIS — M25511 Pain in right shoulder: Secondary | ICD-10-CM | POA: Diagnosis not present

## 2015-03-22 NOTE — Therapy (Signed)
[facility] [address]  [address] [place], [place], [address] Phone: [phone]   Fax:  [phone]  Physical Therapy Evaluation  Patient Details  Name: Cynthia Mata MRN: [id number] Date of Birth: 12/15/1945 Referring Provider:  Lenda Kelp, MD  Encounter Date: 03/22/2015      PT End of Session - 03/22/15 0848    Visit Number 1   Number of Visits 12   Date for PT Re-Evaluation 05/03/15   PT Start Time 0847   PT Stop Time 0948   PT Time Calculation (min) 61 min      Past Medical History  Diagnosis Date  . Hypertension   . Diabetes mellitus without complication (HCC)   . Macular degeneration   . Anemia   . Cataract   . Sleep apnea   . Hyperlipidemia   . Osteoporosis   . Thyroid disease   . Trapezius strain 10/23/2014    Past Surgical History  Procedure Laterality Date  . Ankle fracture surgery      Right ankle  . Tonsillectomy      Abcess removed also    There were no vitals filed for this visit.  Visit Diagnosis:  Pain in joint of right shoulder  Shoulder weakness  Shoulder stiffness, right      Subjective Assessment - 03/22/15 0848    Subjective pt fell over 1 year ago and fractured R ankle requiring surgical fixation.  In addition, pt with R shoulder pain since the fall which had not been addressed until recently.  X-rays to shoulder normal.  After the fall, R shoulder was swollen and pt states feels as though still is but denies discoloration.  Pt states has difficulty sleeping due to pain.  Pt states R UE seems to get fatigued quickly and notes shaking in arm with things as simple as holding coffee.   Currently in Pain? Yes   Pain Score --  pain-free at rest (other than in bed), severe pain with reaching rating 9-10/10   Pain Location Shoulder   Pain Orientation Right   Pain Descriptors / Indicators Jabbing;Sharp   Pain Radiating Towards R upper scapula and extending down  lateral upper arm and into forearm.  Denies n/t.   Pain Onset More than a month ago   Pain Frequency Intermittent   Aggravating Factors  reaching with R UE   Pain Relieving Factors heat and ice            [facility] PT Assessment - 03/22/15 0001    Assessment   Medical Diagnosis R Shoulder Pain   Onset Date/Surgical Date 12/07/13   Hand Dominance Right   Balance Screen   Has the patient fallen in the past 6 months No   Has the patient had a decrease in activity level because of a fear of falling?  No   Is the patient reluctant to leave their home because of a fear of falling?  No   Observation/Other Assessments   Focus on Therapeutic Outcomes (FOTO)  52% limitation   ROM / Strength   AROM / PROM / Strength PROM   AROM   AROM Assessment Site Shoulder   Right/Left Shoulder Left   Right Shoulder Flexion 130 Degrees  pain   Right Shoulder ABduction 136 Degrees  pain   Left Shoulder Flexion 152 Degrees   Left Shoulder ABduction 150 Degrees   PROM   PROM Assessment Site Shoulder   Right/Left Shoulder Right  Right Shoulder Internal Rotation 35 Degrees  pain   Right Shoulder External Rotation 55 Degrees  pain   Strength   Strength Assessment Site Shoulder   Right/Left Shoulder Right   Right Shoulder Flexion 3+/5   Right Shoulder ABduction 3+/5   Palpation   Palpation comment TTP lateral to subacromial space and into R infraspinatus; small nodule palpable in R scapular spine.  Also TTP with increased tissue density in R pec and serratus anterior.          TODAY'S TREATMENT TherEx - standing ER Yellow TB 10x Standing IR Yellow TB 10x Standing Low Row Double Black TB 10x Standing Single Hand Low Row Double Black TB 10x 4 strip Kinesiotape to R Shoulder: 30% anterior and posterior delt, 75% subacromial space, 50% lateral delt and into upper trap          PT Education - 03/22/15 0938    Education provided Yes   Education Details initial HEP   Person(s) Educated  Patient;Child(ren)   Methods Explanation;Demonstration;Handout   Comprehension Verbalized understanding;Returned demonstration          PT Short Term Goals - 03/22/15 0957    PT SHORT TERM GOAL #1   Title pt independent with initial HEP by 04/05/15   Status New           PT Long Term Goals - 03/22/15 0958    PT LONG TERM GOAL #1   Title pt independent with advanced HEP as necessary by 05/03/15   Status New   PT LONG TERM GOAL #2   Title R Shoulder AROM within 5 degrees of L all planes by 05/03/15   Status New   PT LONG TERM GOAL #3   Title R Shoulder MMT 4+/5 or better all planes by 05/03/15   Status New   PT LONG TERM GOAL #4   Title pt able sleep without limitation by shoulder pain by 05/03/15   Status New   PT LONG TERM GOAL #5   Title pt able to perform all ADLs, chores, and recreational activities without limitation by shoulder pain by 05/03/15   Status New               Plan - 03/22/15 0950    Clinical Impression Statement pt with R shoulder pain since falling over 1 year ago.  Shoulder x-rays normal. Palpation reveals tenderness to superior and posterior RC insertions as well as into infraspin and supraspin muscle bellies.  Pt also with tenderness and increased tone noted to R pecs and serratus anterior which seem guarding in nature.  R Shoulder PROM and AROM limited by pain with AROM FLexion and ABD to approx 130 (vs 150 ish on L) and ER and IR PROM limited to 55 and 35 respectively due to pain.  Special testing POS with empty and full can for pain but is able to hold against resistance; resited ER also painful and able to hold against resistance.  There could be a small nodule on R scapular spine compared to L indicating past healed fx but x-rays noted nothing.  Current s/s seem to indicate impingement syndrome with possible mild RC injury/strain.   Pt will benefit from skilled therapeutic intervention in order to improve on the following deficits Pain;Decreased  strength;Postural dysfunction;Improper body mechanics;Impaired flexibility;Decreased range of motion;Impaired UE functional use   Rehab Potential Good   PT Frequency 2x / week   PT Duration 6 weeks   PT Treatment/Interventions Manual techniques;Therapeutic exercise;Therapeutic activities;Vasopneumatic Device;Taping;Dry needling;Patient/family  education;Ultrasound;Electrical Stimulation;Moist Heat   PT Next Visit Plan rc strengthening and AROM to tolerance; manual and modalities PRN for pain reduction and normalizing tone to pec and serratus   Consulted and Agree with Plan of Care Patient;Family member/caregiver   Family Member Consulted daughter         Problem List Patient Active Problem List   Diagnosis Date Noted  . Pain in joint, shoulder region 11/28/2014  . Trapezius strain 10/23/2014  . Lymphadenopathy 10/23/2014  . Chest pressure 08/11/2014  . Vision changes 08/11/2014  . Chest pain 08/11/2014  . Diabetes mellitus type 2, controlled (HCC) 08/11/2014  . Pain in the chest   . Anemia 06/29/2014  . Hypothyroid 06/29/2014  . Hyperlipidemia 06/29/2014  . Sinusitis, acute maxillary 06/29/2014  . DM (diabetes mellitus) (HCC) 05/31/2013  . Essential hypertension, benign 05/31/2013    Samaya Boardley PT, OCS 03/22/2015, 10:00 AM  [facility] [address]  [address] [place], [place], [address] Phone: [phone]   Fax:  [phone]

## 2015-04-02 ENCOUNTER — Ambulatory Visit: Payer: 59 | Admitting: Physical Therapy

## 2015-04-02 DIAGNOSIS — M25511 Pain in right shoulder: Secondary | ICD-10-CM | POA: Diagnosis not present

## 2015-04-02 DIAGNOSIS — M25611 Stiffness of right shoulder, not elsewhere classified: Secondary | ICD-10-CM

## 2015-04-02 DIAGNOSIS — R29898 Other symptoms and signs involving the musculoskeletal system: Secondary | ICD-10-CM

## 2015-04-02 NOTE — Therapy (Signed)
[facility] [place] [address]  [address] [place], [place], [address] Phone: [phone]   Fax:  [phone]  Physical Therapy Treatment  Patient Details  Name: Cynthia Mata MRN: [id number] Date of Birth: 07/30/1945 Referring Provider: Norton BlizzardHudnall, Shane  Encounter Date: 04/02/2015      PT End of Session - 04/02/15 0849    Visit Number 2   Number of Visits 12   Date for PT Re-Evaluation 05/03/15   PT Start Time 0846   PT Stop Time 0923   PT Time Calculation (min) 37 min      Past Medical History  Diagnosis Date  . Hypertension   . Diabetes mellitus without complication (HCC)   . Macular degeneration   . Anemia   . Cataract   . Sleep apnea   . Hyperlipidemia   . Osteoporosis   . Thyroid disease   . Trapezius strain 10/23/2014    Past Surgical History  Procedure Laterality Date  . Ankle fracture surgery      Right ankle  . Tonsillectomy      Abcess removed also    There were no vitals filed for this visit.  Visit Diagnosis:  Pain in joint of right shoulder  Shoulder weakness  Shoulder stiffness, right      Subjective Assessment - 04/02/15 0850    Subjective states shoulder has been feeling better since initial eval. States is able to reach and use R UE more.  Has performed HEP some. Sleeping is much better.   Currently in Pain? Yes   Pain Score 4    Pain Location Shoulder   Pain Orientation Right            [facility] PT Assessment - 04/02/15 0001    Assessment   Referring Provider Norton BlizzardHudnall, Shane        TODAY'S TREATMENT TherEx - UBE lvl 1.0 1'/1'  Manual - Grade 2 AP and Caudal glides with gentle PROM to R Shoulder  TherEx - Supine R Shoulder CW/CCW in 90 Flexion 3# 10x each Supine Pullover 3# 12x L Side-Lying R ER AROM 15x (no c/o pain) L Side-Lying R Horz ABD AROM 10x (mild pain) Seated Shoulder rolls 12x (difficulty with retraction) Low Row 15# 2x10 Standing ER Yellow TB  15x Pulleys: Scaption 15x Hand on Wall Shoulder Flexion Slide 10x  4 strip Kinesiotape to R Shoulder: 30% anterior and posterior delt, 75% subacromial space, 50% lateral delt and into upper trap             PT Short Term Goals - 04/02/15 [id number]    PT SHORT TERM GOAL #1   Title pt independent with initial HEP by 04/05/15   Status Achieved           PT Long Term Goals - 04/02/15 0928    PT LONG TERM GOAL #1   Title pt independent with advanced HEP as necessary by 05/03/15   Status On-going   PT LONG TERM GOAL #2   Title R Shoulder AROM within 5 degrees of L all planes by 05/03/15   Status On-going   PT LONG TERM GOAL #3   Title R Shoulder MMT 4+/5 or better all planes by 05/03/15   Status On-going   PT LONG TERM GOAL #4   Title pt able sleep without limitation by shoulder pain by 05/03/15   Status On-going   PT LONG TERM GOAL #5   Title pt able to perform all ADLs,  chores, and recreational activities without limitation by shoulder pain by 05/03/15   Status On-going               Plan - 04/02/15 0924    Clinical Impression Statement pt with significant improvement since initial eval.  She reports less pain and states is able to use arm and raise arm with greater ease.  States sleep has improved as well but still some intermittent difficulty.  Was able to perform pulleys and hand on wall flexion with excellent ROM today and able to donn jacket without difficulty.  She continues to note pain and is still TTP to R lateral shoulder but improved.  Pt reports good benefit from tape so this was re-applied after today's treatment.   PT Next Visit Plan RC strengthening and AROM to tolerance; manual and modalities PRN.  May increase HEP?        Problem List Patient Active Problem List   Diagnosis Date Noted  . Pain in joint, shoulder region 11/28/2014  . Trapezius strain 10/23/2014  . Lymphadenopathy 10/23/2014  . Chest pressure 08/11/2014  . Vision changes  08/11/2014  . Chest pain 08/11/2014  . Diabetes mellitus type 2, controlled (HCC) 08/11/2014  . Pain in the chest   . Anemia 06/29/2014  . Hypothyroid 06/29/2014  . Hyperlipidemia 06/29/2014  . Sinusitis, acute maxillary 06/29/2014  . DM (diabetes mellitus) (HCC) 05/31/2013  . Essential hypertension, benign 05/31/2013    [facility] PT, OCS 04/02/2015, 9:29 AM  [facility] [address]  [address] [place], [place], [address] Phone: [phone]   Fax:  [phone]  Name: Markel Mergenthaler MRN: [id number] Date of Birth: Jul 21, 1945

## 2015-04-05 ENCOUNTER — Ambulatory Visit: Payer: 59

## 2015-04-09 ENCOUNTER — Telehealth: Payer: Self-pay | Admitting: *Deleted

## 2015-04-09 DIAGNOSIS — I447 Left bundle-branch block, unspecified: Secondary | ICD-10-CM

## 2015-04-09 DIAGNOSIS — I10 Essential (primary) hypertension: Secondary | ICD-10-CM

## 2015-04-09 DIAGNOSIS — Z87898 Personal history of other specified conditions: Secondary | ICD-10-CM

## 2015-04-09 NOTE — Telephone Encounter (Signed)
Surgical clearance form received via fax from [facility] for cataract extraction of the left eye followed by the right eye. Forwarded to Harrah's EntertainmentEdward. JG//CMA

## 2015-04-10 ENCOUNTER — Ambulatory Visit: Payer: 59 | Attending: Family Medicine | Admitting: Physical Therapy

## 2015-04-10 DIAGNOSIS — R29898 Other symptoms and signs involving the musculoskeletal system: Secondary | ICD-10-CM

## 2015-04-10 DIAGNOSIS — M25511 Pain in right shoulder: Secondary | ICD-10-CM | POA: Insufficient documentation

## 2015-04-10 DIAGNOSIS — M25611 Stiffness of right shoulder, not elsewhere classified: Secondary | ICD-10-CM | POA: Insufficient documentation

## 2015-04-10 NOTE — Therapy (Signed)
[facility] [address]  [address] [place], [place], [address] Phone: [phone]   Fax:  [phone]  Physical Therapy Treatment  Patient Details  Name: Cynthia Mata MRN: [id number] Date of Birth: 02-15-1946 Referring Provider: Norton Blizzard  Encounter Date: 04/10/2015      PT End of Session - 04/10/15 0924    Visit Number 3   Number of Visits 12   Date for PT Re-Evaluation 05/03/15   PT Start Time 0849   PT Stop Time 0936   PT Time Calculation (min) 47 min   Activity Tolerance Patient tolerated treatment well   Behavior During Therapy [facility] for tasks assessed/performed      Past Medical History  Diagnosis Date  . Hypertension   . Diabetes mellitus without complication (HCC)   . Macular degeneration   . Anemia   . Cataract   . Sleep apnea   . Hyperlipidemia   . Osteoporosis   . Thyroid disease   . Trapezius strain 10/23/2014    Past Surgical History  Procedure Laterality Date  . Ankle fracture surgery      Right ankle  . Tonsillectomy      Abcess removed also    There were no vitals filed for this visit.  Visit Diagnosis:  Pain in joint of right shoulder  Shoulder weakness  Shoulder stiffness, right      Subjective Assessment - 04/10/15 0852    Subjective having pain R middle deltoid today; sore to touch   Currently in Pain? Yes   Pain Score 6    Pain Location Shoulder   Pain Orientation Right;Lateral   Pain Descriptors / Indicators Sore                         [facility] Adult PT Treatment/Exercise - 04/10/15 0853    Exercises   Exercises Shoulder   Shoulder Exercises: ROM/Strengthening   UBE (Upper Arm Bike) L1.0 x 4 min; 2'forward/2' backward   Modalities   Modalities Ultrasound;Moist Heat   Moist Heat Therapy   Number Minutes Moist Heat 10 Minutes   Moist Heat Location Shoulder   Ultrasound   Ultrasound Location R middle deltoid   Ultrasound Parameters  100% DC, 1.0 w/cm2, freq x 8 min   Ultrasound Goals Pain   Manual Therapy   Manual Therapy Soft tissue mobilization   Soft tissue mobilization R middle deltoid with trigger point release                  PT Short Term Goals - 04/02/15 0927    PT SHORT TERM GOAL #1   Title pt independent with initial HEP by 04/05/15   Status Achieved           PT Long Term Goals - 04/02/15 0928    PT LONG TERM GOAL #1   Title pt independent with advanced HEP as necessary by 05/03/15   Status On-going   PT LONG TERM GOAL #2   Title R Shoulder AROM within 5 degrees of L all planes by 05/03/15   Status On-going   PT LONG TERM GOAL #3   Title R Shoulder MMT 4+/5 or better all planes by 05/03/15   Status On-going   PT LONG TERM GOAL #4   Title pt able sleep without limitation by shoulder pain by 05/03/15   Status On-going   PT LONG TERM GOAL #5   Title pt able  to perform all ADLs, chores, and recreational activities without limitation by shoulder pain by 05/03/15   Status On-going               Plan - 04/10/15 0924    Clinical Impression Statement Pt presents today with new onset of R middle deltoid muscle pain with palpable tightness and tenderness.  Session focused on pain control of that area as pt stated posterior shoulder felt fine today.     PT Next Visit Plan RC strengthening and AROM to tolerance; manual and modalities PRN.  May increase HEP?   Consulted and Agree with Plan of Care Patient;Family member/caregiver   Family Member Consulted daughter        Problem List Patient Active Problem List   Diagnosis Date Noted  . Pain in joint, shoulder region 11/28/2014  . Trapezius strain 10/23/2014  . Lymphadenopathy 10/23/2014  . Chest pressure 08/11/2014  . Vision changes 08/11/2014  . Chest pain 08/11/2014  . Diabetes mellitus type 2, controlled (HCC) 08/11/2014  . Pain in the chest   . Anemia 06/29/2014  . Hypothyroid 06/29/2014  . Hyperlipidemia  06/29/2014  . Sinusitis, acute maxillary 06/29/2014  . DM (diabetes mellitus) (HCC) 05/31/2013  . Essential hypertension, benign 05/31/2013   Cynthia Mata, PT, DPT 04/10/2015 9:50 AM  [facility] [place] [address]  [address] [place], [place], [address] Phone: [phone]   Fax:  [phone]  Name: Cynthia Mata MRN: [id number] Date of Birth: 10/15/1945

## 2015-04-12 ENCOUNTER — Encounter: Payer: 59 | Admitting: Physical Therapy

## 2015-04-13 ENCOUNTER — Ambulatory Visit: Payer: 59 | Admitting: Family Medicine

## 2015-04-18 NOTE — Telephone Encounter (Signed)
Pt eye MD wants her to have clearance before surgery. With hx of chest pain, htn, diabetes and ekg findings in past will try to refer to cardiologist quickly. Pt spanish speaker so will you call and notify we are working on referral.

## 2015-04-18 NOTE — Telephone Encounter (Signed)
Spoke with pt and informed the below pt understood and asked if there is any further calls for her referral appt to please call at [phone] (daughter Minta phone #).

## 2015-04-19 ENCOUNTER — Telehealth: Payer: Self-pay | Admitting: Medical

## 2015-04-19 DIAGNOSIS — I10 Essential (primary) hypertension: Secondary | ICD-10-CM

## 2015-04-19 MED ORDER — OLMESARTAN MEDOXOMIL-HCTZ 40-12.5 MG PO TABS: 1.0000 | ORAL_TABLET | Freq: Every day | ORAL | Status: DC

## 2015-04-19 NOTE — Telephone Encounter (Signed)
Caller name: Araminta Relationship to patient: daughter Can be reached: [phone] [id number] [place] [place] - [address]  Reason for call: Pt is out of benicar. She is needing refill and pharmacy told them to call MD office.

## 2015-04-19 NOTE — Telephone Encounter (Signed)
Spoke with pt's daughter and she voices understanding. Pt daughter Virgina Jockraminta will call back to set up an appointment.

## 2015-04-19 NOTE — Addendum Note (Signed)
Addended by: Neldon LabellaMABE, Leldon Steege S on: 04/19/2015 11:41 AM   Modules accepted: Orders

## 2015-04-20 ENCOUNTER — Ambulatory Visit (INDEPENDENT_AMBULATORY_CARE_PROVIDER_SITE_OTHER): Payer: 59 | Admitting: Cardiology

## 2015-04-20 ENCOUNTER — Encounter: Payer: Self-pay | Admitting: Cardiology

## 2015-04-20 VITALS — BP 152/74 | HR 70 | Ht 62.0 in | Wt 173.0 lb

## 2015-04-20 DIAGNOSIS — I10 Essential (primary) hypertension: Secondary | ICD-10-CM

## 2015-04-20 DIAGNOSIS — I447 Left bundle-branch block, unspecified: Secondary | ICD-10-CM | POA: Diagnosis not present

## 2015-04-20 NOTE — Telephone Encounter (Signed)
I reviewed cardiologist not regarding pre-surgical evaluation for cataract surgery. I signed and placed it in yellow folder. Please advise pt she can pick it up.

## 2015-04-20 NOTE — Progress Notes (Signed)
Cardiology Office Note   Date:  04/20/2015   ID:  Miguelina Fore [place]rington, [place] 1945/11/15, MRN [id number]  PCP:  Esperanza Richters, PA-C  Cardiologist:   Rollene Rotunda, MD   Chief Complaint  Patient presents with  . Pre-op Exam  . LBBB      History of Present Illness: Cynthia Mata is a 69 y.o. female who presents for preoperative evaluation prior to eye surgery.   She has an abnormal EKG with left bundle branch block. However, I was able to discovered that she's had previous stress testing. In 12 2015 she had a perfusion study with an EF of 58% and no ischemia. Of note is also mention in previous hospitalization that she had a normal echo. I did review hospital records from March of last year.   The patient feels well. She does have diabetes with a hemoglobin A1c greater than 7 by report. She has some chronic dyspnea with exertion but this is baseline. She has no resting shortness of breath. She has no palpitations, presyncope or syncope. She has no PND or orthopnea. She's had no weight gain and has some mild chronic right leg edema where she broke her leg.  Past Medical History  Diagnosis Date  . Hypertension   . Diabetes mellitus without complication (HCC)   . Macular degeneration   . Anemia   . Cataract   . Hyperlipidemia   . Osteoporosis   . Thyroid disease   . Trapezius strain 10/23/2014    Past Surgical History  Procedure Laterality Date  . Ankle fracture surgery      Right ankle  . Tonsillectomy      Abcess removed also     Current Outpatient Prescriptions  Medication Sig Dispense Refill  . empagliflozin (JARDIANCE) 10 MG TABS tablet Take 1 tablet daily 30 tablet 2  . insulin detemir (LEVEMIR) 100 UNIT/ML injection Inject 30 units at bedtime 10 mL 3  . NIFEdipine (PROCARDIA-XL/ADALAT CC) 60 MG 24 hr tablet Take 1 tablet (60 mg total) by mouth daily. 30 tablet 2  . olmesartan-hydrochlorothiazide (BENICAR HCT) 40-12.5 MG tablet Take 1 tablet by  mouth daily. 15 tablet 0   No current facility-administered medications for this visit.    Allergies:   Review of patient's allergies indicates no known allergies.    Social History:  The patient  reports that she has never smoked. She does not have any smokeless tobacco history on file. She reports that she does not drink alcohol or use illicit drugs.   Family History:  The patient's family history includes CAD in her maternal grandfather; Hypertension in her maternal aunt. There is no history of Diabetes.    She does not know her father's family history.   ROS:  Please see the history of present illness.   Otherwise, review of systems are positive for constipation.   All other systems are reviewed and negative.    PHYSICAL EXAM: VS:  BP 152/74 mmHg  Pulse 70  Ht  (1.575 m)  Wt 173 lb (78.472 kg)  BMI 31.63 kg/m2 , BMI Body mass index is 31.63 kg/(m^2). GENERAL:  Well appearing HEENT:  Pupils equal round and reactive, fundi not visualized, oral mucosa unremarkable NECK:  No jugular venous distention, waveform within normal limits, carotid upstroke brisk and symmetric, no bruits, no thyromegaly LYMPHATICS:  No cervical, inguinal adenopathy LUNGS:  Clear to auscultation bilaterally BACK:  No CVA tenderness CHEST:  Unremarkable HEART:  PMI not displaced  or sustained,S1 and S2 fixed and split, no S3, no S4, no clicks, no rubs, no murmurs ABD:  Flat, positive bowel sounds normal in frequency in pitch, no bruits, no rebound, no guarding, no midline pulsatile mass, no hepatomegaly, no splenomegaly EXT:  2 plus pulses throughout, no edema, no cyanosis no clubbing SKIN:  No rashes no nodules NEURO:  Cranial nerves II through XII grossly intact, motor grossly intact throughout PSYCH:  Cognitively intact, oriented to person place and time    EKG:  EKG is ordered today. The ekg ordered today demonstrates  Sinus rhythm, rate 70, left bundle branch block, no change from  previous   Recent Labs: 08/11/2014: B Natriuretic Peptide 19.6; TSH 4.753* 10/23/2014: ALT 19; BUN 18; Creat 0.75; Hemoglobin 11.3*; Platelets 303.0; Potassium 3.8; Sodium 139    Lipid Panel    Component Value Date/Time   CHOL 146 08/12/2014 0118   TRIG 179* 08/12/2014 0118   HDL 35* 08/12/2014 0118   CHOLHDL 4.2 08/12/2014 0118   VLDL 36 08/12/2014 0118   LDLCALC 75 08/12/2014 0118      Wt Readings from Last 3 Encounters:  04/20/15 173 lb (78.472 kg)  03/01/15 170 lb (77.111 kg)  02/28/15 170 lb (77.111 kg)      Other studies Reviewed: Additional studies/ records that were reviewed today include: Hospital records 2013 and previous [facility] Review of the above records demonstrates:  Please see elsewhere in the note.     ASSESSMENT AND PLAN:  LBBB:   This is chronic. She has no acute symptoms. She had a negative stress test last year. No further evaluation is indicated.  HTN:   Her blood pressure is mildly elevated today but she did not have one of her medicines. I looked back at previous blood pressures are well controlled. No change in therapy is indicated.  OVERWEIGHT:  The patient understands the need to lose weight with diet and exercise. We have discussed specific strategies for this.  PREOP:   The patient is at acceptable risk for the planned surgery. No further testing is indicated.   Current medicines are reviewed at length with the patient today.  The patient does not have concerns regarding medicines.  The following changes have been made:  no change  Labs/ tests ordered today include:   Orders Placed This Encounter  Procedures  . EKG 12-Lead     Disposition:   FU with me as needed.      Signed, Rollene RotundaJames Vincent Ehrler, MD  04/20/2015 11:07 AM    [facility]

## 2015-04-20 NOTE — Patient Instructions (Signed)
Your physician recommends that you schedule a follow-up appointment As Needed  

## 2015-04-23 ENCOUNTER — Encounter: Payer: Self-pay | Admitting: Cardiology

## 2015-04-23 ENCOUNTER — Telehealth: Payer: Self-pay | Admitting: Medical

## 2015-04-23 NOTE — Telephone Encounter (Signed)
Spoke with Cynthia Mata and advised her that the form was ready for pick up at the front office. Copy was sent to scan.

## 2015-04-23 NOTE — Telephone Encounter (Signed)
Notified pt's daughter about discount card online to give a 30 day supply until pt can be seen in office.

## 2015-04-23 NOTE — Telephone Encounter (Signed)
I would recommend that they go on good http://[email or web address]/[email or web address] and print off coupon for benicar-hct for [facility].  It looks like he can get a 1 month supply for 59 dollars while we await PA. If that is not financially feasible we will need to change his med all together.  Please also contact [facility] and request that they refax the PA form.

## 2015-04-23 NOTE — Telephone Encounter (Signed)
We are awaiting the fax from the pharmacy for the prior authorization. Pt has been out for 5 days. Please advise.

## 2015-04-23 NOTE — Telephone Encounter (Signed)
Pt's daughter states that [facility] informed her that her insurance is requesting PA for the rx benicar, states she does not understand why because pt has been on the rx for over a year. Pt has been out of bp med for 5 days now, states [facility] states they have faxed over the form on Friday.

## 2015-04-24 ENCOUNTER — Ambulatory Visit: Payer: 59 | Admitting: Physical Therapy

## 2015-04-24 DIAGNOSIS — M25511 Pain in right shoulder: Secondary | ICD-10-CM | POA: Diagnosis not present

## 2015-04-24 DIAGNOSIS — M25611 Stiffness of right shoulder, not elsewhere classified: Secondary | ICD-10-CM

## 2015-04-24 DIAGNOSIS — R29898 Other symptoms and signs involving the musculoskeletal system: Secondary | ICD-10-CM

## 2015-04-24 NOTE — Therapy (Addendum)
[facility] [address]  [place], [place], [address] Phone: [phone]   Fax:  [phone]  Physical Therapy Treatment  Patient Details  Name: Cynthia Mata MRN: [id number] Date of Birth: Oct 27, 1945 Referring Provider: Karlton Lemon  Encounter Date: 04/24/2015      PT End of Session - 04/24/15 0930    Visit Number 4   Number of Visits 12   Date for PT Re-Evaluation 05/03/15   PT Start Time 1749  pt arrived late   PT Stop Time 0930   PT Time Calculation (min) 36 min   Activity Tolerance Patient tolerated treatment well   Behavior During Therapy [facility] for tasks assessed/performed      Past Medical History  Diagnosis Date  . Hypertension   . Diabetes mellitus without complication ([place])   . Macular degeneration   . Anemia   . Cataract   . Hyperlipidemia   . Osteoporosis   . Thyroid disease   . Trapezius strain 10/23/2014    Past Surgical History  Procedure Laterality Date  . Ankle fracture surgery      Right ankle  . Tonsillectomy      Abcess removed also    There were no vitals filed for this visit.  Visit Diagnosis:  Pain in joint of right shoulder  Shoulder weakness  Shoulder stiffness, right      Subjective Assessment - 04/24/15 0855    Subjective R middle deltoid still painful, but better.  Has been doing a lot of outdoor work (sweeping, raking leaves) which may have aggravated it.   Currently in Pain? Yes   Pain Score 6    Pain Location Shoulder   Pain Orientation Right;Lateral   Pain Descriptors / Indicators Sore   Pain Onset More than a month ago   Pain Frequency Intermittent   Aggravating Factors  reaching with R UE   Pain Relieving Factors heat and ice                         [facility] Adult PT Treatment/Exercise - 04/24/15 0857    Shoulder Exercises: ROM/Strengthening   UBE (Upper Arm Bike) L2.5 x 6 min (3' forward/3' min backwards)   Modalities    Modalities Ultrasound;Moist Heat   Moist Heat Therapy   Number Minutes Moist Heat 10 Minutes   Moist Heat Location Shoulder   Ultrasound   Ultrasound Location R middle deloid   Ultrasound Parameters 100% DC, 1.2 w/cm2, 36mz freq x 8 min   Ultrasound Goals Pain                  PT Short Term Goals - 04/02/15 04496   PT SHORT TERM GOAL #1   Title pt independent with initial HEP by 04/05/15   Status Achieved           PT Long Term Goals - 04/02/15 0928    PT LONG TERM GOAL #1   Title pt independent with advanced HEP as necessary by 05/03/15   Status On-going   PT LONG TERM GOAL #2   Title R Shoulder AROM within 5 degrees of L all planes by 05/03/15   Status On-going   PT LONG TERM GOAL #3   Title R Shoulder MMT 4+/5 or better all planes by 05/03/15   Status On-going   PT LONG TERM GOAL #4   Title pt able sleep without limitation by shoulder pain by  05/03/15   Status On-going   PT LONG TERM GOAL #5   Title pt able to perform all ADLs, chores, and recreational activities without limitation by shoulder pain by 05/03/15   Status On-going               Plan - 04/24/15 0930    Clinical Impression Statement Pt continues to reports pain in R middle deltoid.  Pt's daugther reports increased activity and use with RUE despite continued reports of pain.   PT Next Visit Plan RC strengthening and AROM to tolerance; manual and modalities PRN.  May increase HEP?   Consulted and Agree with Plan of Care Patient;Family member/caregiver   Family Member Consulted daughter        Problem List Patient Active Problem List   Diagnosis Date Noted  . LBBB (left bundle branch block) 04/20/2015  . Pain in joint, shoulder region 11/28/2014  . Trapezius strain 10/23/2014  . Lymphadenopathy 10/23/2014  . Chest pressure 08/11/2014  . Vision changes 08/11/2014  . Chest pain 08/11/2014  . Diabetes mellitus type 2, controlled ([place]) 08/11/2014  . Pain in the chest   . Anemia  06/29/2014  . Hypothyroid 06/29/2014  . Hyperlipidemia 06/29/2014  . Sinusitis, acute maxillary 06/29/2014  . DM (diabetes mellitus) ([place]) 05/31/2013  . Essential hypertension, benign 05/31/2013   Laureen Abrahams, PT, DPT 04/24/2015 9:43 AM  [facility] [address]  [place], [place], [address] Phone: [phone]   Fax:  [phone]  Name: Cynthia Mata MRN: [id number] Date of Birth: 1945/08/27    PHYSICAL THERAPY DISCHARGE SUMMARY  Visits from Start of Care: 4  Current functional level related to goals / functional outcomes: Some improvement in pain at last treatment but still limited level of function due to pain.  She was last seen on 04/24/15.  At that time she had only attended 4 treatments due to several cancellations and since her last appointment she cancelled several more appointments.  Her POC expired on 05/03/15 and she is therefore discharged from our care and will require new referral if she is to continue with PT.   Remaining deficits: unknown   Education / Equipment: HEP  Plan: Patient agrees to discharge.  Patient goals were not met. Patient is being discharged due to not returning since the last visit.  ?????        Leonette Most PT, OCS 06/28/2015 3:00 PM

## 2015-04-24 NOTE — Telephone Encounter (Signed)
PA initiated. Awaiting determination. JG//CMA 

## 2015-04-26 NOTE — Telephone Encounter (Signed)
Error/gd °

## 2015-04-30 NOTE — Telephone Encounter (Signed)
PA approved.

## 2015-05-08 ENCOUNTER — Ambulatory Visit: Payer: 59 | Admitting: Physical Therapy

## 2015-05-08 ENCOUNTER — Encounter: Payer: Self-pay | Admitting: Medical

## 2015-05-08 ENCOUNTER — Ambulatory Visit (INDEPENDENT_AMBULATORY_CARE_PROVIDER_SITE_OTHER): Payer: 59 | Admitting: Medical

## 2015-05-08 VITALS — BP 126/82 | HR 78 | Temp 98.4°F | Ht 62.0 in | Wt 168.0 lb

## 2015-05-08 DIAGNOSIS — I10 Essential (primary) hypertension: Secondary | ICD-10-CM | POA: Diagnosis not present

## 2015-05-08 DIAGNOSIS — L989 Disorder of the skin and subcutaneous tissue, unspecified: Secondary | ICD-10-CM | POA: Diagnosis not present

## 2015-05-08 DIAGNOSIS — Z1239 Encounter for other screening for malignant neoplasm of breast: Secondary | ICD-10-CM

## 2015-05-08 DIAGNOSIS — Z1211 Encounter for screening for malignant neoplasm of colon: Secondary | ICD-10-CM

## 2015-05-08 MED ORDER — OLMESARTAN MEDOXOMIL-HCTZ 40-12.5 MG PO TABS: 1.0000 | ORAL_TABLET | Freq: Every day | ORAL | Status: DC

## 2015-05-08 NOTE — Patient Instructions (Addendum)
Will refill your benicar. Will continue this since she has upcoming eye surgery and want her bp to remain controlled. Address possible change after eye surgery.  Continue with PT for your shoulder.  Referring to GI for colonoscopy but 3-4 months at your request.  Refer to gynecologist for second opinion your descibed vaginal concerns..  Follow up in 4 wks or as needed

## 2015-05-08 NOTE — Progress Notes (Signed)
Pre visit review using our clinic review tool, if applicable. No additional management support is needed unless otherwise documented below in the visit note. 

## 2015-05-08 NOTE — Progress Notes (Signed)
Subjective:    Patient ID: Cynthia Mata, female    DOB: [birth year], 69 y.o.   MRN: [id number]  HPI  Pt in for bp check. Her bp is well controlled. No cardiac or neurologic signs or symptoms. Pt is walking daily about one mile to bring bp down. Also eating very little salt. She did have some problems getting [facility] her last rx of benicar. She will try our pharmacy today.  Pt had some rt shoulder pain and possible rt periscapular pain. Pt states shoulder is feeling a lot better. Pt has better range of motion with PT. Also a lot less pain.  It has been many years since mammogram. MA Francesco Runner will put in order today.  Pt had pap smear in past locally. 2 years since last exam. Pt wants to have pap smear again. Pt states she has redundant type skin outside vaginal area. Last gyn told was normal for her age. Pt wants second opinion.  Pt is willing to have colonoscopy. But wants to get it done in 3--4 months after her eye surgery.       Review of Systems  Constitutional: Negative for fever, chills, diaphoresis, activity change and fatigue.  Eyes:       Blurred vision. Gettng eye surgery May 17, 2015.   Respiratory: Negative for cough, chest tightness and shortness of breath.   Cardiovascular: Negative for chest pain, palpitations and leg swelling.  Gastrointestinal: Negative for nausea, vomiting and abdominal pain.  Musculoskeletal: Negative for neck pain and neck stiffness.       Rt shoulder a lot better now.  Neurological: Negative for dizziness, seizures, weakness and numbness.  Psychiatric/Behavioral: Negative for behavioral problems, confusion and agitation. The patient is not nervous/anxious.    Past Medical History  Diagnosis Date  . Hypertension   . Diabetes mellitus without complication ([facility])   . Macular degeneration   . Anemia   . Cataract   . Hyperlipidemia   . Osteoporosis   . Thyroid disease   . Trapezius strain 10/23/2014    Social History    Social History  . Marital Status: Married    Spouse Name: N/A  . Number of Children: 5  . Years of Education: N/A   Occupational History  . Not on file.   Social History Main Topics  . Smoking status: Never Smoker   . Smokeless tobacco: Not on file  . Alcohol Use: No  . Drug Use: No  . Sexual Activity: Not on file   Other Topics Concern  . Not on file   Social History Narrative   Lives with daughter.      Past Surgical History  Procedure Laterality Date  . Ankle fracture surgery      Right ankle  . Tonsillectomy      Abcess removed also    Family History  Problem Relation Age of Onset  . Hypertension Maternal Aunt   . Diabetes Neg Hx   . CAD Maternal Grandfather     No Known Allergies  Current Outpatient Prescriptions on File Prior to Visit  Medication Sig Dispense Refill  . empagliflozin (JARDIANCE) 10 MG TABS tablet Take 1 tablet daily 30 tablet 2  . insulin detemir (LEVEMIR) 100 UNIT/ML injection Inject 30 units at bedtime (Patient not taking: Reported on 05/08/2015) 10 mL 3  . NIFEdipine (PROCARDIA-XL/ADALAT CC) 60 MG 24 hr tablet Take 1 tablet (60 mg total) by mouth daily. (Patient not taking: Reported on 05/08/2015) 30 tablet  2   No current facility-administered medications on file prior to visit.    BP 126/82 mmHg  Pulse 78  Temp(Src) 98.4 F (36.9 C) (Oral)  Ht 5\' 2"  (1.575 m)  Wt 168 lb (76.204 kg)  BMI 30.72 kg/m2  SpO2 98%       Objective:   Physical Exam  General Mental Status- Alert. General Appearance- Not in acute distress.   Skin General: Color- Normal Color. Moisture- Normal Moisture.  Chest and Lung Exam Auscultation: Breath Sounds:-Normal.  Cardiovascular Auscultation:Rythm- Regular. Murmurs & Other Heart Sounds:Auscultation of the heart reveals- No Murmurs.    Neurologic Cranial Nerve exam:- CN III-XII intact(No nystagmus), symmetric smile. Strength:- 5/5 equal and symmetric strength both upper and lower  extremities.  Rt shoulder- no pain on palpation today. Good rom today with no pain.      Assessment & Plan:  Will refill your benicar. Will continue this since she has upcoming eye surgery and want her bp to remain controlled. Address possible change after eye surgery.  Continue with PT for your shoulder.  Referring to GI for colonoscopy but 3-4 months at your request.  Refer to gynecologist for second opinion your descibed vaginal concerns..  Follow up in 4 wks or as needed

## 2015-05-10 HISTORY — PX: OTHER SURGICAL HISTORY: SHX169

## 2015-05-10 NOTE — Telephone Encounter (Signed)
Left message for Cynthia Mata to call back about note below.

## 2015-05-10 NOTE — Telephone Encounter (Signed)
Caller name: Gabriel Rungroi Relation to pt: [facility]  Call back number: [phone]    Reason for call:  Checking on the status of surgical clearance form stated the form should have been faxed to office, please advise

## 2015-05-11 ENCOUNTER — Telehealth: Payer: Self-pay | Admitting: Medical

## 2015-05-11 NOTE — Telephone Encounter (Signed)
Troi returning your call, re faxing form to [phone] [facility]

## 2015-05-11 NOTE — Telephone Encounter (Signed)
I filled out surgical form for [facility] again 2nd time.

## 2015-05-11 NOTE — Telephone Encounter (Signed)
Left message for Gabriel Rungroi to call back if she has any questions.

## 2015-05-23 ENCOUNTER — Ambulatory Visit: Payer: 59 | Admitting: Physical Therapy

## 2015-05-28 ENCOUNTER — Encounter: Payer: Self-pay | Admitting: Obstetrics & Gynecology

## 2015-05-28 ENCOUNTER — Ambulatory Visit (INDEPENDENT_AMBULATORY_CARE_PROVIDER_SITE_OTHER): Payer: 59 | Admitting: Obstetrics & Gynecology

## 2015-05-28 VITALS — BP 164/60 | HR 67 | Ht 63.0 in | Wt 170.0 lb

## 2015-05-28 DIAGNOSIS — N899 Noninflammatory disorder of vagina, unspecified: Secondary | ICD-10-CM

## 2015-05-28 DIAGNOSIS — N898 Other specified noninflammatory disorders of vagina: Secondary | ICD-10-CM | POA: Diagnosis not present

## 2015-05-28 NOTE — Progress Notes (Signed)
   CLINIC ENCOUNTER NOTE  History:  69 y.o. PMP F here today for evaluation of redundant vaginal tissue noted during her previous pelvic exam two years. Does not cause any pain or discomfort, she is just concerned as she feels it during her bathing.  She denies any abnormal vaginal discharge, bleeding, pelvic pain or other concerns.  Has been menopausal since age 69.  She is accompanied by her daughter.  Past Medical History  Diagnosis Date  . Hypertension   . Diabetes mellitus without complication ([facility])   . Macular degeneration   . Anemia   . Cataract   . Hyperlipidemia   . Osteoporosis   . Thyroid disease   . Trapezius strain 10/23/2014    Past Surgical History  Procedure Laterality Date  . Ankle fracture surgery      Right ankle  . Tonsillectomy      Abcess removed also  . Cataracts  05-2015    The following portions of the patient's history were reviewed and updated as appropriate: allergies, current medications, past family history, past medical history, past social history, past surgical history and problem list.   Health Maintenance:  Normal pap and negative HRHPV in 2014, no previous abnormal pap smears.  Ordered for mammogram.  Review of Systems:  Pertinent items noted in HPI and remainder of comprehensive ROS otherwise negative.  Objective:  Physical Exam BP 164/60 mmHg  Pulse 67  Ht 5\' 3"  (1.6 m)  Wt 170 lb (77.111 kg)  BMI 30.12 kg/m2 CONSTITUTIONAL: Well-developed, well-nourished female in no acute distress.  HENT:  Normocephalic, atraumatic. External right and left ear normal. Oropharynx is clear and moist EYES: Conjunctivae and EOM are normal. Pupils are equal, round, and reactive to light. No scleral icterus.  NECK: Normal range of motion, supple, no masses SKIN: Skin is warm and dry. No rash noted. Not diaphoretic. No erythema. No pallor. NEUROLGIC: Alert and oriented to person, place, and time. Normal reflexes, muscle tone coordination. No cranial nerve  deficit noted. PSYCHIATRIC: Normal mood and affect. Normal behavior. Normal judgment and thought content. CARDIOVASCULAR: Normal heart rate noted RESPIRATORY: Effort and breath sounds normal, no problems with respiration noted ABDOMEN: Soft, no distention noted.   PELVIC: Normal appearing external genitalia with moderate atrophy noted.  Patient told to point to abnormal tissue, she pointed to hymenal remnant at 6 o'clock position of the introitus. No erythema.  No abnormal discharge noted.   MUSCULOSKELETAL: Normal range of motion. No edema noted.   Assessment & Plan:  Redundant hymenal ring tissue Patient was reassured about this normal finding.  Routine preventative health maintenance measures emphasized. Please refer to After Visit Summary for other counseling recommendations.    Jaynie CollinsUGONNA  Alize Acy, MD, FACOG Attending Obstetrician & Gynecologist, [facility]

## 2015-06-12 ENCOUNTER — Other Ambulatory Visit: Payer: Self-pay | Admitting: Medical

## 2015-06-12 NOTE — Telephone Encounter (Signed)
[facility]  Angelique Blonderenise L Jeb LeveringSmith  Carah Barrientes S Manvi Guilliams, CMA  Downstairs Pharm need to know how often patient uses her Test strips in order to refill them (per Insurance).    Spoke with Pam downstairs and per last OV note with Esperanza RichtersEdward Saguier patient is to test twice daily before meals.

## 2015-06-15 MED FILL — BENICAR HCT 40-12.5 MG TAB: 40-12.5 | 30 days supply | Qty: 30 | Fill #0

## 2015-06-22 ENCOUNTER — Ambulatory Visit: Payer: 59 | Admitting: Physical Therapy

## 2015-06-25 ENCOUNTER — Other Ambulatory Visit: Payer: Self-pay | Admitting: Medical

## 2015-06-26 ENCOUNTER — Telehealth: Payer: Self-pay | Admitting: *Deleted

## 2015-06-26 MED FILL — NIFEDICAL XL 60 MG TABLET: 60 | 30 days supply | Qty: 30 | Fill #0

## 2015-06-26 MED FILL — LEVEMIR 100 UNITS/ML VIAL: 100 | 31 days supply | Qty: 10 | Fill #0

## 2015-06-26 NOTE — Telephone Encounter (Signed)
PA initiated on [email or web address], awaiting determination. JG//CMA 

## 2015-06-27 ENCOUNTER — Encounter: Payer: Self-pay | Admitting: Gastroenterology

## 2015-06-27 MED FILL — JARDIANCE 10 MG TABLET: 10 | 30 days supply | Qty: 30 | Fill #0

## 2015-06-29 NOTE — Telephone Encounter (Signed)
Approved effective 06/26/15-06/08/38. Ref#: [id number]. Approval letter sent for scanning. JG//CMA

## 2015-07-16 ENCOUNTER — Telehealth: Payer: Self-pay | Admitting: Medical

## 2015-07-16 ENCOUNTER — Ambulatory Visit (INDEPENDENT_AMBULATORY_CARE_PROVIDER_SITE_OTHER): Payer: Self-pay | Admitting: Medical

## 2015-07-16 ENCOUNTER — Ambulatory Visit (HOSPITAL_BASED_OUTPATIENT_CLINIC_OR_DEPARTMENT_OTHER)
Admission: RE | Admit: 2015-07-16 | Discharge: 2015-07-16 | Disposition: A | Discharge status: Home / Self Care | Payer: BLUE CROSS/BLUE SHIELD | Source: Ambulatory Visit | Attending: Medical | Admitting: Medical

## 2015-07-16 ENCOUNTER — Encounter: Payer: Self-pay | Admitting: Medical

## 2015-07-16 VITALS — BP 126/78 | HR 77 | Temp 98.1°F | Ht 63.0 in | Wt 168.6 lb

## 2015-07-16 DIAGNOSIS — E119 Type 2 diabetes mellitus without complications: Secondary | ICD-10-CM

## 2015-07-16 DIAGNOSIS — R05 Cough: Secondary | ICD-10-CM

## 2015-07-16 DIAGNOSIS — J0100 Acute maxillary sinusitis, unspecified: Secondary | ICD-10-CM

## 2015-07-16 DIAGNOSIS — I739 Peripheral vascular disease, unspecified: Secondary | ICD-10-CM | POA: Diagnosis not present

## 2015-07-16 DIAGNOSIS — R059 Cough, unspecified: Secondary | ICD-10-CM

## 2015-07-16 DIAGNOSIS — E1165 Type 2 diabetes mellitus with hyperglycemia: Secondary | ICD-10-CM

## 2015-07-16 DIAGNOSIS — Z9889 Other specified postprocedural states: Secondary | ICD-10-CM | POA: Diagnosis not present

## 2015-07-16 DIAGNOSIS — M79674 Pain in right toe(s): Secondary | ICD-10-CM

## 2015-07-16 DIAGNOSIS — M25571 Pain in right ankle and joints of right foot: Secondary | ICD-10-CM

## 2015-07-16 LAB — COMPREHENSIVE METABOLIC PANEL
ALT: 24 U/L (ref 0–35)
AST: 21 U/L (ref 0–37)
Albumin: 4.2 g/dL (ref 3.5–5.2)
Alkaline Phosphatase: 95 U/L (ref 39–117)
BUN: 17 mg/dL (ref 6–23)
CALCIUM: 9.5 mg/dL (ref 8.4–10.5)
CHLORIDE: 103 meq/L (ref 96–112)
CO2: 31 meq/L (ref 19–32)
Creatinine, Ser: 0.8 mg/dL (ref 0.40–1.20)
GFR: 75.74 mL/min (ref >60.00)
GLUCOSE: 164 mg/dL — AB (ref 70–99)
POTASSIUM: 3.6 meq/L (ref 3.5–5.1)
Sodium: 141 mEq/L (ref 135–145)
Total Bilirubin: 0.4 mg/dL (ref 0.2–1.2)
Total Protein: 7.7 g/dL (ref 6.0–8.3)

## 2015-07-16 LAB — HEMOGLOBIN A1C: HEMOGLOBIN A1C: 8.7 % — AB (ref 4.6–6.5)

## 2015-07-16 MED ORDER — AMOXICILLIN-POT CLAVULANATE 875-125 MG PO TABS: 1.0000 | ORAL_TABLET | Freq: Two times a day (BID) | ORAL | Status: DC

## 2015-07-16 MED ORDER — BENZONATATE 100 MG PO CAPS: 100.0000 mg | ORAL_CAPSULE | Freq: Three times a day (TID) | ORAL | Status: DC | PRN

## 2015-07-16 MED ORDER — FLUTICASONE PROPIONATE 50 MCG/ACT NA SUSP: 2.0000 | Freq: Every day | NASAL | Status: DC

## 2015-07-16 MED FILL — BENZONATATE 100 MG CAPSULE: 100 | 7 days supply | Qty: 21 | Fill #0

## 2015-07-16 MED FILL — AMOX-CLAV 875-125 MG TABLET: 875-125 | 10 days supply | Qty: 20 | Fill #0

## 2015-07-16 MED FILL — FLUTICASONE PROP 50 MCG SPR: 50 | 30 days supply | Qty: 16 | Fill #0

## 2015-07-16 NOTE — Progress Notes (Signed)
Pre visit review using our clinic review tool, if applicable. No additional management support is needed unless otherwise documented below in the visit note. 

## 2015-07-16 NOTE — Telephone Encounter (Signed)
Referred pt back to Dr.Kumar.

## 2015-07-16 NOTE — Telephone Encounter (Signed)
Will you see referral and try to speak with daughter who speaks english when you notify on sports med referral.

## 2015-07-16 NOTE — Patient Instructions (Addendum)
Your appear to have a sinus infection. I am prescribing  augmentin antibiotic for the infection. To help with the nasal congestion I prescribed flonase nasal steroid. For your associated cough benzonatate, I prescribed cough medicine.  Rest, hydrate, tylenol for fever.  Please get a1-c and cmp today. Refer back to Dr. Lucianne Muss when those results in.  Rt ankle and foot xray. Will call you with results.  Regarding your chronic vision problems if you think this is worsening particularly rt side let me know and could try to expedite referral to specialist  Follow up in 3 wks or as needed(I want to update health maintenance on next visit but not while you are sick)
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
## 2015-07-18 ENCOUNTER — Ambulatory Visit: Payer: Self-pay | Admitting: Family Medicine

## 2015-07-18 NOTE — Telephone Encounter (Signed)
Daughter is aware of appt with Dr Pearletha Forge

## 2015-07-19 ENCOUNTER — Other Ambulatory Visit: Payer: Self-pay | Admitting: Physician Assistant

## 2015-07-19 ENCOUNTER — Ambulatory Visit (INDEPENDENT_AMBULATORY_CARE_PROVIDER_SITE_OTHER): Payer: BLUE CROSS/BLUE SHIELD | Admitting: Family Medicine

## 2015-07-19 ENCOUNTER — Encounter: Payer: Self-pay | Admitting: Family Medicine

## 2015-07-19 VITALS — BP 154/82 | HR 66 | Ht 62.0 in | Wt 162.0 lb

## 2015-07-19 DIAGNOSIS — S99921A Unspecified injury of right foot, initial encounter: Secondary | ICD-10-CM

## 2015-07-19 MED FILL — BENICAR HCT 40-12.5 MG TAB: 40-12.5 | 30 days supply | Qty: 30 | Fill #0

## 2015-07-19 MED FILL — PREDNISOLONE AC 1% EYE DROP: 1 | 25 days supply | Qty: 5 | Fill #0

## 2015-07-19 NOTE — Patient Instructions (Signed)
You have a great toe fracture. These take about 6 weeks to completely improve. Icing 15 minutes at a time 3-4 times a day. Elevate above your heart as needed for swelling. Tylenol and/or motrin as needed for pain. Consider buddy taping to next toe and wearing cam walker for comfort. Follow up with me in 5 weeks (when you are 6 weeks out). Ok to walk with this though.

## 2015-07-19 NOTE — Telephone Encounter (Signed)
Rx filled with enough medication until pt will be back for a follow up.

## 2015-07-23 ENCOUNTER — Other Ambulatory Visit: Payer: Self-pay | Admitting: *Deleted

## 2015-07-23 ENCOUNTER — Ambulatory Visit (INDEPENDENT_AMBULATORY_CARE_PROVIDER_SITE_OTHER): Payer: BLUE CROSS/BLUE SHIELD | Admitting: Endocrinology

## 2015-07-23 ENCOUNTER — Encounter: Payer: Self-pay | Admitting: Endocrinology

## 2015-07-23 VITALS — BP 130/78 | HR 69 | Temp 98.5°F | Resp 14 | Ht 62.0 in | Wt 171.2 lb

## 2015-07-23 DIAGNOSIS — Z794 Long term (current) use of insulin: Secondary | ICD-10-CM

## 2015-07-23 DIAGNOSIS — S99921A Unspecified injury of right foot, initial encounter: Secondary | ICD-10-CM | POA: Insufficient documentation

## 2015-07-23 DIAGNOSIS — E1165 Type 2 diabetes mellitus with hyperglycemia: Secondary | ICD-10-CM | POA: Diagnosis not present

## 2015-07-23 MED ORDER — INSULIN ASPART 100 UNIT/ML FLEXPEN: PEN_INJECTOR | SUBCUTANEOUS | Status: DC

## 2015-07-23 MED ORDER — ONETOUCH ULTRA BLUE VI STRP: ORAL_STRIP | Status: DC

## 2015-07-23 MED ORDER — EMPAGLIFLOZIN 10 MG PO TABS: 10.0000 mg | ORAL_TABLET | Freq: Every day | ORAL | Status: DC

## 2015-07-23 MED ORDER — INSULIN LISPRO 100 UNIT/ML (KWIKPEN): PEN_INJECTOR | SUBCUTANEOUS | Status: DC

## 2015-07-23 MED ORDER — INSULIN DETEMIR 100 UNIT/ML ~~LOC~~ SOLN: SUBCUTANEOUS | Status: DC

## 2015-07-23 MED FILL — LEVEMIR 100 UNITS/ML VIAL: 100 | 30 days supply | Qty: 10 | Fill #0

## 2015-07-23 MED FILL — NOVOLOG FLEXPEN SYRINGE: 100 | 75 days supply | Qty: 15 | Fill #0

## 2015-07-23 MED FILL — JARDIANCE 10 MG TABLET: 10 | 30 days supply | Qty: 30 | Fill #0

## 2015-07-23 NOTE — Patient Instructions (Addendum)
Check blood sugars on waking up 3-4  times a week Also check blood sugars about 2 hours after a meal and do this after different meals by rotation  Recommended blood sugar levels on waking up is 90-130 and about 2 hours after meal is 130-180  Please bring your blood sugar monitor to each visit, thank you  Humalog insulin just before eating, start with 6 units at bfst and lunch and 8 at dinner

## 2015-07-23 NOTE — Progress Notes (Signed)
Patient ID: Cynthia Mata, female   DOB: 12/12/1945, 70 y.o.   MRN: [id number]           Reason for Appointment: Follow-up for Type 2 Diabetes  Referring physician: Saguier  History of Present Illness:          Diagnosis: Type 2 diabetes mellitus, date of diagnosis: 2001        Past history: At the time of diagnosis she had symptoms of increased thirst and urination and very high blood sugars, was obese.   She was [place] with oral medications possibly glipizide and metformin She also changed her diet and was able to lose weight  However she does not think her blood sugars had been consistently controlled In 2012 she was given Levemir insulin but not clear if this helped her sugar control as her A1c was still 9.5 in 2014 She was apparently continued on Metaglip also which he had been taking for a while; she has usually taken only 1 tablet twice a day of the 5/500 tablets.  Has not had any abdominal pain or diarrhea with metformin previously  Recent history:   INSULIN regimen is described as:  Levemir 30 in am Regular 10 units 30 min ac tid  She has not been seen in follow-up for several months now On her last visit she was taking Levemir 35 units in the morning and regular insulin before each meal However has not taken any regular insulin for quite some time and does not know when. On her last visit she was started on Invokana also for helping her control and weight loss This was changed to [place] because of insurance preference  Current management, blood sugar patterns and problems:  Her fasting blood sugars are overall fairly well controlled but has sporadic high readings, somewhat better yesterday and today  She is sometimes checking her blood sugars midday and afternoon but not consistently  Blood sugars after supper and at bedtime are consistently very high  She has reduced her Levemir down to only 30 units and not clear why  She has  gained some weight recently  She thinks she is watching her diet better and reducing some of her carbohydrate intake after seeing the dietitian in 5/16  Has tried to walk periodically       Oral hypoglycemic drugs the patient is taking are: Metaglip bid     Side effects from medications have been: None  Compliance with the medical regimen: Fair Hypoglycemia:    Glucose monitoring:  done 2-3 times a day         Glucometer: One Touch.      Blood Glucose readings by time of day and averages   Mean values apply above for all meters except median for One Touch  PRE-MEAL Fasting Lunch Dinner Bedtime Overall  Glucose range:  104-242    132-240   193-355    Mean/median:  154     270   190+/-71     Self-care: The diet that the patient has been following is: tries to limit Sweets, carbs and fats and is eating more vegetables .     Meals: 3 meals per day. Breakfast is eggs, beans and/or bread  .   Has chicken and vegetables for lunch and dinner with some tortillas.  Has bread and nuts for snacks    Dinner is at 6 pm         Exercise:  walking 3 times a  week  10-15 min        Dietician visit, most recent: Never .               Weight history:  Wt Readings from Last 3 Encounters:  07/23/15 171 lb 3.2 oz (77.656 kg)  07/19/15 162 lb (73.483 kg)  07/16/15 168 lb 9.6 oz (76.476 kg)    Glycemic control:   Lab Results  Component Value Date   HGBA1C 8.7* 07/16/2015   HGBA1C 7.7* 10/23/2014   HGBA1C 9.4* 06/29/2014   Lab Results  Component Value Date   MICROALBUR 3.1* 10/23/2014   LDLCALC 75 08/12/2014   CREATININE 0.80 07/16/2015         Medication List       This list is accurate as of: 07/23/15  2:48 PM.  Always use your most recent med list.               amoxicillin-clavulanate 875-125 MG tablet  Commonly known as:  AUGMENTIN  Take 1 tablet by mouth 2 (two) times daily.     BENICAR HCT 40-12.5 MG tablet  Generic drug:  olmesartan-hydrochlorothiazide  TAKE 1  TABLET BY MOUTH DAILY     benzonatate 100 MG capsule  Commonly known as:  TESSALON  Take 1 capsule (100 mg total) by mouth 3 (three) times daily as needed.     empagliflozin 10 MG Tabs tablet  Commonly known as:  JARDIANCE  Take 10 mg by mouth daily.     fluticasone 50 MCG/ACT nasal spray  Commonly known as:  FLONASE  Place 2 sprays into both nostrils daily.     insulin aspart 100 UNIT/ML FlexPen  Commonly known as:  NOVOLOG FLEXPEN  Inject 6 units with breakfast and lunch and 8 units with supper     insulin detemir 100 UNIT/ML injection  Commonly known as:  LEVEMIR  INJECT 30 UNITS SUBCUTANEOUSLY AT BEDTIME     insulin lispro 100 UNIT/ML KiwkPen  Commonly known as:  HUMALOG KWIKPEN  6-8 units before meals     NIFEdipine 60 MG 24 hr tablet  Commonly known as:  PROCARDIA-XL/ADALAT CC  Take 1 tablet (60 mg total) by mouth daily.     NIFEDICAL XL 60 MG 24 hr tablet  Generic drug:  NIFEdipine  TAKE 1 TABLET BY MOUTH DAILY     ONE TOUCH ULTRA TEST test strip  Generic drug:  glucose blood  Use to check blood sugar 3 times per day dx code E11.9     prednisoLONE acetate 1 % ophthalmic suspension  Commonly known as:  PRED FORTE        Allergies: No Known Allergies  Past Medical History  Diagnosis Date  . Hypertension   . Diabetes mellitus without complication (HCC)   . Macular degeneration   . Anemia   . Cataract   . Hyperlipidemia   . Osteoporosis   . Thyroid disease   . Trapezius strain 10/23/2014    Past Surgical History  Procedure Laterality Date  . Ankle fracture surgery      Right ankle  . Tonsillectomy      Abcess removed also  . Cataracts  05-2015    Family History  Problem Relation Age of Onset  . Hypertension Maternal Aunt   . Heart disease Maternal Aunt   . Diabetes Neg Hx   . COPD Neg Hx   . CAD Maternal Grandfather     Social History:  reports that she has never smoked. She does  not have any smokeless tobacco history on file. She reports  that she does not drink alcohol or use illicit drugs.    Review of Systems    Most recent eye exam was ?  3/16         Lipids: On Rx since 7/15 and LDL controlled with Lipitor       Lab Results  Component Value Date   CHOL 146 08/12/2014   HDL 35* 08/12/2014   LDLCALC 75 08/12/2014   TRIG 179* 08/12/2014   CHOLHDL 4.2 08/12/2014                  Thyroid: Currently not on any supplements and TSH is mildly increased, no fatigue  Lab Results  Component Value Date   TSH 4.753* 08/11/2014   TSH 3.74 06/29/2014       The blood pressure has been high for 15 years treated with multiple medications, complaining about the cost of Benicar HCT and also on Procardia   LABS:  No visits with results within 1 Week(s) from this visit. Latest known visit with results is:  Office Visit on 07/16/2015  Component Date Value Ref Range Status  . Hgb A1c MFr Bld 07/16/2015 8.7* 4.6 - 6.5 % Final   Glycemic Control Guidelines for People with Diabetes:Non Diabetic:  <6%Goal of Therapy: <7%Additional Action Suggested:  >8%   . Sodium 07/16/2015 141  135 - 145 mEq/L Final  . Potassium 07/16/2015 3.6  3.5 - 5.1 mEq/L Final  . Chloride 07/16/2015 103  96 - 112 mEq/L Final  . CO2 07/16/2015 31  19 - 32 mEq/L Final  . Glucose, Bld 07/16/2015 164* 70 - 99 mg/dL Final  . BUN 40/98/1191 17  6 - 23 mg/dL Final  . Creatinine, Ser 07/16/2015 0.80  0.40 - 1.20 mg/dL Final  . Total Bilirubin 07/16/2015 0.4  0.2 - 1.2 mg/dL Final  . Alkaline Phosphatase 07/16/2015 95  39 - 117 U/L Final  . AST 07/16/2015 21  0 - 37 U/L Final  . ALT 07/16/2015 24  0 - 35 U/L Final  . Total Protein 07/16/2015 7.7  6.0 - 8.3 g/dL Final  . Albumin 47/82/9562 4.2  3.5 - 5.2 g/dL Final  . Calcium 13/01/6577 9.5  8.4 - 10.5 mg/dL Final  . GFR 46/96/2952 75.74  >60.00 mL/min Final    Physical Examination:  BP 130/78 mmHg  Pulse 69  Temp(Src) 98.5 F (36.9 C)  Resp 14  Ht  (1.575 m)  Wt 171 lb 3.2 oz (77.656 kg)   BMI 31.31 kg/m2  SpO2 96%  No pedal edema present    ASSESSMENT/PLAN:  Diabetes type 2, uncontrolled with obesity and BMI 34  See history of present illness for current blood sugar patterns, problems identified and current management She has not been seen in follow-up regularly she is currently only on basal insulin and Jardiance 10 mg  Although she has been able to lose some weight and improve her diet her A1c is now 8.7  This is due to her stopping the mealtime insulin that had been started previously and she does not understand the need for covering meals with rapid acting insulin. Her daughter was present today and will help her and is her diabetes better  She appears to be having mostly good fasting readings recently with taking the Lantus in the mornings High readings in the mornings may also be due to hyperglycemia overnight and higher fat meals at times  She was  instructed on the timing and use of mealtime insulin, will start withHUMALOG 6 units at breakfast or lunch and 8 at dinner This may need to be increased based on postprandial readings She will check coverage for this also Discussed that she may need to sometimes adjust the dose based on her meal size and carbohydrate intake For now will continue Lantus unchanged but consider switching to [place] or [place] if covered by insurance for better consistency Consider increasing dose of Jardiance  ?  Hypothyroidism: Will check her TSH next time    Patient Instructions  Check blood sugars on waking up 3-4  times a week Also check blood sugars about 2 hours after a meal and do this after different meals by rotation  Recommended blood sugar levels on waking up is 90-130 and about 2 hours after meal is 130-180  Please bring your blood sugar monitor to each visit, thank you  Humalog insulin just before eating, start with 6 units at bfst and lunch and 8 at dinner       Counseling time on subjects discussed above is over  50% of today's 25 minute visit   Afsa Meany 07/23/2015, 2:48 PM   Note: This office note was prepared with Dragon voice recognition system technology. Any transcriptional errors that result from this process are unintentional.

## 2015-07-23 NOTE — Progress Notes (Signed)
PCP and consultation requested by: Esperanza Richters, PA-C  Subjective:   HPI: Patient is a 70 y.o. female here for right toe injury.  Patient reports a month ago a water bottle struck her foot and hit great toe. Then on 2/1 she was getting in the car and had great toe get stuck under something, turned and felt sharp pain dorsal right great toe distally. + swelling and bruising. Difficulty walking. Pain level is 6/10, sharp. No fever, rash, other complaints.  Past Medical History  Diagnosis Date  . Hypertension   . Diabetes mellitus without complication (HCC)   . Macular degeneration   . Anemia   . Cataract   . Hyperlipidemia   . Osteoporosis   . Thyroid disease   . Trapezius strain 10/23/2014    Current Outpatient Prescriptions on File Prior to Visit  Medication Sig Dispense Refill  . amoxicillin-clavulanate (AUGMENTIN) 875-125 MG tablet Take 1 tablet by mouth 2 (two) times daily. 20 tablet 0  . benzonatate (TESSALON) 100 MG capsule Take 1 capsule (100 mg total) by mouth 3 (three) times daily as needed. 21 capsule 0  . fluticasone (FLONASE) 50 MCG/ACT nasal spray Place 2 sprays into both nostrils daily. 16 g 1  . NIFEDICAL XL 60 MG 24 hr tablet TAKE 1 TABLET BY MOUTH DAILY 30 tablet 0  . NIFEdipine (PROCARDIA-XL/ADALAT CC) 60 MG 24 hr tablet Take 1 tablet (60 mg total) by mouth daily. (Patient not taking: Reported on 07/23/2015) 30 tablet 2   No current facility-administered medications on file prior to visit.    Past Surgical History  Procedure Laterality Date  . Ankle fracture surgery      Right ankle  . Tonsillectomy      Abcess removed also  . Cataracts  05-2015    No Known Allergies  Social History   Social History  . Marital Status: Married    Spouse Name: N/A  . Number of Children: 5  . Years of Education: N/A   Occupational History  . Not on file.   Social History Main Topics  . Smoking status: Never Smoker   . Smokeless tobacco: Not on file  .  Alcohol Use: No  . Drug Use: No  . Sexual Activity: Not Currently   Other Topics Concern  . Not on file   Social History Narrative   Lives with daughter.      Family History  Problem Relation Age of Onset  . Hypertension Maternal Aunt   . Heart disease Maternal Aunt   . Diabetes Neg Hx   . COPD Neg Hx   . CAD Maternal Grandfather     BP 154/82 mmHg  Pulse 66  Ht  (1.575 m)  Wt 162 lb (73.483 kg)  BMI 29.62 kg/m2  Review of Systems: See HPI above.    Objective:  Physical Exam:  Gen: NAD  Right foot/ankle: Mild swelling of great toe - associated bruising as well in two different areas plantar great toe.  No other swelling, deformity. FROM ankle.  Able to flex and extend great toe. TTP throughout great toe - most distal phalanx however. Negative ant drawer and talar tilt.   Negative syndesmotic compression. Negative metatarsal squeeze Thompsons test negative. NV intact distally.  Left foot/ankle: FROM without pain.    Assessment & Plan:  1. Right great toe injury - independently reviewed radiographs and she has a distal phalanx fracture that is intraarticular.  No malrotation or angulation on exam.  We will try  to treat this conservatively.  Icing, tylenol/nsaids.  Cam walker with buddy taping for support.  F/u in 5 weeks when she is 6 weeks out or sooner if she has any problems.  Ok to ambulate with this.

## 2015-07-23 NOTE — Assessment & Plan Note (Signed)
Right great toe injury - independently reviewed radiographs and she has a distal phalanx fracture that is intraarticular.  No malrotation or angulation on exam.  We will try to treat this conservatively.  Icing, tylenol/nsaids.  Cam walker with buddy taping for support.  F/u in 5 weeks when she is 6 weeks out or sooner if she has any problems.  Ok to ambulate with this.

## 2015-07-25 ENCOUNTER — Other Ambulatory Visit: Payer: Self-pay | Admitting: *Deleted

## 2015-08-10 ENCOUNTER — Other Ambulatory Visit: Payer: Self-pay | Admitting: Medical

## 2015-08-10 MED FILL — NIFEDICAL XL 60 MG TABLET: 60 | 30 days supply | Qty: 30 | Fill #0

## 2015-08-16 ENCOUNTER — Encounter: Payer: Self-pay | Admitting: Gastroenterology

## 2015-08-20 ENCOUNTER — Ambulatory Visit: Payer: BLUE CROSS/BLUE SHIELD | Admitting: Endocrinology

## 2015-08-23 ENCOUNTER — Encounter: Payer: Self-pay | Admitting: Family Medicine

## 2015-08-23 ENCOUNTER — Ambulatory Visit (HOSPITAL_BASED_OUTPATIENT_CLINIC_OR_DEPARTMENT_OTHER)
Admission: RE | Admit: 2015-08-23 | Discharge: 2015-08-23 | Disposition: A | Discharge status: Home / Self Care | Payer: BLUE CROSS/BLUE SHIELD | Source: Ambulatory Visit | Attending: Family Medicine | Admitting: Family Medicine

## 2015-08-23 ENCOUNTER — Ambulatory Visit (INDEPENDENT_AMBULATORY_CARE_PROVIDER_SITE_OTHER): Payer: BLUE CROSS/BLUE SHIELD | Admitting: Family Medicine

## 2015-08-23 VITALS — BP 161/81 | HR 68 | Ht 62.0 in | Wt 170.0 lb

## 2015-08-23 DIAGNOSIS — S92411D Displaced fracture of proximal phalanx of right great toe, subsequent encounter for fracture with routine healing: Secondary | ICD-10-CM | POA: Diagnosis not present

## 2015-08-23 DIAGNOSIS — X58XXXD Exposure to other specified factors, subsequent encounter: Secondary | ICD-10-CM | POA: Diagnosis not present

## 2015-08-23 DIAGNOSIS — S99921D Unspecified injury of right foot, subsequent encounter: Secondary | ICD-10-CM | POA: Diagnosis present

## 2015-08-25 ENCOUNTER — Encounter: Payer: Self-pay | Admitting: Emergency Medicine

## 2015-08-25 ENCOUNTER — Emergency Department
Admission: EM | Admit: 2015-08-25 | Discharge: 2015-08-25 | Disposition: A | Discharge status: Home / Self Care | Payer: BLUE CROSS/BLUE SHIELD | Source: Home / Self Care | Attending: Family Medicine | Admitting: Family Medicine

## 2015-08-25 DIAGNOSIS — H8110 Benign paroxysmal vertigo, unspecified ear: Secondary | ICD-10-CM | POA: Diagnosis not present

## 2015-08-25 DIAGNOSIS — H6121 Impacted cerumen, right ear: Secondary | ICD-10-CM

## 2015-08-25 DIAGNOSIS — K5900 Constipation, unspecified: Secondary | ICD-10-CM | POA: Diagnosis not present

## 2015-08-25 MED ORDER — AMOXICILLIN 875 MG PO TABS: 875.0000 mg | ORAL_TABLET | Freq: Two times a day (BID) | ORAL | Status: DC

## 2015-08-25 MED ORDER — POLYETHYLENE GLYCOL 3350 17 GM/SCOOP PO POWD: 17.0000 g | Freq: Every day | ORAL | Status: DC

## 2015-08-25 MED ORDER — MECLIZINE HCL 25 MG PO TABS: 25.0000 mg | ORAL_TABLET | Freq: Three times a day (TID) | ORAL | Status: DC | PRN

## 2015-08-25 MED ORDER — CARBAMIDE PEROXIDE 6.5 % OT SOLN: 5.0000 [drp] | Freq: Two times a day (BID) | OTIC | Status: DC

## 2015-08-25 NOTE — ED Notes (Signed)
Pt c/o dizziness that started suddenly last week. States only with movement.

## 2015-08-25 NOTE — Discharge Instructions (Signed)
Followup with family doctor for evaluation of constipation.   Vrtigo posicional benigno (Benign Positional Vertigo) El vrtigo es la sensacin de que usted o todo lo que lo rodea se mueven cuando en realidad eso no sucede. El vrtigo posicional benigno es el tipo de vrtigo ms comn. La causa de este trastorno no es grave (es benigna). Algunos movimientos y determinadas posiciones pueden desencadenar el trastorno (es posicional). El vrtigo posicional benigno puede ser peligroso si ocurre mientras est haciendo algo que podra suponer un riesgo para usted y para los dems, por ejemplo, conduciendo un automvil.  CAUSAS En muchos de los [place], se desconoce la causa de este trastorno. Puede deberse a Hotel manageruna alteracin en una zona del odo interno que ayuda al cerebro a percibir el movimiento y a Administrator, Civil Servicecoordinar el equilibrio. Esta alteracin puede deberse a una infeccin viral (laberintitis), a una lesin en la cabeza o a los movimientos reiterados. FACTORES DE RIESGO Es ms probable que esta afeccin se manifieste en:  Las mujeres.  Las Smith Internationalpersonas mayores de [id number]. SNTOMAS Generalmente, los sntomas de este trastorno se presentan al mover la cabeza o los ojos en diferentes direcciones. Pueden aparecer repentinamente y suelen durar menos de un minuto. Entre los sntomas se pueden incluir los siguientes:  Prdida del equilibrio y cadas.  Sensacin de estar dando vueltas o movindose.  Sensacin de que el entorno est dando vueltas o movindose.  Nuseas y vmitos.  Visin borrosa.  Mareos.  Movimientos oculares involuntarios (nistagmo). Los sntomas pueden ser leves y algo fastidiosos, o pueden ser graves e interferir en la vida cotidiana. Los episodios de vrtigo posicional benigno pueden repetirse (ser recurrentes) a lo largo del Sudden Valleytiempo, y algunos movimientos pueden desencadenarlos. Los sntomas pueden mejorar con Museum/gallery conservatorel tiempo. DIAGNSTICO Generalmente, este trastorno se diagnostica con una  historia clnica y un examen fsico de la cabeza, el cuello y los odos. Tal vez lo deriven a Catering managerun mdico especialista en problemas de la garganta, la nariz y el odo (otorrinolaringlogo), o a uno que se especializa en trastornos del sistema nervioso (neurlogo). Pueden hacerle otros estudios, entre ellos:  Health visitoresonancia magntica.  Tomografa computarizada.  Estudios de los Ecolabmovimientos oculares. El mdico puede pedirle que cambie rpidamente de posicin mientras observa si se presentan sntomas de vrtigo posicional benigno, por ejemplo, nistagmo. Los movimientos oculares se pueden estudiar con una electronistagmografa (ENG), con estimulacin trmica, mediante la maniobra de [place] o con la prueba de rotacin.  Electroencefalograma (EEG). Este estudio registra la actividad elctrica del cerebro.  Pruebas de audicin. Lissa MoralesRATAMIENTO Generalmente, para tratar este trastorno, el mdico le har movimientos especficos con la cabeza para que el odo interno se normalice. Mohawk IndustriesCuando los casos son graves, tal vez haya que realizar una ciruga, pero esto no es frecuente. En algunos casos, el vrtigo posicional benigno se resuelve por s solo en el trmino de 2 o 4semanas. INSTRUCCIONES PARA EL CUIDADO EN EL HOGAR Seguridad  Muvase lentamente.No haga movimientos bruscos con el cuerpo o con la cabeza.  No conduzca.  No opere maquinaria pesada.  No haga ninguna tarea que podra ser peligrosa para usted o para Economistotras personas en caso de que ocurriera un episodio de vrtigo.  Si tiene dificultad para caminar o mantener el equilibrio, use un bastn para Photographermantener la estabilidad. Si se siente mareado o inestable, sintese de inmediato.  Reanude sus actividades normales como se lo haya indicado el mdico. Pregntele al mdico qu actividades son seguras para usted. Instrucciones generales  Baxter Internationalome los medicamentos de Curtisventa libre y  los recetados solamente como se lo haya indicado el mdico.  Evite algunas  posiciones o determinados movimientos como se lo haya indicado el mdico.  Beba suficiente lquido para Pharmacologist la orina clara o de color amarillo plido.  Concurra a todas las visitas de control como se lo haya indicado el mdico. Esto es importante. SOLICITE ATENCIN MDICA SI:  Lance Muss.  El trastorno Herreid, o le aparecen sntomas nuevos.  Sus familiares o amigos advierten cambios en su comportamiento.  Las nuseas o los vmitos empeoran.  Tiene sensacin de hormigueo o de adormecimiento. SOLICITE ATENCIN MDICA DE INMEDIATO SI:  Tiene dificultad para hablar o para moverse.  Esta mareado todo [employer].  Se desmaya.  Tiene dolores de cabeza intensos.  Tiene debilidad en los brazos o las piernas.  Tiene cambios en la audicin o la visin.  Siente rigidez en el cuello.  Tiene sensibilidad a Statistician.   Esta informacin no tiene Theme park manager el consejo del mdico. Asegrese de hacerle al mdico cualquier pregunta que tenga.   Document Released: 09/11/2008 Document Revised: 02/14/2015 Elsevier Interactive Patient Education 2016 [employer].   Estreimiento - Adultos (Constipation, Adult) Estreimiento significa que una persona tiene menos de tres evacuaciones en una semana, dificultad para defecar, o que las heces son secas, duras, o ms grandes que lo normal. A medida que envejecemos el estreimiento es ms comn. Una dieta baja en fibra, no tomar suficientes lquidos y el uso de ciertos medicamentos pueden Scientist, research (life sciences).  CAUSAS   Ciertos medicamentos, como los antidepresivos, analgsicos, suplementos de hierro, anticidos y diurticos.  Algunas enfermedades, como la diabetes, el sndrome del colon irritable, enfermedad de la tiroides, o depresin.  No beber suficiente agua.  No consumir suficientes alimentos ricos en fibra.  Situaciones de estrs o viajes.  Falta de actividad fsica o de ejercicio.  Ignorar la necesidad sbita de  Advertising copywriter.  Uso en exceso de laxantes. SIGNOS Y SNTOMAS   Defecar menos de tres veces por semana.  Dificultad para defecar.  Tener las heces secas y duras, o ms grandes que las normales.  Sensacin de estar lleno o hinchado.  Dolor en la parte baja del abdomen.  No sentir alivio despus de defecar. DIAGNSTICO  El mdico le har una historia clnica y un examen fsico. Pueden hacerle exmenes adicionales para el estreimiento grave. Estos estudios pueden ser:  Un radiografa con enema de bario para examinar el recto, el colon y, en algunos casos, el intestino delgado.  Una sigmoidoscopia para examinar el colon inferior.  Una colonoscopia para examinar todo el colon. TRATAMIENTO  El tratamiento depender de la gravedad del estreimiento y de la causa. Algunos tratamientos nutricionales son beber ms lquidos y comer ms alimentos ricos en fibra. El cambio en el estilo de vida incluye hacer ejercicios de [place] regular. Si estas recomendaciones para Public relations account executive dieta y en el estilo de vida no ayudan, el mdico le puede indicar el uso de laxantes de venta libre para ayudarlo a Advertising copywriter. Los medicamentos recetados se pueden prescribir si los medicamentos de venta libre no lo [place].  INSTRUCCIONES PARA EL CUIDADO EN EL HOGAR   Consuma alimentos con alto contenido de [place], como frutas, vegetales, cereales integrales y porotos.  Limite los alimentos procesados ricos en grasas y azcar, como las papas fritas, hamburguesas, galletas, dulces y refrescos.  Puede agregar un suplemento de fibra a su dieta si no obtiene lo suficiente de los alimentos.  Beba suficiente lquido para [employer]  la orina clara o de color amarillo plido.  Haga ejercicio regularmente o segn las indicaciones del mdico.  Vaya al bao cuando sienta la necesidad de ir. No se aguante las ganas.  Tome solo medicamentos de venta libre o recetados, segn las indicaciones del mdico. No tome otros  medicamentos para el estreimiento sin consultarlo antes con su mdico. SOLICITE ATENCIN MDICA DE INMEDIATO SI:   Observa sangre brillante en las heces.  El estreimiento dura ms de 4 das o [place].  Siente dolor abdominal o rectal.  Las heces son delgadas como un lpiz.  Pierde peso de [place] inexplicable. ASEGRESE DE QUE:   Comprende estas instrucciones.  Controlar su afeccin.  Recibir ayuda de inmediato si no mejora o si empeora.   Esta informacin no tiene Theme park manager el consejo del mdico. Asegrese de hacerle al mdico cualquier pregunta que tenga.   Document Released: 06/15/2007 Document Revised: 06/16/2014 Elsevier Interactive Patient Education [employer]

## 2015-08-25 NOTE — ED Provider Notes (Signed)
CSN: [id number]     Arrival date & time 08/25/15  1101 History   First MD Initiated Contact with Patient 08/25/15 1157     Chief Complaint  Patient presents with  . Dizziness      HPI Comments: Patient complains of one week history of dizziness (Head "spinning") with movement.  No fevers, chills, and sweats.  No nausea/vomiting.  No URI symptoms. She also complains of constipation and abdominal discomfort and requests medication for constipation.  She states that she has a history of hypothyroid.  The history is provided by the patient.    Past Medical History  Diagnosis Date  . Hypertension   . Diabetes mellitus without complication (HCC)   . Macular degeneration   . Anemia   . Cataract   . Hyperlipidemia   . Osteoporosis   . Thyroid disease   . Trapezius strain 10/23/2014   Past Surgical History  Procedure Laterality Date  . Ankle fracture surgery      Right ankle  . Tonsillectomy      Abcess removed also  . Cataracts  05-2015   Family History  Problem Relation Age of Onset  . Hypertension Maternal Aunt   . Heart disease Maternal Aunt   . Diabetes Neg Hx   . COPD Neg Hx   . CAD Maternal Grandfather    Social History  Substance Use Topics  . Smoking status: Never Smoker   . Smokeless tobacco: None  . Alcohol Use: No   OB History    No data available     Review of Systems No sore throat No cough No pleuritic pain No wheezing + nasal congestion ? post-nasal drainage No sinus pain/pressure No itchy/red eyes ? Earache + dizziness No hemoptysis No SOB No fever/chills No nausea No vomiting + abdominal pain No diarrhea + constipation No urinary symptoms No skin rash + fatigue No myalgias + headache Used OTC meds without relief  Allergies  Review of patient's allergies indicates no known allergies.  Home Medications   Prior to Admission medications   Medication Sig Start Date End Date Taking? Authorizing Provider  amoxicillin (AMOXIL) 875 MG  tablet Take 1 tablet (875 mg total) by mouth 2 (two) times daily. 08/25/15   Lattie Haw, MD  amoxicillin-clavulanate (AUGMENTIN) 875-125 MG tablet Take 1 tablet by mouth 2 (two) times daily. 07/16/15   Esperanza Richters, PA-C  BENICAR HCT 40-12.5 MG tablet TAKE 1 TABLET BY MOUTH DAILY 07/19/15   Esperanza Richters, PA-C  benzonatate (TESSALON) 100 MG capsule Take 1 capsule (100 mg total) by mouth 3 (three) times daily as needed. 07/16/15   Ramon Dredge Saguier, PA-C  carbamide peroxide (DEBROX) 6.5 % otic solution Place 5 drops into the right ear 2 (two) times daily. May use for 4 days 08/25/15   Lattie Haw, MD  empagliflozin (JARDIANCE) 10 MG TABS tablet Take 10 mg by mouth daily. 07/23/15   Reather Littler, MD  fluticasone (FLONASE) 50 MCG/ACT nasal spray Place 2 sprays into both nostrils daily. 07/16/15   Ramon Dredge Saguier, PA-C  insulin aspart (NOVOLOG FLEXPEN) 100 UNIT/ML FlexPen Inject 6 units with breakfast and lunch and 8 units with supper 07/23/15   Reather Littler, MD  insulin detemir (LEVEMIR) 100 UNIT/ML injection INJECT 30 UNITS SUBCUTANEOUSLY AT BEDTIME 07/23/15   Reather Littler, MD  insulin lispro (HUMALOG KWIKPEN) 100 UNIT/ML KiwkPen 6-8 units before meals 07/23/15   Reather Littler, MD  meclizine (ANTIVERT) 25 MG tablet Take 1 tablet (25 mg total) by  mouth 3 (three) times daily as needed for dizziness. 08/25/15   Lattie HawStephen A Beese, MD  NIFEDICAL XL 60 MG 24 hr tablet TAKE 1 TABLET BY MOUTH DAILY 08/10/15   Esperanza RichtersEdward Saguier, PA-C  NIFEdipine (PROCARDIA-XL/ADALAT CC) 60 MG 24 hr tablet Take 1 tablet (60 mg total) by mouth daily. Patient not taking: Reported on 07/23/2015 02/28/15   Esperanza RichtersEdward Saguier, PA-C  ONE TOUCH ULTRA TEST test strip Use to check blood sugar 3 times per day dx code E11.9 07/23/15   Reather LittlerAjay Kumar, MD  polyethylene glycol powder (MIRALAX) powder Take 17 g by mouth daily. Mix in 4 - 8 oz of liquid. 08/25/15   Lattie HawStephen A Beese, MD  prednisoLONE acetate (PRED FORTE) 1 % ophthalmic suspension  07/19/15   Historical Provider, MD    Meds Ordered and Administered this Visit  Medications - No data to display  BP 158/83 mmHg  Pulse 70  Temp(Src) 97.8 F (36.6 C) (Oral)  Wt 171 lb (77.565 kg)  SpO2 100% No data found.   Physical Exam Nursing notes and Vital Signs reviewed. Appearance:  Patient appears stated age, and in no acute distress Eyes:  Pupils are equal, round, and reactive to light and accomodation.  Extraocular movement is intact.  Conjunctivae are not inflamed.  Fundi benign.  No nystagmus.  Ears:   Right canal occluded with cerumen; unable to completely remove (unable to visualize right tympanic membrane).  Left canal and tympanic membrane normal. Nose:  Mildly congested turbinates.  No sinus tenderness.  Pharynx:  Normal Neck:  Supple.  Tender enlarged posterior nodes are palpated bilaterally  Lungs:  Clear to auscultation.  Breath sounds are equal.  Moving air well. Heart:  Regular rate and rhythm without murmurs, rubs, or gallops.  Abdomen:  Nontender without masses or hepatosplenomegaly.  Bowel sounds are present.  No CVA or flank tenderness.  Extremities:  No edema.  Skin:  No rash present.  Neurologic:  Cranial nerves 2 through 12 are normal.  Patellar, achilles, and elbow reflexes are normal.  Cerebellar function is intact (finger-to-nose and rapid alternating hand movement).  Gait and station are normal.    ED Course  Procedures none     MDM   1. Benign paroxysmal positional vertigo, unspecified laterality   2. Cerumen impaction, right   3. Constipation, unspecified constipation type    Begin Debrox drops to right ear.  Followup with ENT in 3 to 4 days for cerumen removal. Will empirically begin amoxicillin for sinusitis (and possible right otitis media). Begin Antivert. Rx for Miralax.  Recommend follow-up with PCP for further evaluation (note past history of hypothyroid)      Lattie HawStephen A Beese, MD 08/29/15 [phone]

## 2015-08-27 MED FILL — BENICAR HCT 40-12.5 MG TAB: 40-12.5 | 30 days supply | Qty: 30 | Fill #1

## 2015-08-27 MED FILL — LEVEMIR 100 UNITS/ML VIAL: 100 | 30 days supply | Qty: 10 | Fill #1

## 2015-08-27 NOTE — Assessment & Plan Note (Signed)
independently reviewed radiographs today and excellent healing of distal phalanx fracture.  Reassured patient.  May have occasional pain but expect this to resolve over next few weeks.  Regular, supportive shoes.  Icing, tylenol/nsaids if needed.  F/u prn.

## 2015-08-27 NOTE — Progress Notes (Signed)
PCP and consultation requested by: [name], Edward, PA-C  Subjective:   HPI: Patient is a 70 y.o. female here for right toe injury.  2/9: Patient reports a month ago a water bottle struck her foot and hit great toe. Then on 2/1 she was getting in the car and had great toe get stuck under something, turned and felt sharp pain dorsal right great toe distally. + swelling and bruising. Difficulty walking. Pain level is 6/10, sharp. No fever, rash, other complaints.  3/16: Patient reports she has improved since last visit but still gets a sharp pain in great toe. Slight swelling. Pain currently 0/10. No skin changes, fever otherwise.  Past Medical History  Diagnosis Date  . Hypertension   . Diabetes mellitus without complication (HCC)   . Macular degeneration   . Anemia   . Cataract   . Hyperlipidemia   . Osteoporosis   . Thyroid disease   . Trapezius strain 10/23/2014    Current Outpatient Prescriptions on File Prior to Visit  Medication Sig Dispense Refill  . BENICAR HCT 40-12.5 MG tablet TAKE 1 TABLET BY MOUTH DAILY 30 tablet 1  . empagliflozin (JARDIANCE) 10 MG TABS tablet Take 10 mg by mouth daily. 30 tablet 3  . fluticasone (FLONASE) 50 MCG/ACT nasal spray Place 2 sprays into both nostrils daily. 16 g 1  . insulin aspart (NOVOLOG FLEXPEN) 100 UNIT/ML FlexPen Inject 6 units with breakfast and lunch and 8 units with supper 15 mL 2  . insulin detemir (LEVEMIR) 100 UNIT/ML injection INJECT 30 UNITS SUBCUTANEOUSLY AT BEDTIME 10 mL 3  . insulin lispro (HUMALOG KWIKPEN) 100 UNIT/ML KiwkPen 6-8 units before meals 15 mL 1  . NIFEDICAL XL 60 MG 24 hr tablet TAKE 1 TABLET BY MOUTH DAILY 30 tablet 0  . NIFEdipine (PROCARDIA-XL/ADALAT CC) 60 MG 24 hr tablet Take 1 tablet (60 mg total) by mouth daily. (Patient not taking: Reported on 07/23/2015) 30 tablet 2  . ONE TOUCH ULTRA TEST test strip Use to check blood sugar 3 times per day dx code E11.9 100 each 3  . prednisoLONE acetate (PRED  FORTE) 1 % ophthalmic suspension   0   No current facility-administered medications on file prior to visit.    Past Surgical History  Procedure Laterality Date  . Ankle fracture surgery      Right ankle  . Tonsillectomy      Abcess removed also  . Cataracts  05-2015    No Known Allergies  Social History   Social History  . Marital Status: Married    Spouse Name: N/A  . Number of Children: 5  . Years of Education: N/A   Occupational History  . Not on file.   Social History Main Topics  . Smoking status: Never Smoker   . Smokeless tobacco: Not on file  . Alcohol Use: No  . Drug Use: No  . Sexual Activity: Not Currently   Other Topics Concern  . Not on file   Social History Narrative   Lives with daughter.      Family History  Problem Relation Age of Onset  . Hypertension Maternal Aunt   . Heart disease Maternal Aunt   . Diabetes Neg Hx   . COPD Neg Hx   . CAD Maternal Grandfather     BP 161/81 mmHg  Pulse 68  Ht 5\' 2"  (1.575 m)  Wt 170 lb (77.111 kg)  BMI 31.09 kg/m2  Review of Systems: See HPI above.    Objective:  Physical Exam:  Gen: NAD  Right foot/ankle: Minimal swelling of great toe - no longer with bruising.  No other swelling, deformity.  No malrotation, angulation. FROM ankle.  Able to flex and extend great toe without pain. Minimal TTP distal phalanx great toe.  No other tenderness. Negative ant drawer and talar tilt.   Negative metatarsal squeeze Thompsons test negative. NV intact distally.  Left foot/ankle: FROM without pain.    Assessment & Plan:  1. Right great toe injury - independently reviewed radiographs today and excellent healing of distal phalanx fracture.  Reassured patient.  May have occasional pain but expect this to resolve over next few weeks.  Regular, supportive shoes.  Icing, tylenol/nsaids if needed.  F/u prn.

## 2015-09-12 ENCOUNTER — Emergency Department (HOSPITAL_BASED_OUTPATIENT_CLINIC_OR_DEPARTMENT_OTHER)
Admission: EM | Admit: 2015-09-12 | Discharge: 2015-09-12 | Disposition: A | Discharge status: Home / Self Care | Payer: BLUE CROSS/BLUE SHIELD | Attending: Emergency Medicine | Admitting: Emergency Medicine

## 2015-09-12 ENCOUNTER — Ambulatory Visit (INDEPENDENT_AMBULATORY_CARE_PROVIDER_SITE_OTHER): Payer: BLUE CROSS/BLUE SHIELD | Admitting: Physician Assistant

## 2015-09-12 ENCOUNTER — Encounter (HOSPITAL_BASED_OUTPATIENT_CLINIC_OR_DEPARTMENT_OTHER): Payer: Self-pay

## 2015-09-12 ENCOUNTER — Encounter: Payer: Self-pay | Admitting: Physician Assistant

## 2015-09-12 VITALS — BP 158/70 | HR 77 | Temp 97.8°F | Ht 62.0 in | Wt 172.8 lb

## 2015-09-12 DIAGNOSIS — Z9889 Other specified postprocedural states: Secondary | ICD-10-CM | POA: Diagnosis not present

## 2015-09-12 DIAGNOSIS — H53141 Visual discomfort, right eye: Secondary | ICD-10-CM | POA: Insufficient documentation

## 2015-09-12 DIAGNOSIS — E119 Type 2 diabetes mellitus without complications: Secondary | ICD-10-CM | POA: Diagnosis not present

## 2015-09-12 DIAGNOSIS — Z862 Personal history of diseases of the blood and blood-forming organs and certain disorders involving the immune mechanism: Secondary | ICD-10-CM | POA: Diagnosis not present

## 2015-09-12 DIAGNOSIS — Z8739 Personal history of other diseases of the musculoskeletal system and connective tissue: Secondary | ICD-10-CM | POA: Insufficient documentation

## 2015-09-12 DIAGNOSIS — Z79899 Other long term (current) drug therapy: Secondary | ICD-10-CM | POA: Insufficient documentation

## 2015-09-12 DIAGNOSIS — J0101 Acute recurrent maxillary sinusitis: Secondary | ICD-10-CM | POA: Diagnosis not present

## 2015-09-12 DIAGNOSIS — Z794 Long term (current) use of insulin: Secondary | ICD-10-CM | POA: Insufficient documentation

## 2015-09-12 DIAGNOSIS — I10 Essential (primary) hypertension: Secondary | ICD-10-CM | POA: Diagnosis not present

## 2015-09-12 DIAGNOSIS — Z87828 Personal history of other (healed) physical injury and trauma: Secondary | ICD-10-CM | POA: Insufficient documentation

## 2015-09-12 DIAGNOSIS — H578 Other specified disorders of eye and adnexa: Secondary | ICD-10-CM | POA: Diagnosis not present

## 2015-09-12 DIAGNOSIS — H6121 Impacted cerumen, right ear: Secondary | ICD-10-CM | POA: Diagnosis not present

## 2015-09-12 DIAGNOSIS — H5711 Ocular pain, right eye: Secondary | ICD-10-CM

## 2015-09-12 DIAGNOSIS — H6120 Impacted cerumen, unspecified ear: Secondary | ICD-10-CM | POA: Insufficient documentation

## 2015-09-12 MED ORDER — HYDROCODONE-ACETAMINOPHEN 5-325 MG PO TABS
1.0000 | ORAL_TABLET | Freq: Once | ORAL | Status: AC
  Administered 2015-09-12: 1 via ORAL
  Filled 2015-09-12: qty 1

## 2015-09-12 MED ORDER — TETRACAINE HCL 0.5 % OP SOLN
2.0000 [drp] | Freq: Once | OPHTHALMIC | Status: AC
  Administered 2015-09-12: 2 [drp] via OPHTHALMIC
  Filled 2015-09-12: qty 4

## 2015-09-12 MED ORDER — FLUTICASONE PROPIONATE 50 MCG/ACT NA SUSP: 2.0000 | Freq: Every day | NASAL | Status: DC

## 2015-09-12 MED ORDER — AMOXICILLIN-POT CLAVULANATE 875-125 MG PO TABS: 1.0000 | ORAL_TABLET | Freq: Two times a day (BID) | ORAL | Status: DC

## 2015-09-12 MED ORDER — HYDROCODONE-ACETAMINOPHEN 5-325 MG PO TABS: 2.0000 | ORAL_TABLET | ORAL | Status: DC | PRN

## 2015-09-12 MED ORDER — FLUORESCEIN SODIUM 1 MG OP STRP
1.0000 | ORAL_STRIP | Freq: Once | OPHTHALMIC | Status: AC
  Administered 2015-09-12: 1 via OPHTHALMIC
  Filled 2015-09-12: qty 1

## 2015-09-12 MED ORDER — FLUORESCEIN SODIUM 1 MG OP STRP
ORAL_STRIP | OPHTHALMIC | Status: AC
  Filled 2015-09-12: qty 1

## 2015-09-12 MED FILL — AMOX-CLAV 875-125 MG TABLET: 875-125 | 10 days supply | Qty: 20 | Fill #0

## 2015-09-12 MED FILL — FLUTICASONE PROP 50 MCG SPR: 50 | 30 days supply | Qty: 16 | Fill #0

## 2015-09-12 MED FILL — JARDIANCE 10 MG TABLET: 10 | 30 days supply | Qty: 30 | Fill #1

## 2015-09-12 NOTE — Patient Instructions (Signed)
Please take the Augmentin as directed with food. Increase fluids. Restart your Flonase taking daily as directed to pull off fluid from ears. Start a daily Claritin.    Schedule follow-up with your Ophthalmologist since this has been an ongoing problem.

## 2015-09-12 NOTE — Discharge Instructions (Signed)
Eye pain  You were seen today for your eye pain.  Take the pain medication as needed for pain.  Your eye pain may be secondary something called iritis but this needs to be evaluated by an eye doctor.  The eye doctor on call said you should follow up with Dr. Alvino Chapelhoi tomorrow but if he cannot see you then you can be seen by Dr. Dione BoozeGroat whose information was provided.

## 2015-09-12 NOTE — Progress Notes (Signed)
 Patient presents to clinic today c/o continued vertigo with ear pressure, sinus pressure and pain, R ear pain and fatigue. Was seen at [facility] on 08/25/15 was started on Amoxicillin and Meclizine. Endorses taking medication as directed with initial resolution of symptoms that recurred after finishing antibiotics. Cerumen impaction noted but no irrigation or impaction removal performed at time of visit. Patient instructed to follow-up with ENT but states she has never seen an ENT.  Patient also endorses R eye redness and irritation over the past 2 days without drainage. Denies vision changes, drainage or crusting.   Past Medical History  Diagnosis Date  . Hypertension   . Diabetes mellitus without complication (HCC)   . Macular degeneration   . Anemia   . Cataract   . Hyperlipidemia   . Osteoporosis   . Thyroid disease   . Trapezius strain 10/23/2014    Current Outpatient Prescriptions on File Prior to Visit  Medication Sig Dispense Refill  . BENICAR HCT 40-12.5 MG tablet TAKE 1 TABLET BY MOUTH DAILY 30 tablet 1  . carbamide peroxide (DEBROX) 6.5 % otic solution Place 5 drops into the right ear 2 (two) times daily. May use for 4 days 15 mL 0  . empagliflozin (JARDIANCE) 10 MG TABS tablet Take 10 mg by mouth daily. 30 tablet 3  . insulin aspart (NOVOLOG FLEXPEN) 100 UNIT/ML FlexPen Inject 6 units with breakfast and lunch and 8 units with supper 15 mL 2  . insulin detemir (LEVEMIR) 100 UNIT/ML injection INJECT 30 UNITS SUBCUTANEOUSLY AT BEDTIME 10 mL 3  . insulin lispro (HUMALOG KWIKPEN) 100 UNIT/ML KiwkPen 6-8 units before meals 15 mL 1  . NIFEDICAL XL 60 MG 24 hr tablet TAKE 1 TABLET BY MOUTH DAILY 30 tablet 0  . NIFEdipine (PROCARDIA-XL/ADALAT CC) 60 MG 24 hr tablet Take 1 tablet (60 mg total) by mouth daily. 30 tablet 2  . ONE TOUCH ULTRA TEST test strip Use to check blood sugar 3 times per day dx code E11.9 100 each 3  . polyethylene glycol powder (MIRALAX) powder Take 17 g by mouth daily.  Mix in 4 - 8 oz of liquid. 255 g 0  . meclizine (ANTIVERT) 25 MG tablet Take 1 tablet (25 mg total) by mouth 3 (three) times daily as needed for dizziness. (Patient not taking: Reported on 09/12/2015) 15 tablet 1   No current facility-administered medications on file prior to visit.    No Known Allergies  Family History  Problem Relation Age of Onset  . Hypertension Maternal Aunt   . Heart disease Maternal Aunt   . Diabetes Neg Hx   . COPD Neg Hx   . CAD Maternal Grandfather     Social History   Social History  . Marital Status: Married    Spouse Name: N/A  . Number of Children: 5  . Years of Education: N/A   Social History Main Topics  . Smoking status: Never Smoker   . Smokeless tobacco: None  . Alcohol Use: No  . Drug Use: No  . Sexual Activity: Not Currently   Other Topics Concern  . None   Social History Narrative   Lives with daughter.     Review of Systems - See HPI.  All other ROS are negative.  BP 158/70 mmHg  Pulse 77  Temp(Src) 97.8 F (36.6 C) (Oral)  Ht 5' 2" (1.575 m)  Wt 172 lb 12.8 oz (78.382 kg)  BMI 31.60 kg/m2  SpO2 98%  Physical Exam  Constitutional:   She is oriented to person, place, and time and well-developed, well-nourished, and in no distress.  HENT:  Head: Normocephalic and atraumatic.  Right Ear: A middle ear effusion is present.  Left Ear: Tympanic membrane normal.  Nose: Right sinus exhibits maxillary sinus tenderness. Left sinus exhibits maxillary sinus tenderness.  Mouth/Throat: Uvula is midline, oropharynx is clear and moist and mucous membranes are normal.  Eyes: EOM are normal. Pupils are equal, round, and reactive to light. Right conjunctiva is injected. Right conjunctiva has no hemorrhage.  Neck: Neck supple.  Cardiovascular: Normal rate and normal heart sounds.   Pulmonary/Chest: Effort normal and breath sounds normal. No respiratory distress. She has no wheezes. She has no rales. She exhibits no tenderness.    Lymphadenopathy:    She has no cervical adenopathy.  Neurological: She is alert and oriented to person, place, and time.  Skin: Skin is warm and dry. No rash noted.  Psychiatric: Affect normal.  Vitals reviewed.   Recent Results (from the past 2160 hour(s))  HgB A1c     Status: Abnormal   Collection Time: 07/16/15 10:36 AM  Result Value Ref Range   Hgb A1c MFr Bld 8.7 (H) 4.6 - 6.5 %    Comment: Glycemic Control Guidelines for People with Diabetes:Non Diabetic:  <6%Goal of Therapy: <7%Additional Action Suggested:  >8%   Comp Met (CMET)     Status: Abnormal   Collection Time: 07/16/15 10:36 AM  Result Value Ref Range   Sodium 141 135 - 145 mEq/L   Potassium 3.6 3.5 - 5.1 mEq/L   Chloride 103 96 - 112 mEq/L   CO2 31 19 - 32 mEq/L   Glucose, Bld 164 (H) 70 - 99 mg/dL   BUN 17 6 - 23 mg/dL   Creatinine, Ser 0.80 0.40 - 1.20 mg/dL   Total Bilirubin 0.4 0.2 - 1.2 mg/dL   Alkaline Phosphatase 95 39 - 117 U/L   AST 21 0 - 37 U/L   ALT 24 0 - 35 U/L   Total Protein 7.7 6.0 - 8.3 g/dL   Albumin 4.2 3.5 - 5.2 g/dL   Calcium 9.5 8.4 - 10.5 mg/dL   GFR 75.74 >60.00 mL/min    Assessment/Plan: Cerumen impaction Removed via irrigation. Improvement in hearing and ear pressure. Supportive measures discussed.   Sinusitis, acute maxillary Improved with Amoxicillin. Suspect needed stronger ABX. Will attempt trial of Augmentin. She is to being daily Claritin and restart Flonase taking daily. Will need ENT assessment if not resolving. Some mild conjunctival irritation without drainage or vision changes. Giving her ophthalmologic history, recommend assessment by specialist. They are scheduling appointment. Supportive measures discussed.

## 2015-09-12 NOTE — ED Notes (Signed)
right eye pain since yesterday-denies injury-was seen by PCP today and was advised to f/u with ophthalmologist but came to ED-cataract surgery to right eye approx 1 month ago-pt speaks some english-adult son interpreting prn

## 2015-09-12 NOTE — Assessment & Plan Note (Addendum)
Improved with Amoxicillin. Suspect needed stronger ABX. Will attempt trial of Augmentin. She is to being daily Claritin and restart Flonase taking daily. Will need ENT assessment if not resolving. Some mild conjunctival irritation without drainage or vision changes. Giving her ophthalmologic history, recommend assessment by specialist. They are scheduling appointment. Supportive measures discussed.

## 2015-09-12 NOTE — ED Notes (Signed)
MD at bedside. 

## 2015-09-12 NOTE — Assessment & Plan Note (Signed)
Removed via irrigation. Improvement in hearing and ear pressure. Supportive measures discussed.

## 2015-09-12 NOTE — Progress Notes (Signed)
Pre visit review using our clinic review tool, if applicable. No additional management support is needed unless otherwise documented below in the visit note. 

## 2015-09-12 NOTE — ED Provider Notes (Signed)
CSN: [id number]     Arrival date & time 09/12/15  1611 History   First MD Initiated Contact with Patient 09/12/15 1705     Chief Complaint  Patient presents with  . Eye Pain     (Consider location/radiation/quality/duration/timing/severity/associated sxs/prior Treatment) HPI Comments: Patient is a 70 year old female with history of wet ocular degeneration and cataracts who presents with acute onset right eye pain and redness. The pain began 2 days ago. The patient woke up from sleep with the pain. Patient has no change in vision, she has limited vision at baseline. She also sees floaters at baseline. Her main concern is new pain and redness of her right eye. Patient also complains of some superior orbital aching. Patient was seen by her primary care provider today for vertigo and this new eye pain. The vertigo was treated by cleaning her ears out, vertigo meds, ear drops, and oral antibiotics. The vertigo is improved since wax was removed. Her PCP told her to follow-up with ophthalmology tomorrow, but the daughter was not able to get an appointment tomorrow and did not want her mother to be in this pain for much longer. Patient endorses associated photophobia.  Patient is a 70 y.o. female presenting with eye pain. The history is provided by the patient and a relative.  Eye Pain    Past Medical History  Diagnosis Date  . Hypertension   . Diabetes mellitus without complication (HCC)   . Macular degeneration   . Anemia   . Cataract   . Hyperlipidemia   . Osteoporosis   . Thyroid disease   . Trapezius strain 10/23/2014   Past Surgical History  Procedure Laterality Date  . Ankle fracture surgery      Right ankle  . Tonsillectomy      Abcess removed also  . Cataracts  05-2015  . Cataract extraction     Family History  Problem Relation Age of Onset  . Hypertension Maternal Aunt   . Heart disease Maternal Aunt   . Diabetes Neg Hx   . COPD Neg Hx   . CAD Maternal Grandfather     Social History  Substance Use Topics  . Smoking status: Never Smoker   . Smokeless tobacco: None  . Alcohol Use: No   OB History    No data available     Review of Systems  Eyes: Positive for photophobia, pain and redness.      Allergies  Review of patient's allergies indicates no known allergies.  Home Medications   Prior to Admission medications   Medication Sig Start Date End Date Taking? Authorizing Provider  amoxicillin-clavulanate (AUGMENTIN) 875-125 MG tablet Take 1 tablet by mouth 2 (two) times daily. 09/12/15   Waldon Merl, PA-C  BENICAR HCT 40-12.5 MG tablet TAKE 1 TABLET BY MOUTH DAILY 07/19/15   Esperanza Richters, PA-C  carbamide peroxide (DEBROX) 6.5 % otic solution Place 5 drops into the right ear 2 (two) times daily. May use for 4 days 08/25/15   Lattie Haw, MD  empagliflozin (JARDIANCE) 10 MG TABS tablet Take 10 mg by mouth daily. 07/23/15   Reather Littler, MD  fluticasone (FLONASE) 50 MCG/ACT nasal spray Place 2 sprays into both nostrils daily. 09/12/15   Waldon Merl, PA-C  HYDROcodone-acetaminophen (NORCO/VICODIN) 5-325 MG tablet Take 2 tablets by mouth every 4 (four) hours as needed. 09/12/15   Emi Holes, PA-C  insulin aspart (NOVOLOG FLEXPEN) 100 UNIT/ML FlexPen Inject 6 units with breakfast and lunch and 8  units with supper 07/23/15   Reather LittlerAjay Kumar, MD  insulin detemir (LEVEMIR) 100 UNIT/ML injection INJECT 30 UNITS SUBCUTANEOUSLY AT BEDTIME 07/23/15   Reather LittlerAjay Kumar, MD  insulin lispro (HUMALOG KWIKPEN) 100 UNIT/ML KiwkPen 6-8 units before meals 07/23/15   Reather LittlerAjay Kumar, MD  meclizine (ANTIVERT) 25 MG tablet Take 1 tablet (25 mg total) by mouth 3 (three) times daily as needed for dizziness. Patient not taking: Reported on 09/12/2015 08/25/15   Lattie HawStephen A Beese, MD  NIFEDICAL XL 60 MG 24 hr tablet TAKE 1 TABLET BY MOUTH DAILY 08/10/15   Esperanza RichtersEdward Saguier, PA-C  NIFEdipine (PROCARDIA-XL/ADALAT CC) 60 MG 24 hr tablet Take 1 tablet (60 mg total) by mouth daily. 02/28/15   Ramon DredgeEdward  Saguier, PA-C  ONE TOUCH ULTRA TEST test strip Use to check blood sugar 3 times per day dx code E11.9 07/23/15   Reather LittlerAjay Kumar, MD  polyethylene glycol powder (MIRALAX) powder Take 17 g by mouth daily. Mix in 4 - 8 oz of liquid. 08/25/15   Lattie HawStephen A Beese, MD   BP 154/79 mmHg  Pulse 77  Temp(Src) 98.7 F (37.1 C) (Oral)  Resp 18  Ht 5\' 2"  (1.575 m)  Wt 78.019 kg  BMI 31.45 kg/m2  SpO2 98% Physical Exam  Constitutional: She appears well-developed and well-nourished. No distress.  HENT:  Head: Normocephalic and atraumatic.  Mouth/Throat: Oropharynx is clear and moist. No oropharyngeal exudate.  Eyes: EOM are normal. Pupils are equal, round, and reactive to light. Lids are everted and swept, no foreign bodies found. Right eye exhibits no discharge. No foreign body present in the right eye. Left eye exhibits no discharge. No foreign body present in the left eye. Right conjunctiva is injected. Right conjunctiva has no hemorrhage. Left conjunctiva is not injected. Left conjunctiva has no hemorrhage. No scleral icterus. Right eye exhibits no nystagmus. Left eye exhibits no nystagmus.  Pain with upper right extraocular eye movement; consensual light pain in right eye; Tono-Pen right averaged 28, left averaged 19; no uptake noted on fluorescein with Wood's lamp; no foreign body noted on contact applicator eyelid eversion; patient not able to see anything on the ER chart but it's frame; slit-lamp exam conducted by Dr. Germaine PomfretNguyen-see her note for more details  Neck: Normal range of motion. Neck supple. No thyromegaly present.  Cardiovascular: Normal rate, regular rhythm and normal heart sounds.  Exam reveals no gallop and no friction rub.   No murmur heard. Pulmonary/Chest: Effort normal and breath sounds normal. No stridor. No respiratory distress. She has no wheezes. She has no rales.  Abdominal: Soft. Bowel sounds are normal. She exhibits no distension. There is no tenderness. There is no rebound and no  guarding.  Musculoskeletal: She exhibits no edema.  Lymphadenopathy:    She has no cervical adenopathy.  Neurological: She is alert. Coordination normal.  Skin: Skin is warm and dry. No rash noted. She is not diaphoretic. No pallor.  Psychiatric: She has a normal mood and affect.  Nursing note and vitals reviewed.   ED Course  Procedures (including critical care time) Labs Review Labs Reviewed - No data to display  Imaging Review No results found. I have personally reviewed and evaluated these images and lab results as part of my medical decision-making.   EKG Interpretation None      MDM   Patient is well-appearing and stable on discharge. Suspect iritis. Dr. Cyndie ChimeNguyen consulted with the patient's ophthalmology who would like to see the patient tomorrow. We also discussed and included follow-up  with a ophthalmologist in the patient cannot get an appointment with her doctor tomorrow. Patient's pain improved with tetracaine during exam. Patient's pain was resolved throughout ED course with Norco. Patient discharged home with Norco. Patient was also evaluated by Dr. Cyndie Chime who is in agreement with plan. Patient and family understand and are in agreement with plan. Strict return precautions discussed.  Final diagnoses:  Acute right eye pain       Emi Holes, PA-C 09/13/15 0100  Leta Baptist, MD 09/14/15 2147

## 2015-09-13 MED FILL — ATROPINE 1% EYE DROPS: 1 | 90 days supply | Qty: 15 | Fill #0

## 2015-09-14 ENCOUNTER — Other Ambulatory Visit: Payer: Self-pay | Admitting: Medical

## 2015-09-14 MED FILL — TOBRAMYCIN 0.3% EYE DROPS: 0.3 | 13 days supply | Qty: 5 | Fill #0

## 2015-09-16 NOTE — Telephone Encounter (Signed)
Pt was seen by ED for eye pain. Will you call pt to see if she ever saw the ophthalmologist. This is what ED advised and note states she understood that was necessary.

## 2015-09-17 MED ORDER — NIFEDIPINE ER OSMOTIC RELEASE 60 MG PO TB24: 60.0000 mg | ORAL_TABLET | Freq: Every day | ORAL | Status: DC

## 2015-09-17 MED FILL — HYDROCODON-APAP 5-325: 5-325 | 2 days supply | Qty: 10 | Fill #0

## 2015-09-17 MED FILL — NIFEDIPINE ER 60 MG TABLET: 60 | 30 days supply | Qty: 30 | Fill #0

## 2015-09-17 NOTE — Telephone Encounter (Signed)
I refilled pt bp medication.

## 2015-09-17 NOTE — Telephone Encounter (Signed)
Spoke with pt's daughter and she states that she has had to go to the eye doctor for a repeat examination this morning. Per daughter she will have the eye doctor fax over the results from the visit today.

## 2015-09-18 MED FILL — NIFEdipine ER 60 MG TB24: 60 | 30 days supply | Qty: 30 | Fill #0

## 2015-09-19 ENCOUNTER — Other Ambulatory Visit: Payer: Self-pay | Admitting: Medical

## 2015-09-20 MED FILL — BENICAR HCT 40-12.5 MG TAB: 40-12.5 | 30 days supply | Qty: 30 | Fill #0

## 2015-09-28 ENCOUNTER — Telehealth: Payer: Self-pay | Admitting: Endocrinology

## 2015-09-28 MED FILL — LEVEMIR FLEXTOUCH 100 UNITS: 100 | 30 days supply | Qty: 15 | Fill #0

## 2015-09-28 MED FILL — NOVOFINE 32G NEEDLES: 32G X 6 MM | 90 days supply | Qty: 100 | Fill #0

## 2015-09-28 NOTE — Telephone Encounter (Signed)
[facility] calling for levemir pens and the needles due to the change in dosing, thank you

## 2015-10-08 MED FILL — DORZOLAMIDE HCL 2% EYE DROP: 2 | 50 days supply | Qty: 10 | Fill #0

## 2015-10-09 MED FILL — cloNIDine HCL 0.1 MG TABS: 0.1 | 30 days supply | Qty: 60 | Fill #0

## 2015-10-19 MED FILL — TIMOLOL 0.5% EYE DROPS: 0.5 | 50 days supply | Qty: 10 | Fill #0

## 2015-10-19 MED FILL — BRIMONIDINE 0.2% EYE DROP: 0.2 | 25 days supply | Qty: 5 | Fill #0

## 2015-10-25 MED FILL — JARDIANCE 10 MG TABLET: 10 | 30 days supply | Qty: 30 | Fill #2

## 2015-11-12 ENCOUNTER — Other Ambulatory Visit: Payer: Self-pay | Admitting: Endocrinology

## 2015-11-12 MED FILL — LEVEMIR FLEXTOUCH 100 UNITS: 100 | 30 days supply | Qty: 15 | Fill #0

## 2015-11-14 ENCOUNTER — Encounter: Payer: Self-pay | Admitting: Medical

## 2015-11-14 ENCOUNTER — Telehealth: Payer: Self-pay | Admitting: Medical

## 2015-11-14 NOTE — Telephone Encounter (Signed)
Pt needs a bp check. 30 minute appointment to review other chronic issues. Also ask her to make list of all her bp medications that she has every tried before. Her insurance company wants to know complete list of med she tried before being on benicar. I have treated for limited time. I don't see many bp meds listed that she has tried before. So unless she brings list of other meds not sure if her insurance will authorize benicar.

## 2015-11-15 NOTE — Telephone Encounter (Signed)
Pt has a follow up appointment for 11/19/15.

## 2015-11-15 NOTE — Telephone Encounter (Signed)
Pt was called and informed the below, pt understood and schedule appt for 11-19-15.

## 2015-11-16 ENCOUNTER — Telehealth: Payer: Self-pay | Admitting: *Deleted

## 2015-11-16 NOTE — Telephone Encounter (Signed)
PA initiated on [email or web address], awaiting determination. JG//CMA 

## 2015-11-19 ENCOUNTER — Encounter: Payer: Self-pay | Admitting: Medical

## 2015-11-19 ENCOUNTER — Ambulatory Visit (INDEPENDENT_AMBULATORY_CARE_PROVIDER_SITE_OTHER): Payer: BLUE CROSS/BLUE SHIELD | Admitting: Medical

## 2015-11-19 VITALS — BP 126/80 | HR 76 | Temp 98.2°F | Ht 62.0 in | Wt 176.2 lb

## 2015-11-19 DIAGNOSIS — R591 Generalized enlarged lymph nodes: Secondary | ICD-10-CM

## 2015-11-19 DIAGNOSIS — R5383 Other fatigue: Secondary | ICD-10-CM | POA: Diagnosis not present

## 2015-11-19 DIAGNOSIS — I10 Essential (primary) hypertension: Secondary | ICD-10-CM

## 2015-11-19 DIAGNOSIS — H9201 Otalgia, right ear: Secondary | ICD-10-CM

## 2015-11-19 DIAGNOSIS — R42 Dizziness and giddiness: Secondary | ICD-10-CM

## 2015-11-19 MED ORDER — FLUTICASONE PROPIONATE 50 MCG/ACT NA SUSP: 2.0000 | Freq: Every day | NASAL | Status: DC

## 2015-11-19 MED ORDER — BENICAR HCT 40-12.5 MG PO TABS: 1.0000 | ORAL_TABLET | Freq: Every day | ORAL | Status: DC

## 2015-11-19 MED ORDER — CEPHALEXIN 500 MG PO CAPS
500.0000 mg | ORAL_CAPSULE | Freq: Two times a day (BID) | ORAL | Status: DC
Start: 2015-11-19 — End: 2015-12-12

## 2015-11-19 MED ORDER — AZELASTINE HCL 0.1 % NA SOLN: 2.0000 | Freq: Two times a day (BID) | NASAL | Status: DC

## 2015-11-19 MED FILL — FLUTICASONE PROP 50 MCG SPR: 50 | 30 days supply | Qty: 16 | Fill #0

## 2015-11-19 MED FILL — AZELASTINE 0.1% (137 MCG) S: 0.1 | 25 days supply | Qty: 30 | Fill #0

## 2015-11-19 MED FILL — CEPHALEXIN 500 MG CAPSULE: 500 | 10 days supply | Qty: 20 | Fill #0

## 2015-11-19 NOTE — Progress Notes (Signed)
Subjective:    Patient ID: Cynthia Mata, female    DOB: 02-Jan-1946, 70 y.o.   MRN: [id number]  HPI  Pt in for follow up. I got form from insurance. Pt in past had hard times filling benicar 40/12.5. This seemed to help well. Pt states losartan did not help even with hctz. Pt also failed Micardis. So it benicar works for her for years. Pt has no current cardiac or neurologic signs or symptoms.  Pt has been out of med for one week. But pharmacist did call pt today and states they had rx available.  Will fill her insurance form out today.  Pt also states she has occasional rt submandibular area pain. This has been for about 3 months. Pt states she had earl lavaged. She also reports when get up quickly will feel dizzy for aobut 5 seconds.   She also wants her thyroid. Rare fatigue.   Occasional rt ear pain.   Review of Systems  Constitutional: Positive for fatigue. Negative for fever, chills, diaphoresis and activity change.  HENT: Positive for ear pain. Negative for congestion, mouth sores, nosebleeds, postnasal drip and sinus pressure.   Respiratory: Negative for cough, chest tightness and shortness of breath.   Cardiovascular: Negative for chest pain and palpitations.  Gastrointestinal: Negative for nausea, vomiting and abdominal pain.  Musculoskeletal: Negative for back pain, gait problem, neck pain and neck stiffness.  Neurological: Negative for dizziness, tremors, seizures, light-headedness, numbness and headaches.  Hematological: Positive for adenopathy. Does not bruise/bleed easily.  Psychiatric/Behavioral: Negative for behavioral problems, confusion and agitation. The patient is not nervous/anxious.        Objective:   Physical Exam  General Mental Status- Alert. General Appearance- Not in acute distress.   Skin General: Color- Normal Color. Moisture- Normal Moisture.  Neck Carotid Arteries- Normal color. Moisture- Normal Moisture. No carotid bruits. No  JVD.  Chest and Lung Exam Auscultation: Breath Sounds:-Normal.  Cardiovascular Auscultation:Rythm- Regular. Murmurs & Other Heart Sounds:Auscultation of the heart reveals- No Murmurs.  Abdomen Inspection:-Inspeection Normal. Palpation/Percussion:Note:No mass. Palpation and Percussion of the abdomen reveal- Non Tender, Non Distended + BS, no rebound or guarding.    Neurologic Cranial Nerve exam:- CN III-XII intact(No nystagmus), symmetric smile. Strength:- 5/5 equal and symmetric strength both upper and lower extremities.    HEENT Head- Normal. Ear Auditory Canal - Left- Normal. Right - Normal.Tympanic Membrane- Left- Normal. Right- Normal. Eye Sclera/Conjunctiva- Left- Normal. Right- Normal. Nose & Sinuses Nasal Mucosa- Left-  Boggy and Congested. Right-  Boggy and  Congested.Bilateral  No maxillary and no  frontal sinus pressure. Mouth & Throat Lips: Upper Lip- Normal: no dryness, cracking, pallor, cyanosis, or vesicular eruption. Lower Lip-Normal: no dryness, cracking, pallor, cyanosis or vesicular eruption. Buccal Mucosa- Bilateral- No Aphthous ulcers. Oropharynx- No Discharge or Erythema. Tonsils: Characteristics- Bilateral- No Erythema or Congestion. Size/Enlargement- Bilateral- No enlargement. Discharge- bilateral-None.  Rt submandibular node faint enlarge and tender.   Neurologic Cranial Nerve exam:- CN III-XII intact(No nystagmus), symmetric smile.  Strength:- 5/5 equal and symmetric strength both upper and lower extremities.         Assessment & Plan:  For your htn will refill you benicar for one year. Will make copies of form. Send back to [facility]. Give copy to our pharmacy as well.  For enlarged lymph node will check cbc and rx cephalexin. Check area in 2 wks.  For ear pain which may be eustachian tube pressure rx flonase and astelin.  For fatigue check thyroid and  cmp.  For dizziness which appears only on position change be careful and get balance  before ambulating.  Follow up in 3 weeks or as needed   Cynthia Mata, Ramon DredgeEdward, [employer] CorporationPA-C

## 2015-11-19 NOTE — Patient Instructions (Addendum)
For your htn will refill you benicar for one year. Will make copies of form. Send back to [facility]. Give copy to our pharmacy as well.  For enlarged lymph node will check cbc and rx cephalexin. Check area in 2 wks.  For ear pain which may be eustachian tube pressure rx flonase and astelin.  For fatigue check thyroid and cmp.  For dizziness which appears only on position change be careful and get balance before ambulating.  Follow up in 3 weeks or as needed

## 2015-11-19 NOTE — Progress Notes (Signed)
Pre visit review using our clinic review tool, if applicable. No additional management support is needed unless otherwise documented below in the visit note. 

## 2015-11-20 LAB — CBC WITH DIFFERENTIAL/PLATELET
BASOS PCT: 0.5 % (ref 0.0–3.0)
Basophils Absolute: 0 10*3/uL (ref 0.0–0.1)
EOS PCT: 1.8 % (ref 0.0–5.0)
Eosinophils Absolute: 0.1 10*3/uL (ref 0.0–0.7)
HCT: 34.5 % — ABNORMAL LOW (ref 36.0–46.0)
Hemoglobin: 11.2 g/dL — ABNORMAL LOW (ref 12.0–15.0)
LYMPHS ABS: 1.9 10*3/uL (ref 0.7–4.0)
Lymphocytes Relative: 26.3 % (ref 12.0–46.0)
MCHC: 32.5 g/dL (ref 30.0–36.0)
MCV: 87.3 fl (ref 78.0–100.0)
MONO ABS: 0.4 10*3/uL (ref 0.1–1.0)
Monocytes Relative: 5.2 % (ref 3.0–12.0)
NEUTROS ABS: 4.7 10*3/uL (ref 1.4–7.7)
NEUTROS PCT: 66.2 % (ref 43.0–77.0)
PLATELETS: 284 10*3/uL (ref 150.0–400.0)
RBC: 3.95 Mil/uL (ref 3.87–5.11)
RDW: 13.6 % (ref 11.5–15.5)
WBC: 7.1 10*3/uL (ref 4.0–10.5)

## 2015-11-20 LAB — COMPREHENSIVE METABOLIC PANEL
ALT: 23 U/L (ref 0–35)
AST: 23 U/L (ref 0–37)
Albumin: 4.1 g/dL (ref 3.5–5.2)
Alkaline Phosphatase: 93 U/L (ref 39–117)
BUN: 22 mg/dL (ref 6–23)
CALCIUM: 9.2 mg/dL (ref 8.4–10.5)
CHLORIDE: 100 meq/L (ref 96–112)
CO2: 29 meq/L (ref 19–32)
Creatinine, Ser: 0.82 mg/dL (ref 0.40–1.20)
GFR: 73.53 mL/min (ref >60.00)
GLUCOSE: 224 mg/dL — AB (ref 70–99)
POTASSIUM: 3.8 meq/L (ref 3.5–5.1)
Sodium: 137 mEq/L (ref 135–145)
Total Bilirubin: 0.3 mg/dL (ref 0.2–1.2)
Total Protein: 7.4 g/dL (ref 6.0–8.3)

## 2015-11-20 LAB — TSH: TSH: 3.82 u[IU]/mL (ref 0.35–4.50)

## 2015-11-22 NOTE — Progress Notes (Signed)
Quick Note:  Pt has seen results on MyChart and message also sent for patient to call back if any questions. ______ 

## 2015-11-23 MED FILL — BENICAR HCT 40-12.5 MG TAB: 40-12.5 | 30 days supply | Qty: 30 | Fill #0

## 2015-11-28 MED FILL — DORZOLAMIDE HCL 2% EYE DRP: 2 | 50 days supply | Qty: 10 | Fill #1

## 2015-11-28 MED FILL — BRIMONIDINE 0.2% EYE DROP: 0.2 | 25 days supply | Qty: 5 | Fill #1

## 2015-12-06 ENCOUNTER — Other Ambulatory Visit: Payer: Self-pay | Admitting: Endocrinology

## 2015-12-06 MED FILL — NIFEDIPINE ER 60 MG TABLET: 60 | 30 days supply | Qty: 30 | Fill #1

## 2015-12-06 MED FILL — JARDIANCE 10 MG TABLET: 10 | 30 days supply | Qty: 30 | Fill #3

## 2015-12-12 ENCOUNTER — Ambulatory Visit (INDEPENDENT_AMBULATORY_CARE_PROVIDER_SITE_OTHER): Payer: BLUE CROSS/BLUE SHIELD | Admitting: Medical

## 2015-12-12 ENCOUNTER — Encounter: Payer: Self-pay | Admitting: Medical

## 2015-12-12 VITALS — BP 120/78 | HR 68 | Temp 98.4°F | Ht 62.0 in | Wt 172.8 lb

## 2015-12-12 DIAGNOSIS — Z1211 Encounter for screening for malignant neoplasm of colon: Secondary | ICD-10-CM

## 2015-12-12 DIAGNOSIS — E1165 Type 2 diabetes mellitus with hyperglycemia: Secondary | ICD-10-CM

## 2015-12-12 DIAGNOSIS — Z23 Encounter for immunization: Secondary | ICD-10-CM | POA: Diagnosis not present

## 2015-12-12 DIAGNOSIS — K5900 Constipation, unspecified: Secondary | ICD-10-CM

## 2015-12-12 DIAGNOSIS — I10 Essential (primary) hypertension: Secondary | ICD-10-CM | POA: Diagnosis not present

## 2015-12-12 NOTE — Progress Notes (Signed)
Subjective:    Patient ID: Cynthia Mata, female    DOB: 01/01/1946, 70 y.o.   MRN: [id number]  HPI   Pt in for follow up. She feels good today.  Pt bp is well controlled. Pt did not have problem filling her benicar hct. No cardiac or neurologic signs or symptoms.  Pt is diabetic. She has appointment with endocrinologist. Pt and son state they will call. Late for appointment 2 months.(Missed appointment due to eye problems.  Pt gives update on cataract surgery. Her vision is better. Also had iritis. But those conditions are much improved.  Pt dizziness has resolved with ear infection. I rx flonase, astelin, and cephelexin.  Will update your pneumonia vaccine.  Pt needs tdap. Pt needs colonoscocpy. Last one done in 1998  Foot exam done today for diabetes. See quality metrics.  Pt has slight pain umbilical area when she strains to use bathroom when she is constipated. Pain rare and occasional. When strains.  Pt not sure if shingles vaccines.(she will call her insurance to see if covered).  Pt has constipation almost daily basis. She states high fiber diet. Hydrates well.      Review of Systems  Constitutional: Negative for fever, chills, diaphoresis, activity change and fatigue.  HENT: Negative for congestion, ear pain, postnasal drip, rhinorrhea, sinus pressure and sneezing.        Ear better. See hpi.  Respiratory: Negative for cough, chest tightness and shortness of breath.   Cardiovascular: Negative for chest pain, palpitations and leg swelling.  Gastrointestinal: Positive for constipation. Negative for nausea, vomiting, abdominal pain, diarrhea, blood in stool and abdominal distention.       See hpi.  Mild umbilical pain at times if has to strain very hard.  Endocrine: Negative for polydipsia, polyphagia and polyuria.  Musculoskeletal: Negative for neck pain and neck stiffness.  Neurological: Negative for dizziness, seizures, weakness, light-headedness  and headaches.       See hpi dizziness resolved.  Psychiatric/Behavioral: Negative for behavioral problems, confusion and agitation. The patient is not nervous/anxious.     Past Medical History  Diagnosis Date  . Hypertension   . Diabetes mellitus without complication (HCC)   . Macular degeneration   . Anemia   . Cataract   . Hyperlipidemia   . Osteoporosis   . Thyroid disease   . Trapezius strain 10/23/2014     Social History   Social History  . Marital Status: Married    Spouse Name: N/A  . Number of Children: 5  . Years of Education: N/A   Occupational History  . Not on file.   Social History Main Topics  . Smoking status: Never Smoker   . Smokeless tobacco: Not on file  . Alcohol Use: No  . Drug Use: No  . Sexual Activity: Not on file   Other Topics Concern  . Not on file   Social History Narrative   Lives with daughter.      Past Surgical History  Procedure Laterality Date  . Ankle fracture surgery      Right ankle  . Tonsillectomy      Abcess removed also  . Cataracts  05-2015  . Cataract extraction      Family History  Problem Relation Age of Onset  . Hypertension Maternal Aunt   . Heart disease Maternal Aunt   . Diabetes Neg Hx   . COPD Neg Hx   . CAD Maternal Grandfather     No Known Allergies  Current Outpatient Prescriptions on File Prior to Visit  Medication Sig Dispense Refill  . azelastine (ASTELIN) 0.1 % nasal spray Place 2 sprays into both nostrils 2 (two) times daily. Use in each nostril as directed 30 mL 3  . BENICAR HCT 40-12.5 MG tablet Take 1 tablet by mouth daily. 30 tablet 11  . carbamide peroxide (DEBROX) 6.5 % otic solution Place 5 drops into the right ear 2 (two) times daily. May use for 4 days 15 mL 0  . empagliflozin (JARDIANCE) 10 MG TABS tablet Take 10 mg by mouth daily. 30 tablet 3  . HYDROcodone-acetaminophen (NORCO/VICODIN) 5-325 MG tablet Take 2 tablets by mouth every 4 (four) hours as needed. 10 tablet 0  .  insulin aspart (NOVOLOG FLEXPEN) 100 UNIT/ML FlexPen Inject 6 units with breakfast and lunch and 8 units with supper 15 mL 2  . insulin detemir (LEVEMIR) 100 UNIT/ML injection INJECT 30 UNITS SUBCUTANEOUSLY AT BEDTIME 10 mL 3  . insulin lispro (HUMALOG KWIKPEN) 100 UNIT/ML KiwkPen 6-8 units before meals 15 mL 1  . LEVEMIR FLEXTOUCH 100 UNIT/ML Pen INJECT 30 UNITS SUBCUTANEOUSLY AT BEDTIME (MAY USE UP TO A MAX OF 50 UNITS AT BEDTIME IF NEEDED) 15 mL 0  . meclizine (ANTIVERT) 25 MG tablet Take 1 tablet (25 mg total) by mouth 3 (three) times daily as needed for dizziness. 15 tablet 1  . NIFEdipine (NIFEDICAL XL) 60 MG 24 hr tablet Take 1 tablet (60 mg total) by mouth daily. 30 tablet 5  . NOVOFINE 32G X 6 MM MISC USE TO INJECT LEVEMIR AT BEDTIME 100 each 0  . ONE TOUCH ULTRA TEST test strip Use to check blood sugar 3 times per day dx code E11.9 100 each 3  . polyethylene glycol powder (MIRALAX) powder Take 17 g by mouth daily. Mix in 4 - 8 oz of liquid. 255 g 0   No current facility-administered medications on file prior to visit.    BP 120/78 mmHg  Pulse 68  Temp(Src) 98.4 F (36.9 C) (Oral)  Ht 5\' 2"  (1.575 m)  Wt 172 lb 12.8 oz (78.382 kg)  BMI 31.60 kg/m2  SpO2 98%       Objective:   Physical Exam   General Mental Status- Alert. General Appearance- Not in acute distress.   Skin General: Color- Normal Color. Moisture- Normal Moisture.  Neck Carotid Arteries- Normal color. Moisture- Normal Moisture. No carotid bruits. No JVD.  Chest and Lung Exam Auscultation: Breath Sounds:-Normal.  Cardiovascular Auscultation:Rythm- Regular. Murmurs & Other Heart Sounds:Auscultation of the heart reveals- No Murmurs.  Abdomen Inspection:-Inspeection Normal. Palpation/Percussion:Note:No mass. Palpation and Percussion of the abdomen reveal- Non Tender( onlysmall faint umbilical hernia felt on straight leg lift), Non Distended + BS, no rebound or guarding.   Neurologic Cranial Nerve  exam:- CN III-XII intact(No nystagmus), symmetric smile. Strength:- 5/5 equal and symmetric strength both upper and lower extremities.     Assessment & Plan:  Your bp is well controlled today. Continue benicar hct.  Schedule appointment with Dr. Lucianne MussKumar for diabetes.  We will give you tdap today, update second pnuemonia vaccine, and wrote referral for colonosocpy.  Foot exam done today.  For constipation on and off. Hydrate well. Every 2nd to 3rd day no bm then use dulcolax or miralax. If no bm for 3 days contact us/be seen.   Small faint umbilical hernia. If daily constant pain or increase in size refer to general surgeon.  Call insurance on if shingles vaccine covered.  Will put future  order lipid panel. Can get done later this week or early next week.  Follow up in 3 months or as needed  Cynthia Mata, Ramon Dredge, [employer]

## 2015-12-12 NOTE — Patient Instructions (Addendum)
Your bp is well controlled today. Continue benicar hct.  Schedule appointment with Dr. Lucianne MussKumar for diabetes.  We will give you tdap today, update second pnuemonia vaccine, and wrote referral for colonosocpy.  Foot exam done today.  For constipation on and off. Hydrate well. Every 2nd to 3rd day no bm then use dulcolax or miralax. If no bm for 3 days contact us/be seen.   Small faint umbilical hernia. If daily constant pain or increase in size refer to general surgeon.  Call insurance on if shingles vaccine covered.  Will put future order lipid panel. Can get done later this week or early next week.  Follow up in 3 months or as needed

## 2015-12-12 NOTE — Progress Notes (Signed)
Pre visit review using our clinic review tool, if applicable. No additional management support is needed unless otherwise documented below in the visit note. 

## 2015-12-24 ENCOUNTER — Ambulatory Visit (INDEPENDENT_AMBULATORY_CARE_PROVIDER_SITE_OTHER): Payer: BLUE CROSS/BLUE SHIELD | Admitting: Endocrinology

## 2015-12-24 ENCOUNTER — Encounter: Payer: Self-pay | Admitting: Endocrinology

## 2015-12-24 VITALS — BP 118/70 | HR 71 | Ht 62.0 in | Wt 173.0 lb

## 2015-12-24 DIAGNOSIS — Z794 Long term (current) use of insulin: Secondary | ICD-10-CM | POA: Diagnosis not present

## 2015-12-24 DIAGNOSIS — E1165 Type 2 diabetes mellitus with hyperglycemia: Secondary | ICD-10-CM | POA: Diagnosis not present

## 2015-12-24 LAB — POCT GLYCOSYLATED HEMOGLOBIN (HGB A1C): Hemoglobin A1C: 9.1

## 2015-12-24 MED ORDER — EMPAGLIFLOZIN 25 MG PO TABS: 25.0000 mg | ORAL_TABLET | Freq: Every day | ORAL | Status: DC

## 2015-12-24 MED ORDER — METFORMIN HCL ER (MOD) 1000 MG PO TB24: 1000.0000 mg | ORAL_TABLET | Freq: Every day | ORAL | Status: DC

## 2015-12-24 MED ORDER — METFORMIN HCL 1000 MG PO TABS: 1000.0000 mg | ORAL_TABLET | Freq: Two times a day (BID) | ORAL | Status: DC

## 2015-12-24 MED ORDER — INSULIN DEGLUDEC 100 UNIT/ML ~~LOC~~ SOPN: 34.0000 [IU] | PEN_INJECTOR | Freq: Every day | SUBCUTANEOUS | Status: DC

## 2015-12-24 MED FILL — BENICAR HCT 40-12.5 MG TAB: 40-12.5 | 30 days supply | Qty: 30 | Fill #1

## 2015-12-24 MED FILL — JARDIANCE 25 MG TABLET: 25 | 30 days supply | Qty: 30 | Fill #0

## 2015-12-24 MED FILL — TIMOLOL 0.5% EYE DROPS: 0.5 | 50 days supply | Qty: 10 | Fill #1

## 2015-12-24 MED FILL — BRIMONIDINE 0.2% EYE DROP: 0.2 | 25 days supply | Qty: 5 | Fill #2

## 2015-12-24 NOTE — Progress Notes (Signed)
Patient ID: Cynthia Mata, female   DOB: Aug 17, 1945, 70 y.o.   MRN: [id number]           Reason for Appointment: Follow-up for Type 2 Diabetes  Referring physician: Saguier  History of Present Illness:          Diagnosis: Type 2 diabetes mellitus, date of diagnosis: 2001        Past history: At the time of diagnosis she had symptoms of increased thirst and urination and very high blood sugars, was obese.   She was [place] in her [place] with oral medications possibly glipizide and metformin She also changed her diet and was able to lose weight  However she does not think her blood sugars had been consistently controlled In 2012 she was given Levemir insulin but not clear if this helped her sugar control as her A1c was still 9.5 in 2014 She was apparently continued on Metaglip also which he had been taking for a while; she has usually taken only 1 tablet twice a day of the 5/500 tablets.  Has not had any abdominal pain or diarrhea with metformin previously  Recent history:   INSULIN regimen is described as:  Levemir 30 in am Novolog 5-6 units daily-twice a day Oral hypoglycemic drugs the patient is taking are: Jardiance 10 mg daily   She has not been seen in follow-up for several months again On her last visit she was not taking mealtime insulin and was supposed to start Novolog with every meal  Her A1c is higher than usual at 9.1  Current management, blood sugar patterns and problems:  Her fasting blood sugars are mostly high  She has only occasional readings midday and afternoon and these are frequently high.  Her blood sugars appear to be progressively higher from morning to evening and highest at suppertime  She does not understand the need to take mealtime insulin with every meal and is mostly taking it with lunch  Does not do readings after supper           Side effects from medications have been: None  Compliance with the medical regimen:  Fair Hypoglycemia:    Glucose monitoring:  done 2-3 times a day         Glucometer: One Touch.      Blood Glucose readings by time of day and averages   Mean values apply above for all meters except median for One Touch  PRE-MEAL Fasting Lunch Dinner Bedtime Overall  Glucose range: 94-322   207-352  190-365    Mean/median: 170   280  255  217    Self-care: The diet that the patient has been following is: tries to limit Sweets, carbs and fats and is eating more vegetables .     Meals: 3 meals per day. Breakfast is eggs, beans and/or bread  .   Has chicken and vegetables for lunch and dinner with some tortillas.  Has bread and nuts for snacks    Dinner is at 6-9 pm          Exercise:  walking 3 times a week  10-15 min        Dietician visit, most recent: Never .               Weight history:  Wt Readings from Last 3 Encounters:  12/24/15 173 lb (78.472 kg)  12/12/15 172 lb 12.8 oz (78.382 kg)  11/19/15 176 lb 3.2 oz (79.924 kg)  Glycemic control:   Lab Results  Component Value Date   HGBA1C 9.1 12/24/2015   HGBA1C 8.7* 07/16/2015   HGBA1C 7.7* 10/23/2014   Lab Results  Component Value Date   MICROALBUR 3.1* 10/23/2014   LDLCALC 75 08/12/2014   CREATININE 0.82 11/19/2015         Medication List       This list is accurate as of: 12/24/15 11:59 PM.  Always use your most recent med list.               azelastine 0.1 % nasal spray  Commonly known as:  ASTELIN  Place 2 sprays into both nostrils 2 (two) times daily. Use in each nostril as directed     BENICAR HCT 40-12.5 MG tablet  Generic drug:  olmesartan-hydrochlorothiazide  Take 1 tablet by mouth daily.     carbamide peroxide 6.5 % otic solution  Commonly known as:  DEBROX  Place 5 drops into the right ear 2 (two) times daily. May use for 4 days     empagliflozin 25 MG Tabs tablet  Commonly known as:  JARDIANCE  Take 25 mg by mouth daily.     HYDROcodone-acetaminophen 5-325 MG tablet  Commonly  known as:  NORCO/VICODIN  Take 2 tablets by mouth every 4 (four) hours as needed.     insulin aspart 100 UNIT/ML FlexPen  Commonly known as:  NOVOLOG FLEXPEN  Inject 6 units with breakfast and lunch and 8 units with supper     Insulin Degludec 100 UNIT/ML Sopn  Commonly known as:  TRESIBA FLEXTOUCH  Inject 34 Units into the skin daily.     LEVEMIR FLEXTOUCH 100 UNIT/ML Pen  Generic drug:  Insulin Detemir  INJECT 30 UNITS SUBCUTANEOUSLY AT BEDTIME (MAY USE UP TO A MAX OF 50 UNITS AT BEDTIME IF NEEDED)     meclizine 25 MG tablet  Commonly known as:  ANTIVERT  Take 1 tablet (25 mg total) by mouth 3 (three) times daily as needed for dizziness.     metFORMIN 1000 MG tablet  Commonly known as:  GLUCOPHAGE  Take 1 tablet (1,000 mg total) by mouth 2 (two) times daily with a meal.     NIFEdipine 60 MG 24 hr tablet  Commonly known as:  NIFEDICAL XL  Take 1 tablet (60 mg total) by mouth daily.     NOVOFINE 32G X 6 MM Misc  Generic drug:  Insulin Pen Needle  USE TO INJECT LEVEMIR AT BEDTIME     ONE TOUCH ULTRA TEST test strip  Generic drug:  glucose blood  Use to check blood sugar 3 times per day dx code E11.9     polyethylene glycol powder powder  Commonly known as:  MIRALAX  Take 17 g by mouth daily. Mix in 4 - 8 oz of liquid.        Allergies: No Known Allergies  Past Medical History  Diagnosis Date  . Hypertension   . Diabetes mellitus without complication (HCC)   . Macular degeneration   . Anemia   . Cataract   . Hyperlipidemia   . Osteoporosis   . Thyroid disease   . Trapezius strain 10/23/2014    Past Surgical History  Procedure Laterality Date  . Ankle fracture surgery      Right ankle  . Tonsillectomy      Abcess removed also  . Cataracts  05-2015  . Cataract extraction      Family History  Problem Relation Age of Onset  .  Hypertension Maternal Aunt   . Heart disease Maternal Aunt   . Diabetes Neg Hx   . COPD Neg Hx   . CAD Maternal Grandfather      Social History:  reports that she has never smoked. She does not have any smokeless tobacco history on file. She reports that she does not drink alcohol or use illicit drugs.    Review of Systems    Most recent eye exam was ?  3/16         Lipids: On Rx since 7/15 and LDL controlled with Lipitor       Lab Results  Component Value Date   CHOL 146 08/12/2014   HDL 35* 08/12/2014   LDLCALC 75 08/12/2014   TRIG 179* 08/12/2014   CHOLHDL 4.2 08/12/2014                  Thyroid: Currently not on any supplements and TSH is Back to normal   Lab Results  Component Value Date   TSH 3.82 11/19/2015   TSH 4.753* 08/11/2014   TSH 3.74 06/29/2014       The blood pressure has been high for 15 years treated with multiple medications by PCP   LABS:  Office Visit on 12/24/2015  Component Date Value Ref Range Status  . Hemoglobin A1C 12/24/2015 9.1   Final    Physical Examination:  BP 118/70 mmHg  Pulse 71  Ht 5\' 2"  (1.575 m)  Wt 173 lb (78.472 kg)  BMI 31.63 kg/m2  SpO2 97%     ASSESSMENT/PLAN:  Diabetes type 2, uncontrolled with obesity and BMI 34  See history of present illness for current blood sugar patterns, problems identified and current management She has not been seen in follow-up Again and is coming back after 4 months As discussed above she is still confused about her mealtime insulin and is taking this mostly once a day He also has persistently high fasting readings and appears to be more insulin resistant Not clear if she is benefiting from low-dose Jardiance at this time  Discussed with her and her daughter in detail the need for taking mealtime insulin with each meal regardless of pre-meal blood sugar She will probably need more insulin at lunch and dinner when she is frequently getting more carbohydrate She also needs to start checking more readings after meals which she is not doing much Since her blood sugars are fairly high before breakfast and  supper she will need to switch to [place]iba instead of Levemir which will be more effective Meanwhile she can increase Levemir to 34 units Discussed adjusting basal insulin for fasting hyperglycemia  Since she is complaining about cost of her One Touch test strip she will find out from her insurance what is the covered products She will also increase her Jardiance to 25 mg For her insulin resistance she will restart metformin 1 g twice a day    Patient Instructions  Jardiance 20mg  daily then next Rx 25mg   Increase Levemir to 34 units daily  Novolog 6 at Bfst 10 at lunch and dinner  May try Tresiba instead of Levemir  What meter is covered ?       Counseling time on subjects discussed above is over 50% of today's 25 minute visit   Cynthia Mata 12/25/2015, 8:40 AM   Note: This office note was prepared with Insurance underwriterDragon voice recognition system technology. Any transcriptional errors that result from this process are unintentional.

## 2015-12-24 NOTE — Patient Instructions (Addendum)
Jardiance 20mg  daily then next Rx 25mg   Increase Levemir to 34 units daily  Novolog 6 at Bfst 10 at lunch and dinner  May try Tresiba instead of Levemir  What meter is covered ?

## 2015-12-25 MED FILL — metFORMIN HCL 1000 MG TABS: 1000 | 30 days supply | Qty: 60 | Fill #0

## 2015-12-27 ENCOUNTER — Telehealth: Payer: Self-pay | Admitting: Endocrinology

## 2015-12-27 MED ORDER — INSULIN GLARGINE 300 UNIT/ML ~~LOC~~ SOPN: 34.0000 [IU] | PEN_INJECTOR | Freq: Every day | SUBCUTANEOUS | Status: DC

## 2015-12-27 MED FILL — TOUJEO SOLOSTAR 300 UNITS/M: 300 | 27 days supply | Qty: 3 | Fill #0

## 2015-12-27 MED FILL — NOVOFINE 32G NEEDLES: 32G X 6 MM | 90 days supply | Qty: 100 | Fill #0

## 2015-12-27 NOTE — Telephone Encounter (Signed)
Tashawn calling from [facility], checking on  PA request for medication Insulin Degludec (TR[facility]IBA FLE[facility]OUCH) [facility] # [phone]

## 2015-12-27 NOTE — Telephone Encounter (Signed)
Rx changed to Toujeo with the same instructions as the [place]-Bissauresiba. Pharmacy notified.

## 2016-01-01 MED FILL — DORZOLAMIDE HCL 2% EYE DRP: 2 | 50 days supply | Qty: 10 | Fill #2

## 2016-01-28 MED FILL — NIFEdipine ER 60 MG TB24: 60 | 30 days supply | Qty: 30 | Fill #1

## 2016-02-04 ENCOUNTER — Other Ambulatory Visit: Payer: BLUE CROSS/BLUE SHIELD

## 2016-02-05 ENCOUNTER — Ambulatory Visit: Payer: BLUE CROSS/BLUE SHIELD | Admitting: Endocrinology

## 2016-02-05 ENCOUNTER — Other Ambulatory Visit (INDEPENDENT_AMBULATORY_CARE_PROVIDER_SITE_OTHER): Payer: BLUE CROSS/BLUE SHIELD

## 2016-02-05 ENCOUNTER — Telehealth: Payer: Self-pay | Admitting: Medical

## 2016-02-05 DIAGNOSIS — E1165 Type 2 diabetes mellitus with hyperglycemia: Secondary | ICD-10-CM | POA: Diagnosis not present

## 2016-02-05 DIAGNOSIS — Z794 Long term (current) use of insulin: Secondary | ICD-10-CM | POA: Diagnosis not present

## 2016-02-05 LAB — BASIC METABOLIC PANEL
BUN: 23 mg/dL (ref 6–23)
CALCIUM: 8.8 mg/dL (ref 8.4–10.5)
CHLORIDE: 105 meq/L (ref 96–112)
CO2: 30 meq/L (ref 19–32)
CREATININE: 0.79 mg/dL (ref 0.40–1.20)
GFR: 76.72 mL/min (ref >60.00)
GLUCOSE: 146 mg/dL — AB (ref 70–99)
Potassium: 4.1 mEq/L (ref 3.5–5.1)
Sodium: 139 mEq/L (ref 135–145)

## 2016-02-05 NOTE — Telephone Encounter (Signed)
Pt's daughter [phone] called in because they are having trouble getting pt's medication BENICAR. She would like a call back when possible.

## 2016-02-06 LAB — FRUCTOSAMINE: Fructosamine: 302 umol/L — ABNORMAL HIGH (ref 0–285)

## 2016-02-07 ENCOUNTER — Encounter: Payer: Self-pay | Admitting: Medical

## 2016-02-07 ENCOUNTER — Ambulatory Visit (INDEPENDENT_AMBULATORY_CARE_PROVIDER_SITE_OTHER): Payer: BLUE CROSS/BLUE SHIELD | Admitting: Endocrinology

## 2016-02-07 VITALS — BP 126/84 | HR 60 | Temp 98.9°F | Wt 172.0 lb

## 2016-02-07 DIAGNOSIS — I10 Essential (primary) hypertension: Secondary | ICD-10-CM

## 2016-02-07 DIAGNOSIS — E1165 Type 2 diabetes mellitus with hyperglycemia: Secondary | ICD-10-CM | POA: Diagnosis not present

## 2016-02-07 DIAGNOSIS — Z794 Long term (current) use of insulin: Secondary | ICD-10-CM | POA: Diagnosis not present

## 2016-02-07 NOTE — Telephone Encounter (Signed)
Patient's daughter states that she will download the form and fax it to the office. States that the patient has been without her medication for 4 days and she needs to get it today.

## 2016-02-07 NOTE — Telephone Encounter (Signed)
Patient's daughter states that the insurance company informed her that they need a Request for Waiver of Brand Name form completed in order for patient to receive medication.  States that the form was faxed to our office today.

## 2016-02-07 NOTE — Progress Notes (Signed)
Patient ID: Cynthia Mata, female   DOB: January 12, 1946, 70 y.o.   MRN: [id number]           Reason for Appointment: Follow-up for Type 2 Diabetes  Referring physician: Saguier  History of Present Illness:          Diagnosis: Type 2 diabetes mellitus, date of diagnosis: 2001        Past history: At the time of diagnosis she had symptoms of increased thirst and urination and very high blood sugars, was obese.   She was treated in her [place] with oral medications possibly glipizide and metformin She also changed her diet and was able to lose weight  However she does not think her blood sugars had been consistently controlled In 2012 she was given Levemir insulin but not clear if this helped her sugar control as her A1c was still 9.5 in 2014 She was apparently continued on Metaglip also which he had been taking for a while; she has usually taken only 1 tablet twice a day of the 5/500 tablets.  Has not had any abdominal pain or diarrhea with metformin previously  Recent history:   INSULIN regimen is described as:Toujeo 30 in am. Novolog 5-6 units daily-twice a day Oral hypoglycemic drugs the patient is taking are: Jardiance 25 mg daily, Metformin   On her last visit she was not taking mealtime insulin consistently and only when the blood sugar was high Also Levemir was changed to Toujeo  Her A1c previously was 9.1 but now her fructosamine is 302  Current management, blood sugar patterns and problems:  Her fasting blood sugars are mostly near normal recently  She was started on 34 units of Toujeo but she reduced it to 30 since she thinks it caused dizziness  Also has taken higher dose of Jardiance and also started on metformin 1 g twice a day  She thinks her blood sugars are higher than when she was visiting her home country for a month  Did not bring her monitor for download and not clear if blood sugars are consistently high  She thinks she had one episode of  hypoglycemia at 2 AM without changing her diet the previous evening but may have walked more  She has only occasional readings midday and not clear if these are high           Side effects from medications have been: None  Compliance with the medical regimen: Fair Hypoglycemia: Once at 2 AM    Glucose monitoring:  done 2-3 times a day         Glucometer: One Touch.      Blood Glucose readings by recall:  Mean values apply above for all meters except median for One Touch  PRE-MEAL Fasting Lunch Dinner Bedtime Overall  Glucose range: 90-140 200 170-300 250   Mean/median:         Self-care: The diet that the patient has been following is: tries to limit Sweets, carbs and fats and more vegetables .     Meals: 3 meals per day. Breakfast is eggs, beans and/or bread  .   Has chicken and vegetables for lunch and dinner with some tortillas.  Has bread and nuts for snacks    Dinner is at 6-9 pm          Exercise:  walking 3 times a week, Recently up to one hour, mostly in the evenings        Dietician visit, most recent:  Never .               Weight history:  Wt Readings from Last 3 Encounters:  02/07/16 172 lb (78 kg)  12/24/15 173 lb (78.5 kg)  12/12/15 172 lb 12.8 oz (78.4 kg)    Glycemic control:   Lab Results  Component Value Date   HGBA1C 9.1 12/24/2015   HGBA1C 8.7 (H) 07/16/2015   HGBA1C 7.7 (H) 10/23/2014   Lab Results  Component Value Date   MICROALBUR 3.1 (H) 10/23/2014   LDLCALC 75 08/12/2014   CREATININE 0.79 02/05/2016         Medication List       Accurate as of 02/07/16 12:24 PM. Always use your most recent med list.          azelastine 0.1 % nasal spray Commonly known as:  ASTELIN Place 2 sprays into both nostrils 2 (two) times daily. Use in each nostril as directed   BENICAR HCT 40-12.5 MG tablet Generic drug:  olmesartan-hydrochlorothiazide Take 1 tablet by mouth daily.   carbamide peroxide 6.5 % otic solution Commonly known as:   DEBROX Place 5 drops into the right ear 2 (two) times daily. May use for 4 days   empagliflozin 25 MG Tabs tablet Commonly known as:  JARDIANCE Take 25 mg by mouth daily.   HYDROcodone-acetaminophen 5-325 MG tablet Commonly known as:  NORCO/VICODIN Take 2 tablets by mouth every 4 (four) hours as needed.   insulin aspart 100 UNIT/ML FlexPen Commonly known as:  NOVOLOG FLEXPEN Inject 6 units with breakfast and lunch and 8 units with supper   Insulin Glargine 300 UNIT/ML Sopn Commonly known as:  TOUJEO SOLOSTAR Inject 34 Units into the skin daily.   meclizine 25 MG tablet Commonly known as:  ANTIVERT Take 1 tablet (25 mg total) by mouth 3 (three) times daily as needed for dizziness.   metFORMIN 1000 MG tablet Commonly known as:  GLUCOPHAGE Take 1 tablet (1,000 mg total) by mouth 2 (two) times daily with a meal.   NIFEdipine 60 MG 24 hr tablet Commonly known as:  NIFEDICAL XL Take 1 tablet (60 mg total) by mouth daily.   NOVOFINE 32G X 6 MM Misc Generic drug:  Insulin Pen Needle USE TO INJECT LEVEMIR AT BEDTIME   ONE TOUCH ULTRA TEST test strip Generic drug:  glucose blood Use to check blood sugar 3 times per day dx code E11.9   polyethylene glycol powder powder Commonly known as:  MIRALAX Take 17 g by mouth daily. Mix in 4 - 8 oz of liquid.       Allergies: No Known Allergies  Past Medical History:  Diagnosis Date  . Anemia   . Cataract   . Diabetes mellitus without complication (HCC)   . Hyperlipidemia   . Hypertension   . Macular degeneration   . Osteoporosis   . Thyroid disease   . Trapezius strain 10/23/2014    Past Surgical History:  Procedure Laterality Date  . ANKLE FRACTURE SURGERY     Right ankle  . CATARACT EXTRACTION    . cataracts  05-2015  . TONSILLECTOMY     Abcess removed also    Family History  Problem Relation Age of Onset  . Hypertension Maternal Aunt   . Heart disease Maternal Aunt   . Diabetes Neg Hx   . COPD Neg Hx   . CAD  Maternal Grandfather     Social History:  reports that she has never smoked. She does  not have any smokeless tobacco history on file. She reports that she does not drink alcohol or use drugs.    Review of Systems    Most recent eye exam was ?  3/16         Lipids: On Rx since 7/15 and LDL controlled with Lipitor       Lab Results  Component Value Date   CHOL 146 08/12/2014   HDL 35 (L) 08/12/2014   LDLCALC 75 08/12/2014   TRIG 179 (H) 08/12/2014   CHOLHDL 4.2 08/12/2014                  Thyroid: Currently not on any supplements and TSH is Back to normal   Lab Results  Component Value Date   TSH 3.82 11/19/2015   TSH 4.753 (H) 08/11/2014   TSH 3.74 06/29/2014       The blood pressure has been high for 15 years treated with multiple medications by PCP   LABS:  Lab on 02/05/2016  Component Date Value Ref Range Status  . Fructosamine 02/06/2016 302* 0 - 285 umol/L Final   Comment: Published reference interval for apparently healthy subjects between age 13 and 26 is 5 - 285 umol/L and in a poorly controlled diabetic population is 228 - 563 umol/L with a mean of 396 umol/L.   Marland Kitchen Sodium 02/05/2016 139  135 - 145 mEq/L Final  . Potassium 02/05/2016 4.1  3.5 - 5.1 mEq/L Final  . Chloride 02/05/2016 105  96 - 112 mEq/L Final  . CO2 02/05/2016 30  19 - 32 mEq/L Final  . Glucose, Bld 02/05/2016 146* 70 - 99 mg/dL Final  . BUN 16/03/9603 23  6 - 23 mg/dL Final  . Creatinine, Ser 02/05/2016 0.79  0.40 - 1.20 mg/dL Final  . Calcium 54/02/8118 8.8  8.4 - 10.5 mg/dL Final  . GFR 14/78/2956 76.72  >60.00 mL/min Final    Physical Examination:  BP 126/84 (BP Location: Left Arm, Patient Position: Sitting, Cuff Size: Normal)   Pulse 60   Temp 98.9 F (37.2 C) (Oral)   Wt 172 lb (78 kg)   BMI 31.46 kg/m      ASSESSMENT/PLAN:  Diabetes type 2, uncontrolled with obesity and BMI 34  See history of present illness for current blood sugar patterns, problems identified and  current management  With using Toujeo and trying to take mealtime insulin consistently her blood sugar control is somewhat better with fructosamine 302 However she thinks her blood sugars are higher when she is back in this country and better when she is in her home town which she was living in the last month However she did not bring her monitor for download and not clear what her blood sugar patterns are She claims that her blood sugars are significantly high after lunch and supper Currently taking only 5 units of NovoLog for meals and does have some amount of carbohydrate at every meal May be getting her largest meal at lunch  FASTING blood sugars are improved with taking Toujeo instead of Levemir at the same dose and also may benefit from starting metformin which she is tolerating  HYPERTENSION: She is not having consistent medication regimen and she can discuss  alternatives to Benicar with PCP   Recommendations:  Her daughter will bring the blood sugar monitor back for download to assess her blood sugar patterns  Will increase her NovoLog at lunch and dinner to at least 8 units  Discussed targeting post meal  readings to be at least under 180  If she is very active with walking in the evening she can reduce her suppertime dose to 5 units and have a bedtime snack  No change in Toujeo as yet, discussed that this will improve her fasting readings and will adjust it if morning sugars are out of target range   Continue Jardiance and metformin, will need periodic follow-up of renal function  Follow-up with PCP for evaluation of dizziness  Consultation with dietitian, she does need weight loss  Needs follow-up lipids    Patient Instructions  Take 8 units Novolog at lunch and dinner unless walking a lot take 5 units  Bedtime snack if walking more   Counseling time on subjects discussed above is over 50% of today's 25 minute visit   Cynthia Mata 02/07/2016, 12:24 PM   Note:  This office note was prepared with Dragon voice recognition system technology. Any transcriptional errors that result from this process are unintentional.

## 2016-02-07 NOTE — Patient Instructions (Signed)
Take 8 units Novolog at lunch and dinner unless walking a lot take 5 units  Bedtime snack if walking more

## 2016-02-08 ENCOUNTER — Other Ambulatory Visit (INDEPENDENT_AMBULATORY_CARE_PROVIDER_SITE_OTHER): Payer: BLUE CROSS/BLUE SHIELD

## 2016-02-08 ENCOUNTER — Telehealth: Payer: Self-pay | Admitting: Medical

## 2016-02-08 DIAGNOSIS — I10 Essential (primary) hypertension: Secondary | ICD-10-CM

## 2016-02-08 LAB — COMPREHENSIVE METABOLIC PANEL
ALK PHOS: 86 U/L (ref 39–117)
ALT: 29 U/L (ref 0–35)
AST: 26 U/L (ref 0–37)
Albumin: 4 g/dL (ref 3.5–5.2)
BILIRUBIN TOTAL: 0.4 mg/dL (ref 0.2–1.2)
BUN: 15 mg/dL (ref 6–23)
CALCIUM: 8.9 mg/dL (ref 8.4–10.5)
CO2: 31 meq/L (ref 19–32)
Chloride: 105 mEq/L (ref 96–112)
Creatinine, Ser: 0.76 mg/dL (ref 0.40–1.20)
GFR: 80.22 mL/min (ref >60.00)
Glucose, Bld: 99 mg/dL (ref 70–99)
Potassium: 3.6 mEq/L (ref 3.5–5.1)
Sodium: 140 mEq/L (ref 135–145)
TOTAL PROTEIN: 7.3 g/dL (ref 6.0–8.3)

## 2016-02-08 MED ORDER — CLONIDINE HCL 0.1 MG PO TABS: 0.1000 mg | ORAL_TABLET | Freq: Two times a day (BID) | ORAL | 0 refills | Status: DC

## 2016-02-08 NOTE — Telephone Encounter (Signed)
Kim, I need help figuring out how message got past me. I never saw this on my chart/pt advise/request but accidentally found that e-mail got addressed to me and is in encounter. Also have felt that not been getting my chart messages. Maybe you can help me figure this out.?

## 2016-02-08 NOTE — Telephone Encounter (Signed)
I called pt and sent in clonidine. She ran out of benicar and needs insurance form filled out. I saw her message on Friday after 5 pm. Her bp has been 130/90-140/90 per daughter. She has been using old clonidine rx but only one tab a day. I sent clonidine to her pharmacy. Will work on filling out form on Tuesday and ask our pharmacy advise since some unique questions I am unfamiliar with. If bp increases over weekend then ED evaluation.Daughter agrees with the plan.

## 2016-02-11 ENCOUNTER — Encounter: Payer: Self-pay | Admitting: Medical

## 2016-02-11 NOTE — Telephone Encounter (Signed)
This is another example. I have been looking at lab intermittent all day. Message sent to by pt 02-11-2016. I did not see this. Only way I saw this was when I opened up phone not to send to you guys on this patient. So I need some help quickly.   Also Cynthia Mata pt states she sent some form through my chart for prior authorizaton. Can you print that off?

## 2016-02-12 NOTE — Telephone Encounter (Signed)
I placed a ticket for review.     KP

## 2016-02-12 NOTE — Telephone Encounter (Signed)
Kim thanks for your help.

## 2016-02-13 ENCOUNTER — Telehealth: Payer: Self-pay | Admitting: Endocrinology

## 2016-02-13 MED FILL — JARDIANCE 25 MG TABLET: 25 | 30 days supply | Qty: 30 | Fill #1

## 2016-02-13 NOTE — Telephone Encounter (Signed)
Blood sugar readings from monitor show glucose around 6 PM recently over 250 and after dinner also significantly high, recent fasting readings also getting higher. Please tearful her daughter that if she is taking 8 units of NovoLog before lunch and dinner she needs to increase the dose to 12 units before lunch and dinner and also increase Toujeo from 30 units to 34 units When blood sugars are below 120 in the morning she can reduce Toujeo down to 30 again

## 2016-02-13 NOTE — Telephone Encounter (Signed)
Instructions given to patients son. He voiced understanding and will call back if he needs help or more advise.

## 2016-02-14 ENCOUNTER — Ambulatory Visit: Payer: BLUE CROSS/BLUE SHIELD | Admitting: Endocrinology

## 2016-02-15 ENCOUNTER — Encounter: Payer: Self-pay | Admitting: Medical

## 2016-02-15 ENCOUNTER — Ambulatory Visit (INDEPENDENT_AMBULATORY_CARE_PROVIDER_SITE_OTHER): Payer: BLUE CROSS/BLUE SHIELD | Admitting: Medical

## 2016-02-15 ENCOUNTER — Telehealth: Payer: Self-pay | Admitting: Medical

## 2016-02-15 VITALS — BP 138/72 | HR 58 | Temp 98.1°F | Ht 62.0 in | Wt 175.0 lb

## 2016-02-15 DIAGNOSIS — E785 Hyperlipidemia, unspecified: Secondary | ICD-10-CM | POA: Diagnosis not present

## 2016-02-15 DIAGNOSIS — I10 Essential (primary) hypertension: Secondary | ICD-10-CM

## 2016-02-15 LAB — LIPID PANEL
CHOL/HDL RATIO: 4
Cholesterol: 207 mg/dL — ABNORMAL HIGH (ref 0–200)
HDL: 49.4 mg/dL (ref >39.00)
LDL Cholesterol: 139 mg/dL — ABNORMAL HIGH (ref 0–99)
NONHDL: 157.32
Triglycerides: 90 mg/dL (ref 0.0–149.0)
VLDL: 18 mg/dL (ref 0.0–40.0)

## 2016-02-15 NOTE — Telephone Encounter (Signed)
Patient in today forms completed.

## 2016-02-15 NOTE — Telephone Encounter (Signed)
Form printed and given to provider for completion.

## 2016-02-15 NOTE — Patient Instructions (Addendum)
For your htn will ask you continue the clonidine 0.1 mg twice daily. Keep checking you bp but if it is above 140/90 on regular basis go ahead and increase your clonidine to 0.2 mg twice daily(as discussed). Continue nifedipine.  If you do need to increase clonidine to 0.2 mg twice daily and paperwork on benicar pending then just call me for refill of clonidine.  I did find and print out your form. Will discuss with one of our pharmacist to see if we can get waiver of brand drug additional fees.  For your cholesterol will get lipid panel today.  Follow up in one month or sooner(this will depend if succesful getting benicar cleared)

## 2016-02-15 NOTE — Progress Notes (Signed)
Subjective:    Patient ID: Cynthia Mata, female    DOB: 1945/12/11, 70 y.o.   MRN: [id number]  HPI   Pt in for follow up.  Pt her and her bp is good today. Last night bp was 168/76. But less now. No cardiac or neurologic signs or symptoms.   Pt is on clonidine 0.1 mg twice daily. Also on nifedipine 60 mg one time a day.  We got request on refilling her benicar hct. But insurance has historically denied and required prior auth. New form now to fill out. I am unfamiliar with some portions and need to discuss with pharmacist.  Pt is fasting and will check her lipid panel today.   Review of Systems  Constitutional: Negative for chills, fatigue and fever.  HENT: Negative for congestion and drooling.   Respiratory: Negative for cough, chest tightness, shortness of breath and wheezing.   Cardiovascular: Negative for chest pain and palpitations.  Gastrointestinal: Negative for abdominal pain.  Skin: Negative for rash.  Neurological: Negative for dizziness, speech difficulty, weakness, numbness and headaches.  Hematological: Negative for adenopathy. Does not bruise/bleed easily.  Psychiatric/Behavioral: Negative for behavioral problems, confusion, hallucinations and suicidal ideas. The patient is not nervous/anxious.     Past Medical History:  Diagnosis Date  . Anemia   . Cataract   . Diabetes mellitus without complication (HCC)   . Hyperlipidemia   . Hypertension   . Macular degeneration   . Osteoporosis   . Thyroid disease   . Trapezius strain 10/23/2014     Social History   Social History  . Marital status: Married    Spouse name: N/A  . Number of children: 5  . Years of education: N/A   Occupational History  . Not on file.   Social History Main Topics  . Smoking status: Never Smoker  . Smokeless tobacco: Not on file  . Alcohol use No  . Drug use: No  . Sexual activity: Not on file   Other Topics Concern  . Not on file   Social History  Narrative   Lives with daughter.      Past Surgical History:  Procedure Laterality Date  . ANKLE FRACTURE SURGERY     Right ankle  . CATARACT EXTRACTION    . cataracts  05-2015  . TONSILLECTOMY     Abcess removed also    Family History  Problem Relation Age of Onset  . Hypertension Maternal Aunt   . Heart disease Maternal Aunt   . Diabetes Neg Hx   . COPD Neg Hx   . CAD Maternal Grandfather     No Known Allergies  Current Outpatient Prescriptions on File Prior to Visit  Medication Sig Dispense Refill  . azelastine (ASTELIN) 0.1 % nasal spray Place 2 sprays into both nostrils 2 (two) times daily. Use in each nostril as directed 30 mL 3  . BENICAR HCT 40-12.5 MG tablet Take 1 tablet by mouth daily. 30 tablet 11  . carbamide peroxide (DEBROX) 6.5 % otic solution Place 5 drops into the right ear 2 (two) times daily. May use for 4 days 15 mL 0  . cloNIDine (CATAPRES) 0.1 MG tablet Take 1 tablet (0.1 mg total) by mouth 2 (two) times daily. 14 tablet 0  . empagliflozin (JARDIANCE) 25 MG TABS tablet Take 25 mg by mouth daily. 30 tablet 2  . HYDROcodone-acetaminophen (NORCO/VICODIN) 5-325 MG tablet Take 2 tablets by mouth every 4 (four) hours as needed. 10 tablet 0  .  insulin aspart (NOVOLOG FLEXPEN) 100 UNIT/ML FlexPen Inject 6 units with breakfast and lunch and 8 units with supper 15 mL 2  . Insulin Glargine (TOUJEO SOLOSTAR) 300 UNIT/ML SOPN Inject 34 Units into the skin daily. 15 mL 2  . meclizine (ANTIVERT) 25 MG tablet Take 1 tablet (25 mg total) by mouth 3 (three) times daily as needed for dizziness. 15 tablet 1  . metFORMIN (GLUCOPHAGE) 1000 MG tablet Take 1 tablet (1,000 mg total) by mouth 2 (two) times daily with a meal. 60 tablet 1  . NIFEdipine (NIFEDICAL XL) 60 MG 24 hr tablet Take 1 tablet (60 mg total) by mouth daily. 30 tablet 5  . NOVOFINE 32G X 6 MM MISC USE TO INJECT LEVEMIR AT BEDTIME 100 each 0  . ONE TOUCH ULTRA TEST test strip Use to check blood sugar 3 times per  day dx code E11.9 100 each 3  . polyethylene glycol powder (MIRALAX) powder Take 17 g by mouth daily. Mix in 4 - 8 oz of liquid. 255 g 0   No current facility-administered medications on file prior to visit.     BP 138/72 Comment: 156/80. My check. ESpac.  Pulse (!) 58   Temp 98.1 F (36.7 C) (Oral)   Ht 5\' 2"  (1.575 m)   Wt 175 lb (79.4 kg)   SpO2 98%   BMI 32.01 kg/m       Objective:   Physical Exam  General Mental Status- Alert. General Appearance- Not in acute distress.   Skin General: Color- Normal Color. Moisture- Normal Moisture.  Neck Carotid Arteries- Normal color. Moisture- Normal Moisture. No carotid bruits. No JVD.  Chest and Lung Exam Auscultation: Breath Sounds:-Normal.  Cardiovascular Auscultation:Rythm- Regular. Murmurs & Other Heart Sounds:Auscultation of the heart reveals- No Murmurs.  Abdomen Inspection:-Inspeection Normal. Palpation/Percussion:Note:No mass. Palpation and Percussion of the abdomen reveal- Non Tender, Non Distended + BS, no rebound or guarding.  Neurologic Cranial Nerve exam:- CN III-XII intact(No nystagmus), symmetric smile. Strength:- 5/5 equal and symmetric strength both upper and lower extremities.      Assessment & Plan:  For your htn will ask you continue the clonidine 0.1 mg twice daily. Keep checking you bp but if it is above 140/90 on regular basis go ahead and increase your clonidine to 0.2 mg twice daily(as discussed). Continue nifedipine.  I did find and print out your form. Will discuss with one of our pharmacist to see if we can get waiver of brand drug additional fees.  For your cholesterol will get lipid panel today.  Follow up in one month or sooner(this will depend if succesful getting benicar cleared)  Burnie Therien, Ramon DredgeEdward, PA-C

## 2016-02-17 NOTE — Telephone Encounter (Signed)
Opened inadvertent.

## 2016-02-18 ENCOUNTER — Telehealth: Payer: Self-pay | Admitting: Medical

## 2016-02-18 MED ORDER — ATORVASTATIN CALCIUM 10 MG PO TABS: 10.0000 mg | ORAL_TABLET | Freq: Every day | ORAL | 2 refills | Status: DC

## 2016-02-18 MED FILL — TOUJEO SOLOSTAR 300 UNITS/M: 300 | 27 days supply | Qty: 3 | Fill #1

## 2016-02-18 MED FILL — BRIMONIDINE 0.2% EYE DROP: 0.2 | 25 days supply | Qty: 5 | Fill #3

## 2016-02-18 MED FILL — TIMOLOL 0.5% EYE DROPS: 0.5 | 50 days supply | Qty: 10 | Fill #2

## 2016-02-18 NOTE — Telephone Encounter (Signed)
Let pt know with diabetes and htn and elevated lipid panel I do think she needs to get back on atorvastatin. Sent in rx. Repeat lipid panel in 3 months fasting.

## 2016-02-19 MED FILL — ATORVASTATIN 10 MG TABLET: 10 | 30 days supply | Qty: 30 | Fill #0

## 2016-02-19 NOTE — Telephone Encounter (Signed)
I filled out request for waiver of brand drug additional fees form. I will touch base with [facility] tomorrow before [facility]. Our pharmacy was helping her get brand benciar/hct in the past.   Form is is filled out blue folder. Remind me to call [employer] or [employer] [facility]  Tomorrow.

## 2016-02-20 NOTE — Telephone Encounter (Signed)
Faxed Waiver request to [email or web address].

## 2016-02-21 ENCOUNTER — Encounter: Payer: Self-pay | Admitting: Medical

## 2016-02-21 ENCOUNTER — Other Ambulatory Visit: Payer: Self-pay | Admitting: Medical

## 2016-02-22 ENCOUNTER — Other Ambulatory Visit: Payer: Self-pay | Admitting: Medical

## 2016-02-26 ENCOUNTER — Telehealth: Payer: Self-pay

## 2016-02-27 MED ORDER — CLONIDINE HCL 0.1 MG PO TABS: 0.1000 mg | ORAL_TABLET | Freq: Two times a day (BID) | ORAL | 1 refills | Status: DC

## 2016-02-27 MED FILL — cloNIDine HCL 0.1 MG TABS: 0.1 | 14 days supply | Qty: 28 | Fill #0

## 2016-02-27 NOTE — Telephone Encounter (Signed)
Waiver sent to [phone] [facility]

## 2016-02-28 NOTE — Telephone Encounter (Signed)
Do you know what is status of her benicar prior authorization. Who is working on those now that Shanda BumpsJessica is not here.

## 2016-03-01 NOTE — Telephone Encounter (Signed)
Paper work on benicar is still pending. Will you call patient and schedule appointment Tuesday am for blood pressure check. Thanks. Want to make sure bp controlled since not on her benicar. And temporarily using clonidine. This has worked but want to verify. Also will you check on status of prior authorization.

## 2016-03-02 IMAGING — DX DG SHOULDER 2+V*R*
3 series · 3 of 3 positions shown · non-contrast
Comparison: None.

CLINICAL DATA: Right shoulder pain, no known injury, initial
encounter

EXAM:
RIGHT SHOULDER - 2+ VIEW

[shoulder grashey]
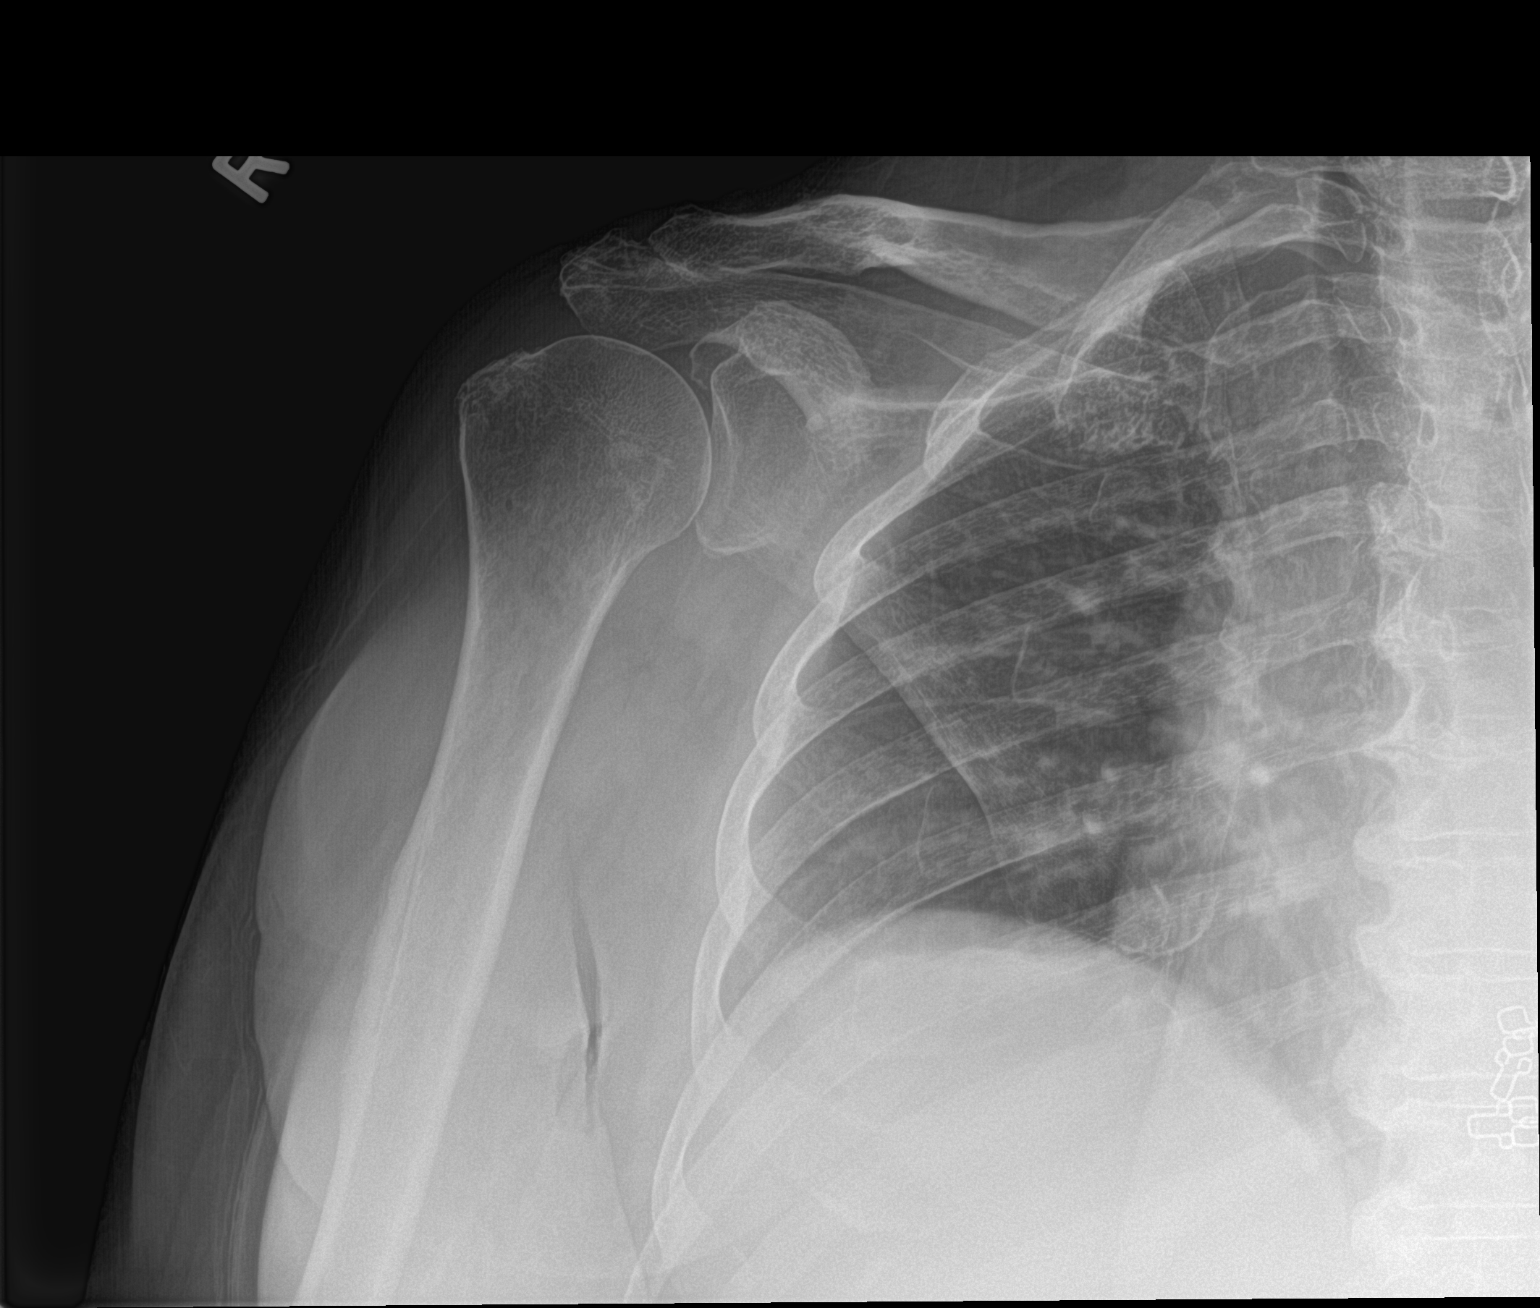

[shoulder y view]
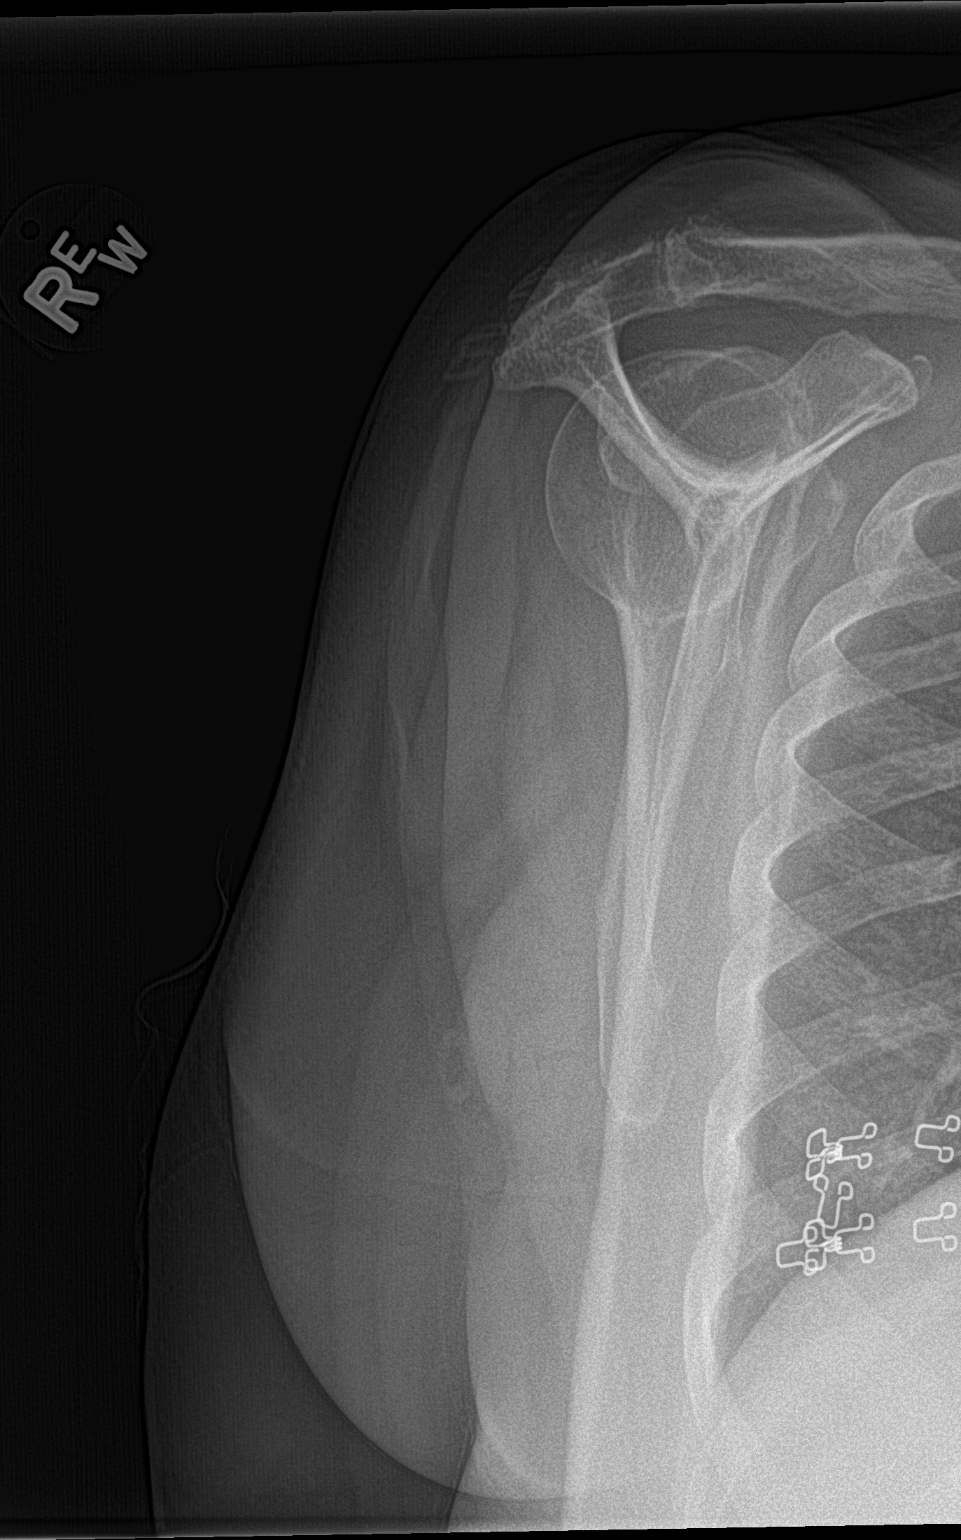

[shoulder axillary]
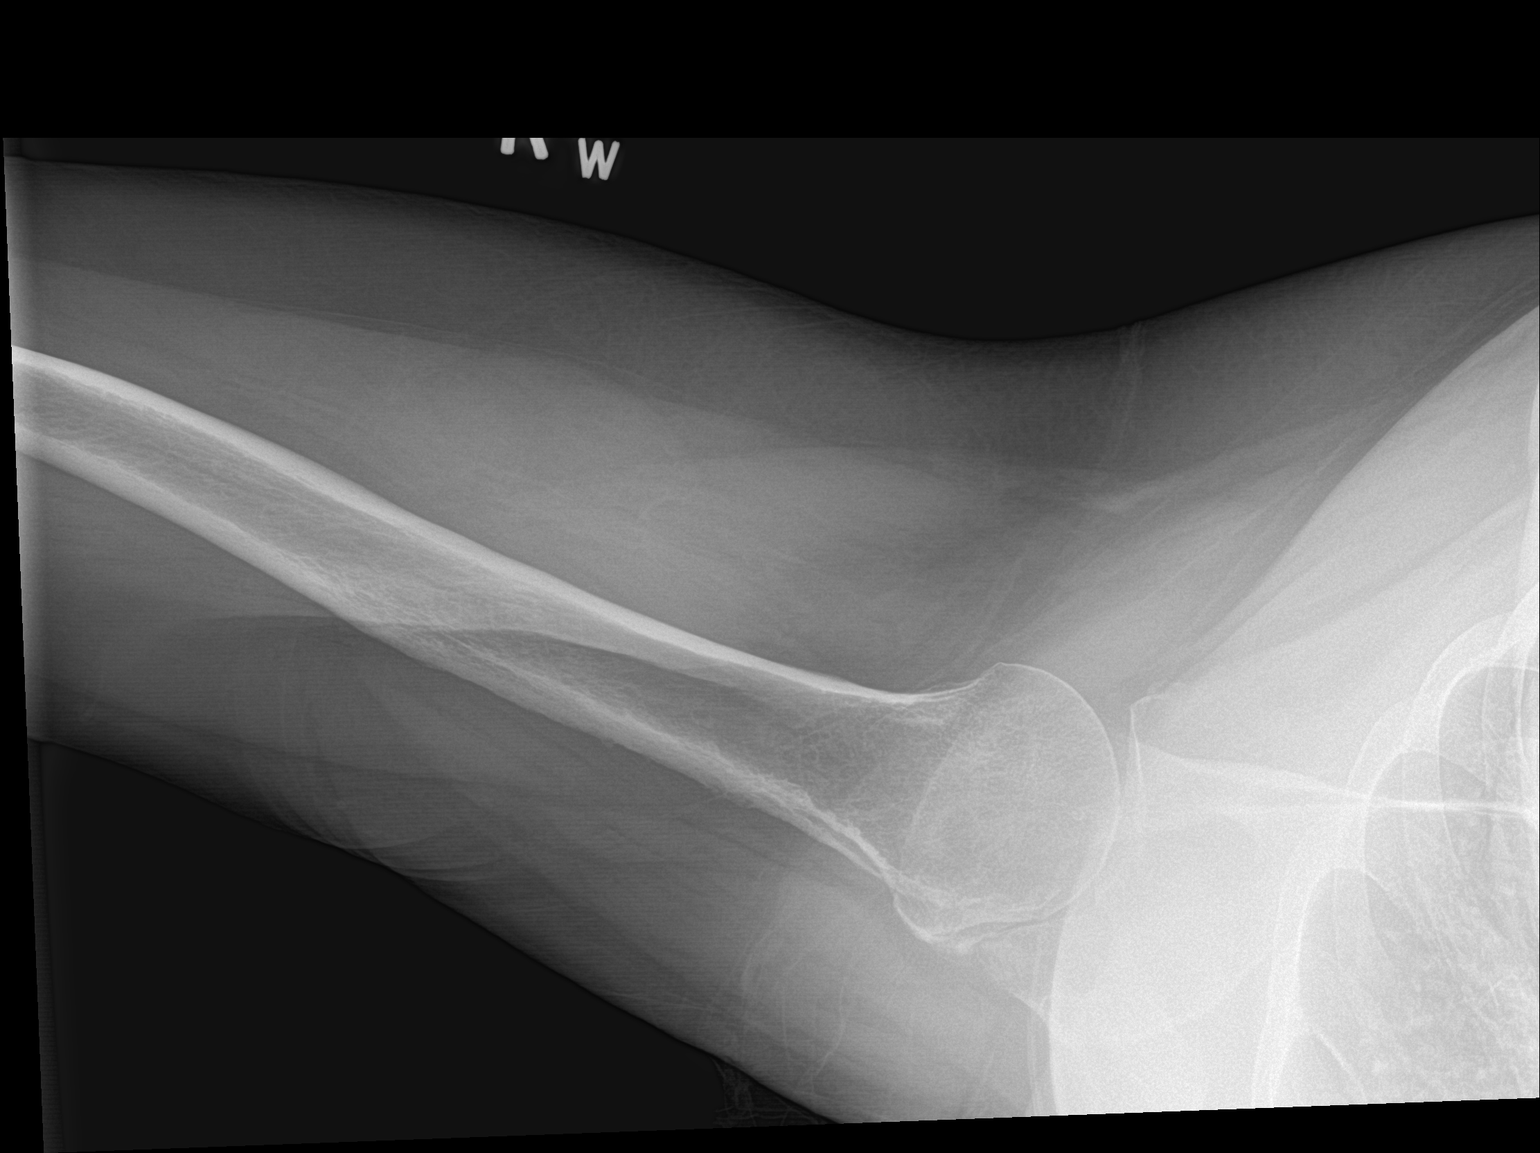

[3 of 3 positions shown; findings below may reference images not displayed]

FINDINGS: Mild degenerative changes of the acromioclavicular and glenohumeral
joints are seen. No acute fracture or dislocation is noted. No gross
soft tissue abnormality is seen.
IMPRESSION: Degenerative changes without acute abnormality.

## 2016-03-05 MED FILL — NOVOLOG FLEXPEN SYRINGE: 100 | 75 days supply | Qty: 15 | Fill #1

## 2016-03-08 NOTE — Telephone Encounter (Signed)
What is status on her benicar paperwork authorization. Will you call pt and verify her bp is still controlled.

## 2016-03-11 ENCOUNTER — Other Ambulatory Visit: Payer: Self-pay

## 2016-03-11 MED ORDER — BENICAR HCT 40-12.5 MG PO TABS: 1.0000 | ORAL_TABLET | Freq: Every day | ORAL | 11 refills | Status: DC

## 2016-03-11 MED FILL — OLMESARTAN-HCTZ 40-12.5 MG: 40-12.5 | 30 days supply | Qty: 30 | Fill #0

## 2016-03-11 NOTE — Telephone Encounter (Signed)
Refaxed patients Benicar to see if it will go through insurance.

## 2016-03-12 MED FILL — NIFEdipine ER 60 MG TB24: 60 | 30 days supply | Qty: 30 | Fill #2

## 2016-03-13 NOTE — Telephone Encounter (Signed)
Calld patient left message for return call regarding Benicar,

## 2016-03-14 ENCOUNTER — Encounter: Payer: Self-pay | Admitting: Medical

## 2016-03-14 ENCOUNTER — Ambulatory Visit (INDEPENDENT_AMBULATORY_CARE_PROVIDER_SITE_OTHER): Payer: BLUE CROSS/BLUE SHIELD | Admitting: Medical

## 2016-03-14 VITALS — BP 130/70 | HR 77 | Temp 98.3°F | Ht 62.0 in | Wt 177.0 lb

## 2016-03-14 DIAGNOSIS — I10 Essential (primary) hypertension: Secondary | ICD-10-CM | POA: Diagnosis not present

## 2016-03-14 DIAGNOSIS — E785 Hyperlipidemia, unspecified: Secondary | ICD-10-CM

## 2016-03-14 MED ORDER — CLONIDINE HCL 0.1 MG PO TABS: 0.1000 mg | ORAL_TABLET | Freq: Two times a day (BID) | ORAL | 3 refills | Status: DC

## 2016-03-14 MED ORDER — HYDROCHLOROTHIAZIDE 12.5 MG PO CAPS: 12.5000 mg | ORAL_CAPSULE | Freq: Every day | ORAL | 3 refills | Status: DC

## 2016-03-14 MED ORDER — NIFEDIPINE ER OSMOTIC RELEASE 60 MG PO TB24: 60.0000 mg | ORAL_TABLET | Freq: Every day | ORAL | 5 refills | Status: DC

## 2016-03-14 MED FILL — HYDROCHLOROTHIAZIDE 12.5 MG: 12.5 | 30 days supply | Qty: 30 | Fill #0

## 2016-03-14 MED FILL — cloNIDine HCL 0.1 MG TABS: 0.1 | 15 days supply | Qty: 30 | Fill #0

## 2016-03-14 NOTE — Progress Notes (Signed)
Subjective:    Patient ID: Cynthia Mata, female    DOB: 1946/05/20, 70 y.o.   MRN: [id number]  HPI   Pt in for follow up.   Pt daughter states benicar was denied by her insurance. Per daughter her insurance stated she needed to have allergic reaction history documented with generic meds before would fill brand. Pt had tried generic with no allergic reaction but she states it did not work.   Pt is on nifedipine xl and catapres. Catapres was given while we tried to get benicar/hctz authorized.  Pt bp looks good now. Sometimes at night has bp elevation up to 180. But range at night 160-180. Pt sometimes takes 2nd clonidine around 9pm-10 pm. 1st tab at 7 am. She will take her bp right before taking her night time clonidine dose.   Pt wants to delay flu vaccine. Pt has event this Sunday and does not want to feel mild ill.    Pt indicates tried losartan and lisinopril in past and did not help.  Pt restarted her lipitor in sept since bp was elevated.   Review of Systems  Constitutional: Negative for chills, fatigue and fever.  HENT: Negative for congestion.   Respiratory: Negative for cough, chest tightness and wheezing.   Cardiovascular: Negative for chest pain and palpitations.  Gastrointestinal: Negative for abdominal pain, blood in stool and constipation.  Musculoskeletal: Negative for back pain.  Skin: Negative for rash.  Neurological: Negative for dizziness, seizures, weakness, numbness and headaches.  Hematological: Negative for adenopathy. Does not bruise/bleed easily.  Psychiatric/Behavioral: Negative for behavioral problems and confusion.    Past Medical History:  Diagnosis Date  . Anemia   . Cataract   . Diabetes mellitus without complication (HCC)   . Hyperlipidemia   . Hypertension   . Macular degeneration   . Osteoporosis   . Thyroid disease   . Trapezius strain 10/23/2014     Social History   Social History  . Marital status: Married   Spouse name: N/A  . Number of children: 5  . Years of education: N/A   Occupational History  . Not on file.   Social History Main Topics  . Smoking status: Never Smoker  . Smokeless tobacco: Not on file  . Alcohol use No  . Drug use: No  . Sexual activity: Not on file   Other Topics Concern  . Not on file   Social History Narrative   Lives with daughter.      Past Surgical History:  Procedure Laterality Date  . ANKLE FRACTURE SURGERY     Right ankle  . CATARACT EXTRACTION    . cataracts  05-2015  . TONSILLECTOMY     Abcess removed also    Family History  Problem Relation Age of Onset  . Hypertension Maternal Aunt   . Heart disease Maternal Aunt   . Diabetes Neg Hx   . COPD Neg Hx   . CAD Maternal Grandfather     No Known Allergies  Current Outpatient Prescriptions on File Prior to Visit  Medication Sig Dispense Refill  . atorvastatin (LIPITOR) 10 MG tablet Take 1 tablet (10 mg total) by mouth daily. 30 tablet 2  . azelastine (ASTELIN) 0.1 % nasal spray Place 2 sprays into both nostrils 2 (two) times daily. Use in each nostril as directed 30 mL 3  . cloNIDine (CATAPRES) 0.1 MG tablet Take 1 tablet (0.1 mg total) by mouth 2 (two) times daily. 28 tablet 1  .  empagliflozin (JARDIANCE) 25 MG TABS tablet Take 25 mg by mouth daily. 30 tablet 2  . insulin aspart (NOVOLOG FLEXPEN) 100 UNIT/ML FlexPen Inject 6 units with breakfast and lunch and 8 units with supper 15 mL 2  . Insulin Glargine (TOUJEO SOLOSTAR) 300 UNIT/ML SOPN Inject 34 Units into the skin daily. 15 mL 2  . metFORMIN (GLUCOPHAGE) 1000 MG tablet Take 1 tablet (1,000 mg total) by mouth 2 (two) times daily with a meal. 60 tablet 1  . NIFEdipine (NIFEDICAL XL) 60 MG 24 hr tablet Take 1 tablet (60 mg total) by mouth daily. 30 tablet 5  . NOVOFINE 32G X 6 MM MISC USE TO INJECT LEVEMIR AT BEDTIME 100 each 0  . ONE TOUCH ULTRA TEST test strip Use to check blood sugar 3 times per day dx code E11.9 100 each 3  .  polyethylene glycol powder (MIRALAX) powder Take 17 g by mouth daily. Mix in 4 - 8 oz of liquid. 255 g 0  . BENICAR HCT 40-12.5 MG tablet Take 1 tablet by mouth daily. (Patient not taking: Reported on 03/14/2016) 30 tablet 11  . carbamide peroxide (DEBROX) 6.5 % otic solution Place 5 drops into the right ear 2 (two) times daily. May use for 4 days (Patient not taking: Reported on 03/14/2016) 15 mL 0  . HYDROcodone-acetaminophen (NORCO/VICODIN) 5-325 MG tablet Take 2 tablets by mouth every 4 (four) hours as needed. (Patient not taking: Reported on 03/14/2016) 10 tablet 0  . meclizine (ANTIVERT) 25 MG tablet Take 1 tablet (25 mg total) by mouth 3 (three) times daily as needed for dizziness. (Patient not taking: Reported on 03/14/2016) 15 tablet 1   No current facility-administered medications on file prior to visit.     BP 130/70   Pulse 77   Temp 98.3 F (36.8 C) (Other (Comment))   Ht 5\' 2"  (1.575 m)   Wt 177 lb (80.3 kg)   SpO2 98%   BMI 32.37 kg/m       Objective:   Physical Exam  General Mental Status- Alert. General Appearance- Not in acute distress.   Skin General: Color- Normal Color. Moisture- Normal Moisture.  Neck Carotid Arteries- Normal color. Moisture- Normal Moisture. No carotid bruits. No JVD.  Chest and Lung Exam Auscultation: Breath Sounds:-Normal.  Cardiovascular Auscultation:Rythm- Regular. Murmurs & Other Heart Sounds:Auscultation of the heart reveals- No Murmurs.  Abdomen Inspection:-Inspeection Normal. Palpation/Percussion:Note:No mass. Palpation and Percussion of the abdomen reveal- Non Tender, Non Distended + BS, no rebound or guarding.    Neurologic Cranial Nerve exam:- CN III-XII intact(No nystagmus), symmetric smile. Strength:- 5/5 equal and symmetric strength both upper and lower extremities.      Assessment & Plan:  Your bp has been controlled most of time with clonidine and nifedipine. Your elevation of bp at night may be rebound htn with  delay of second dose of clonidine. I want you to take clonidine 7 am and 7 pm. Check bp 8:30-9:00 pm. If bp still elevated over 140/90 then will add low dose hctz. Continue the nifedipine.  For you cholesterol continue the lipitor.   Follow up May 20, 2016 for bp check and fasting lipid panel.   Follow up before then if your bp is not consistently below 140/90. If hctz is needed then ask you eat potassium rich foods.  You could my chart me you night time bp levels as well. So we can confirm bp is controlled.  Duriel Deery, Ramon DredgeEdward, PA-C

## 2016-03-14 NOTE — Patient Instructions (Signed)
Your bp has been controlled most of time with clonidine and nifedipine. Your elevation of bp at night may be rebound htn with delay of second dose of clonidine. I want you to take clonidine 7 am and 7 pm. Check bp 8:30-9:00 pm. If bp still elevated over 140/90 then will add low dose hctz. Continue the nifedipine.  For you cholesterol continue the lipitor.   Follow up May 20, 2016 for bp check and fasting lipid panel.   Follow up before then if your bp is not consistently below 140/90. If hctz is needed then ask you eat potassium rich foods.  You could my chart me you night time bp levels as well. So we can confirm bp is controlled.

## 2016-03-14 NOTE — Progress Notes (Signed)
Pre visit review using our clinic tool,if applicable. No additional management support is needed unless otherwise documented below in the visit note.  

## 2016-03-21 ENCOUNTER — Other Ambulatory Visit: Payer: Self-pay | Admitting: Endocrinology

## 2016-03-21 ENCOUNTER — Ambulatory Visit (INDEPENDENT_AMBULATORY_CARE_PROVIDER_SITE_OTHER): Payer: BLUE CROSS/BLUE SHIELD | Admitting: *Deleted

## 2016-03-21 DIAGNOSIS — Z23 Encounter for immunization: Secondary | ICD-10-CM

## 2016-03-21 MED FILL — NOVOFINE 32G NEEDLES: 32G X 6 MM | 90 days supply | Qty: 100 | Fill #0

## 2016-03-21 MED FILL — TOUJEO SOLOSTAR 300 UNITS/M: 300 | 27 days supply | Qty: 3 | Fill #2

## 2016-03-26 NOTE — Telephone Encounter (Signed)
Patient has been in office since this message.

## 2016-04-01 MED FILL — BRIMONIDINE 0.2% EYE DROP: 0.2 | 25 days supply | Qty: 5 | Fill #4

## 2016-04-01 MED FILL — TIMOLOL 0.5% EYE DROPS: 0.5 | 50 days supply | Qty: 10 | Fill #3

## 2016-04-08 ENCOUNTER — Other Ambulatory Visit: Payer: BLUE CROSS/BLUE SHIELD

## 2016-04-11 ENCOUNTER — Other Ambulatory Visit (INDEPENDENT_AMBULATORY_CARE_PROVIDER_SITE_OTHER): Payer: BLUE CROSS/BLUE SHIELD

## 2016-04-11 ENCOUNTER — Ambulatory Visit: Payer: BLUE CROSS/BLUE SHIELD | Admitting: Endocrinology

## 2016-04-11 DIAGNOSIS — Z794 Long term (current) use of insulin: Secondary | ICD-10-CM | POA: Diagnosis not present

## 2016-04-11 DIAGNOSIS — E1165 Type 2 diabetes mellitus with hyperglycemia: Secondary | ICD-10-CM | POA: Diagnosis not present

## 2016-04-11 LAB — BASIC METABOLIC PANEL
BUN: 21 mg/dL (ref 6–23)
CHLORIDE: 105 meq/L (ref 96–112)
CO2: 31 mEq/L (ref 19–32)
Calcium: 9.3 mg/dL (ref 8.4–10.5)
Creatinine, Ser: 0.84 mg/dL (ref 0.40–1.20)
GFR: 71.43 mL/min (ref >60.00)
Glucose, Bld: 122 mg/dL — ABNORMAL HIGH (ref 70–99)
POTASSIUM: 4 meq/L (ref 3.5–5.1)
Sodium: 140 mEq/L (ref 135–145)

## 2016-04-11 LAB — MICROALBUMIN / CREATININE URINE RATIO
CREATININE, U: 38.8 mg/dL
MICROALB UR: 2.4 mg/dL — AB (ref 0.0–1.9)
MICROALB/CREAT RATIO: 6.2 mg/g (ref 0.0–30.0)

## 2016-04-11 LAB — HEMOGLOBIN A1C: HEMOGLOBIN A1C: 7.8 % — AB (ref 4.6–6.5)

## 2016-04-11 MED FILL — OLMESARTAN-HCTZ 40-12.5 MG: 40-12.5 | 30 days supply | Qty: 30 | Fill #1

## 2016-04-15 ENCOUNTER — Encounter: Payer: Self-pay | Admitting: Endocrinology

## 2016-04-15 ENCOUNTER — Ambulatory Visit (INDEPENDENT_AMBULATORY_CARE_PROVIDER_SITE_OTHER): Payer: BLUE CROSS/BLUE SHIELD | Admitting: Endocrinology

## 2016-04-15 VITALS — BP 126/70 | HR 79 | Ht 62.0 in | Wt 183.0 lb

## 2016-04-15 DIAGNOSIS — E782 Mixed hyperlipidemia: Secondary | ICD-10-CM

## 2016-04-15 DIAGNOSIS — E1165 Type 2 diabetes mellitus with hyperglycemia: Secondary | ICD-10-CM | POA: Diagnosis not present

## 2016-04-15 DIAGNOSIS — Z794 Long term (current) use of insulin: Secondary | ICD-10-CM | POA: Diagnosis not present

## 2016-04-15 MED ORDER — METFORMIN HCL ER 500 MG PO TB24: 1500.0000 mg | ORAL_TABLET | Freq: Every day | ORAL | 3 refills | Status: DC

## 2016-04-15 NOTE — Progress Notes (Signed)
Patient ID: Cynthia Mata, female   DOB: [birth year], 70 y.o.   MRN: [id number]           Reason for Appointment: Follow-up for Type 2 Diabetes  Referring physician: Saguier  History of Present Illness:          Diagnosis: Type 2 diabetes mellitus, date of diagnosis: 2001        Past history: At the time of diagnosis she had symptoms of increased thirst and urination and very high blood sugars, was obese.   She was [place] with oral medications possibly glipizide and metformin She also changed her diet and was able to lose weight  However she does not think her blood sugars had been consistently controlled In 2012 she was given Levemir insulin but not clear if this helped her sugar control as her A1c was still 9.5 in 2014 She was apparently continued on Metaglip also which he had been taking for a while; she has usually taken only 1 tablet twice a day of the 5/500 tablets.  Has not had any abdominal pain or diarrhea with metformin previously  Recent history:   INSULIN regimen is described as:Toujeo 34 in am. Novolog 6-6-8 units daily Oral hypoglycemic drugs the patient is taking are: Jardiance 25 mg daily, Metformin 2 g   Her A1c previously was 9.1 and is now down to 7.8    Current management, blood sugar patterns and problems:  Her fasting blood sugars are mostly in the low 100s with some variability  She has overall a progressive increase in blood sugars are morning to evening  Although blood sugars on an average of fairly good at lunchtime they can be high at times  Blood sugars are mostly high before supper time  She was told to increase her insulin to up to 12 units at least at suppertime on her last visit but she did not do so  She thinks that some of her high readings are related to eating sweets like cookies and cakes at lunch or sometimes between breakfast and lunch  She forgets to check her blood sugars after supper as directed  In  the last several days she thinks she was having some migrating upper abdominal discomfort and she stopped taking her metformin and Jardiance and she thinks symptoms are better now  No hypoglycemia reported           Side effects from medications have been: None  Compliance with the medical regimen: Fair Hypoglycemia: none   Glucose monitoring:  done 2-3 times a day         Glucometer: One Touch.      Blood Glucose readings by monitor download:  Mean values apply above for all meters except median for One Touch  PRE-MEAL Fasting Lunch Dinner Bedtime Overall  Glucose range: 92-225  102-264  135-377  143-301    Mean/median: 144  156   228   173    Self-care: The diet that the patient has been following is: tries to limit Sweets, carbs and fats and more vegetables .     Meals: 3 meals per day. Breakfast is eggs, beans and/or bread  .   Has chicken and vegetables for lunch and dinner with some tortillas.  Has bread and nuts for snacks    Dinner is at 6-7 pm          Exercise:  walking 3-4 times a week, Recently up to one hour, mostly in  the evenings        Dietician visit, most recent: Never .               Weight history:  Wt Readings from Last 3 Encounters:  04/15/16 183 lb (83 kg)  03/14/16 177 lb (80.3 kg)  02/15/16 175 lb (79.4 kg)    Glycemic control:   Lab Results  Component Value Date   HGBA1C 7.8 (H) 04/11/2016   HGBA1C 9.1 12/24/2015   HGBA1C 8.7 (H) 07/16/2015   Lab Results  Component Value Date   MICROALBUR 2.4 (H) 04/11/2016   LDLCALC 139 (H) 02/15/2016   CREATININE 0.84 04/11/2016         Medication List       Accurate as of 04/15/16  8:34 PM. Always use your most recent med list.          atorvastatin 10 MG tablet Commonly known as:  LIPITOR Take 1 tablet (10 mg total) by mouth daily.   azelastine 0.1 % nasal spray Commonly known as:  ASTELIN Place 2 sprays into both nostrils 2 (two) times daily. Use in each nostril as directed   BENICAR  HCT 40-12.5 MG tablet Generic drug:  olmesartan-hydrochlorothiazide Take 1 tablet by mouth daily.   carbamide peroxide 6.5 % otic solution Commonly known as:  DEBROX Place 5 drops into the right ear 2 (two) times daily. May use for 4 days   cloNIDine 0.1 MG tablet Commonly known as:  CATAPRES Take 1 tablet (0.1 mg total) by mouth 2 (two) times daily.   empagliflozin 25 MG Tabs tablet Commonly known as:  JARDIANCE Take 25 mg by mouth daily.   hydrochlorothiazide 12.5 MG capsule Commonly known as:  MICROZIDE Take 1 capsule (12.5 mg total) by mouth daily.   HYDROcodone-acetaminophen 5-325 MG tablet Commonly known as:  NORCO/VICODIN Take 2 tablets by mouth every 4 (four) hours as needed.   insulin aspart 100 UNIT/ML FlexPen Commonly known as:  NOVOLOG FLEXPEN Inject 6 units with breakfast and lunch and 8 units with supper   Insulin Glargine 300 UNIT/ML Sopn Commonly known as:  TOUJEO SOLOSTAR Inject 34 Units into the skin daily.   meclizine 25 MG tablet Commonly known as:  ANTIVERT Take 1 tablet (25 mg total) by mouth 3 (three) times daily as needed for dizziness.   metFORMIN 500 MG 24 hr tablet Commonly known as:  GLUCOPHAGE-XR Take 3 tablets (1,500 mg total) by mouth daily with supper.   NIFEdipine 60 MG 24 hr tablet Commonly known as:  NIFEDICAL XL Take 1 tablet (60 mg total) by mouth daily.   NOVOFINE 32G X 6 MM Misc Generic drug:  Insulin Pen Needle USE TO INJECT TOUJEO ONCE DAILY   ONE TOUCH ULTRA TEST test strip Generic drug:  glucose blood Use to check blood sugar 3 times per day dx code E11.9   polyethylene glycol powder powder Commonly known as:  MIRALAX Take 17 g by mouth daily. Mix in 4 - 8 oz of liquid.       Allergies: No Known Allergies  Past Medical History:  Diagnosis Date  . Anemia   . Cataract   . Diabetes mellitus without complication (HCC)   . Hyperlipidemia   . Hypertension   . Macular degeneration   . Osteoporosis   . Thyroid  disease   . Trapezius strain 10/23/2014    Past Surgical History:  Procedure Laterality Date  . ANKLE FRACTURE SURGERY     Right ankle  . CATARACT  EXTRACTION    . cataracts  05-2015  . TONSILLECTOMY     Abcess removed also    Family History  Problem Relation Age of Onset  . Hypertension Maternal Aunt   . Heart disease Maternal Aunt   . Diabetes Neg Hx   . COPD Neg Hx   . CAD Maternal Grandfather     Social History:  reports that she has never smoked. She does not have any smokeless tobacco history on file. She reports that she does not drink alcohol or use drugs.    Review of Systems    Most recent eye exam was In 09/2015       Lipids: On  Rx with Lipitor now, recently prescribed by PCP       Lab Results  Component Value Date   CHOL 207 (H) 02/15/2016   HDL 49.40 02/15/2016   LDLCALC 139 (H) 02/15/2016   TRIG 90.0 02/15/2016   CHOLHDL 4 02/15/2016                  Thyroid: Currently not on any supplements and TSH is  normal   Lab Results  Component Value Date   TSH 3.82 11/19/2015   TSH 4.753 (H) 08/11/2014   TSH 3.74 06/29/2014       The blood pressure has been high for 15 years treated with multiple medications by PCP   LABS:  Lab on 04/11/2016  Component Date Value Ref Range Status  . Hgb A1c MFr Bld 04/11/2016 7.8* 4.6 - 6.5 % Final  . Sodium 04/11/2016 140  135 - 145 mEq/L Final  . Potassium 04/11/2016 4.0  3.5 - 5.1 mEq/L Final  . Chloride 04/11/2016 105  96 - 112 mEq/L Final  . CO2 04/11/2016 31  19 - 32 mEq/L Final  . Glucose, Bld 04/11/2016 122* 70 - 99 mg/dL Final  . BUN 16/03/9603 21  6 - 23 mg/dL Final  . Creatinine, Ser 04/11/2016 0.84  0.40 - 1.20 mg/dL Final  . Calcium 54/02/8118 9.3  8.4 - 10.5 mg/dL Final  . GFR 14/78/2956 71.43  >60.00 mL/min Final  . Microalb, Ur 04/11/2016 2.4* 0.0 - 1.9 mg/dL Final  . Creatinine,U 21/30/8657 38.8  mg/dL Final  . Microalb Creat Ratio 04/11/2016 6.2  0.0 - 30.0 mg/g Final    Physical  Examination:  BP 126/70   Pulse 79   Ht 5\' 2"  (1.575 m)   Wt 183 lb (83 kg)   SpO2 96%   BMI 33.47 kg/m      ASSESSMENT/PLAN:  Diabetes type 2, uncontrolled with obesity and BMI 34  See history of present illness for current blood sugar patterns, problems identified and current management  A1c is improved at 7.8 She continues to have difficulty controlling postprandial hyperglycemia This is partly related to inadequate insulin at lunch and possibly after supper but also due to inconsistent diet and eating more carbohydrate and sweets. Has not checked enough readings after supper Blood sugars are relatively good fasting with current regimen of 34 units of Toujeo   Recommendations:  She will need to work with the dietitian to help plan her meals better and find lower glycemic and index alternatives  Increase lunchtime dose to at least 8 units and may take 10 units if planning to eat dessert.  If her sugars after evening meal are also high she will need to take 10 units to cover the evening meal.  Restart Jardiance  Trial of metformin ER instead of regular  metformin, she can take 500 mg in the morning and 1000 at dinnertime.  May reduce frequency of testing in the morning    Needs follow-up lipids  Patient Instructions  Check blood sugars on waking up 4x per week  Also check blood sugars about 2 hours after a meal and do this after different meals by rotation  Recommended blood sugar levels on waking up is 90-130 and about 2 hours after meal is 130-160  Please bring your blood sugar monitor to each visit, thank you  Metformin new 1 am and 2 dinner    Counseling time on subjects discussed above is over 50% of today's 25 minute visit   Maya Scholer 04/15/2016, 8:34 PM   Note: This office note was prepared with Dragon voice recognition system technology. Any transcriptional errors that result from this process are unintentional.

## 2016-04-15 NOTE — Patient Instructions (Signed)
Check blood sugars on waking up 4x per week  Also check blood sugars about 2 hours after a meal and do this after different meals by rotation  Recommended blood sugar levels on waking up is 90-130 and about 2 hours after meal is 130-160  Please bring your blood sugar monitor to each visit, thank you  Metformin new 1 am and 2 dinner

## 2016-04-18 MED FILL — NIFEdipine ER 60 MG TB24: 60 | 30 days supply | Qty: 30 | Fill #3

## 2016-04-18 MED FILL — TOUJEO SOLOSTAR 300 UNITS/M: 300 | 27 days supply | Qty: 3 | Fill #3

## 2016-04-30 ENCOUNTER — Encounter: Payer: Self-pay | Admitting: Medical

## 2016-04-30 ENCOUNTER — Ambulatory Visit (INDEPENDENT_AMBULATORY_CARE_PROVIDER_SITE_OTHER): Payer: BLUE CROSS/BLUE SHIELD | Admitting: Medical

## 2016-04-30 VITALS — BP 142/80 | HR 67 | Temp 98.2°F | Ht 62.0 in | Wt 179.6 lb

## 2016-04-30 DIAGNOSIS — I10 Essential (primary) hypertension: Secondary | ICD-10-CM

## 2016-04-30 NOTE — Progress Notes (Signed)
Subjective:    Patient ID: Cynthia Mata, female    DOB: 10/04/1945, 70 y.o.   MRN: [id number]  HPI  Pt in for follow up.   She state her bp is high in the morning and at night.  Pt got generic benicar recently since insurance company. She is on benicar 40/12.5 mg a day. In the past I had given clonidine 0.1 twice a day while  trying to get a prior authorization(but name brand benicar was denied). So recently has been using clonidine at night. 175 systolic at night and 160 systolic in the am. Pt is still on the nifedipine as well.   No cardiac or neurologic signs or symptoms    Review of Systems  Constitutional: Negative for chills, fatigue and fever.  Respiratory: Negative for cough, chest tightness, shortness of breath and wheezing.   Cardiovascular: Negative for chest pain and palpitations.  Gastrointestinal: Negative for abdominal pain.  Musculoskeletal: Negative for back pain.  Skin: Negative for rash.  Neurological: Negative for dizziness, light-headedness and headaches.  Hematological: Negative for adenopathy. Does not bruise/bleed easily.  Psychiatric/Behavioral: Negative for behavioral problems and confusion. The patient is not nervous/anxious.     Past Medical History:  Diagnosis Date  . Anemia   . Cataract   . Diabetes mellitus without complication (HCC)   . Hyperlipidemia   . Hypertension   . Macular degeneration   . Osteoporosis   . Thyroid disease   . Trapezius strain 10/23/2014     Social History   Social History  . Marital status: Married    Spouse name: N/A  . Number of children: 5  . Years of education: N/A   Occupational History  . Not on file.   Social History Main Topics  . Smoking status: Never Smoker  . Smokeless tobacco: Not on file  . Alcohol use No  . Drug use: No  . Sexual activity: Not on file   Other Topics Concern  . Not on file   Social History Narrative   Lives with daughter.      Past Surgical History:    Procedure Laterality Date  . ANKLE FRACTURE SURGERY     Right ankle  . CATARACT EXTRACTION    . cataracts  05-2015  . TONSILLECTOMY     Abcess removed also    Family History  Problem Relation Age of Onset  . Hypertension Maternal Aunt   . Heart disease Maternal Aunt   . Diabetes Neg Hx   . COPD Neg Hx   . CAD Maternal Grandfather     No Known Allergies  Current Outpatient Prescriptions on File Prior to Visit  Medication Sig Dispense Refill  . atorvastatin (LIPITOR) 10 MG tablet Take 1 tablet (10 mg total) by mouth daily. 30 tablet 2  . BENICAR HCT 40-12.5 MG tablet Take 1 tablet by mouth daily. 30 tablet 11  . carbamide peroxide (DEBROX) 6.5 % otic solution Place 5 drops into the right ear 2 (two) times daily. May use for 4 days 15 mL 0  . cloNIDine (CATAPRES) 0.1 MG tablet Take 1 tablet (0.1 mg total) by mouth 2 (two) times daily. 30 tablet 3  . empagliflozin (JARDIANCE) 25 MG TABS tablet Take 25 mg by mouth daily. 30 tablet 2  . hydrochlorothiazide (MICROZIDE) 12.5 MG capsule Take 1 capsule (12.5 mg total) by mouth daily. 30 capsule 3  . insulin aspart (NOVOLOG FLEXPEN) 100 UNIT/ML FlexPen Inject 6 units with breakfast and lunch and  8 units with supper 15 mL 2  . Insulin Glargine (TOUJEO SOLOSTAR) 300 UNIT/ML SOPN Inject 34 Units into the skin daily. 15 mL 2  . meclizine (ANTIVERT) 25 MG tablet Take 1 tablet (25 mg total) by mouth 3 (three) times daily as needed for dizziness. 15 tablet 1  . metFORMIN (GLUCOPHAGE-XR) 500 MG 24 hr tablet Take 3 tablets (1,500 mg total) by mouth daily with supper. 90 tablet 3  . NIFEdipine (NIFEDICAL XL) 60 MG 24 hr tablet Take 1 tablet (60 mg total) by mouth daily. 30 tablet 5  . NOVOFINE 32G X 6 MM MISC USE TO INJECT TOUJEO ONCE DAILY 100 each 0  . ONE TOUCH ULTRA TEST test strip Use to check blood sugar 3 times per day dx code E11.9 100 each 3  . polyethylene glycol powder (MIRALAX) powder Take 17 g by mouth daily. Mix in 4 - 8 oz of liquid.  255 g 0  . azelastine (ASTELIN) 0.1 % nasal spray Place 2 sprays into both nostrils 2 (two) times daily. Use in each nostril as directed (Patient not taking: Reported on 04/30/2016) 30 mL 3   No current facility-administered medications on file prior to visit.     BP 128/86 (BP Location: Right Arm, Patient Position: Sitting, Cuff Size: Normal)   Pulse 67   Temp 98.2 F (36.8 C) (Oral)   Ht 5\' 2"  (1.575 m)   Wt 179 lb 9.6 oz (81.5 kg)   SpO2 98%   BMI 32.85 kg/m       Objective:   Physical Exam  General Mental Status- Alert. General Appearance- Not in acute distress.   Skin General: Color- Normal Color. Moisture- Normal Moisture.  Neck Carotid Arteries- Normal color. Moisture- Normal Moisture. No carotid bruits. No JVD.  Chest and Lung Exam Auscultation: Breath Sounds:-Normal.  Cardiovascular Auscultation:Rythm- Regular. Murmurs & Other Heart Sounds:Auscultation of the heart reveals- No Murmurs.    Neurologic Cranial Nerve exam:- CN III-XII intact(No nystagmus), symmetric smile. Strength:- 5/5 equal and symmetric strength both upper and lower extremities.      Assessment & Plan:  For your htn/bp I want you to continue current regimen of nifedipine and olmesartan-hctz 40/12.5. Will add 12.5 mg of hctz. If bp is improving may rx 40-25 dose in  3 weeks or as needed.  If cardiac or neurologic signs or symptoms then ED evaluation.  Keep low sugar diet. Eat k rich foods as well.  Repeat cmp on follow up.

## 2016-04-30 NOTE — Patient Instructions (Addendum)
For your htn/bp I want you to continue current regimen of nifedipine and olmesartan-hctz 40/12.5. Will add 12.5 mg of hctz. If bp is improving may rx 40-25 dose in  3 weeks or as needed.  If cardiac or neurologic signs or symptoms then ED evaluation.  Keep low sugar diet. Eat k rich foods as well.  Repeat cmp on follow up.

## 2016-04-30 NOTE — Progress Notes (Signed)
Pre visit review using our clinic review tool, if applicable. No additional management support is needed unless otherwise documented below in the visit note. 

## 2016-05-02 MED FILL — DORZOLAMIDE HCL 2% EYE DRP: 2 | 50 days supply | Qty: 10 | Fill #3

## 2016-05-02 MED FILL — NOVOFINE 32G NEEDLES: 32G X 6 MM | 25 days supply | Qty: 100 | Fill #0

## 2016-05-05 ENCOUNTER — Telehealth: Payer: Self-pay | Admitting: Medical

## 2016-05-05 MED ORDER — HYDROCHLOROTHIAZIDE 12.5 MG PO CAPS: 12.5000 mg | ORAL_CAPSULE | Freq: Every day | ORAL | 0 refills | Status: DC

## 2016-05-05 MED FILL — HYDROCHLOROTHIAZIDE 12.5 MG: 12.5 | 30 days supply | Qty: 30 | Fill #0

## 2016-05-05 NOTE — Telephone Encounter (Signed)
Sent in hctz to her pharmacy. Day of visit fire alarm went off and pt left. So pharmacy was closed and forgot to send in the rx.

## 2016-05-14 ENCOUNTER — Telehealth: Payer: Self-pay | Admitting: Medical

## 2016-05-14 MED ORDER — NIFEDIPINE ER OSMOTIC RELEASE 60 MG PO TB24: 60.0000 mg | ORAL_TABLET | Freq: Every day | ORAL | 5 refills | Status: DC

## 2016-05-14 MED ORDER — NIFEDIPINE ER 60 MG PO TB24: 60.0000 mg | ORAL_TABLET | Freq: Every day | ORAL | 5 refills | Status: DC

## 2016-05-14 MED FILL — OLMESARTAN-HCTZ 40-12.5 MG: 40-12.5 | 30 days supply | Qty: 30 | Fill #2

## 2016-05-14 MED FILL — NIFEdipine ER 60 MG TB24: 60 | 30 days supply | Qty: 30 | Fill #4

## 2016-05-14 NOTE — Telephone Encounter (Signed)
Sent in nifedipine cc at pharmacist request.

## 2016-05-16 ENCOUNTER — Encounter: Payer: BLUE CROSS/BLUE SHIELD | Admitting: Dietician

## 2016-05-16 MED FILL — TOUJEO SOLOSTAR 300 UNITS/M: 300 | 27 days supply | Qty: 3 | Fill #4

## 2016-05-20 MED FILL — BRIMONIDINE 0.2% EYE DROP: 0.2 | 25 days supply | Qty: 5 | Fill #5

## 2016-05-20 MED FILL — TIMOLOL 0.5% EYE DROPS: 0.5 | 50 days supply | Qty: 10 | Fill #4

## 2016-06-10 MED FILL — OLMESARTAN-HCTZ 40-12.5 MG: 40-12.5 | 30 days supply | Qty: 30 | Fill #3

## 2016-06-13 MED FILL — TOUJEO SOLOSTAR 300 UNITS/M: 300 | 27 days supply | Qty: 3 | Fill #5

## 2016-06-16 MED FILL — BRIMONIDINE 0.2% EYE DROP: 0.2 | 25 days supply | Qty: 5 | Fill #6

## 2016-06-17 ENCOUNTER — Other Ambulatory Visit (INDEPENDENT_AMBULATORY_CARE_PROVIDER_SITE_OTHER): Payer: BLUE CROSS/BLUE SHIELD

## 2016-06-17 DIAGNOSIS — Z794 Long term (current) use of insulin: Secondary | ICD-10-CM

## 2016-06-17 DIAGNOSIS — E1165 Type 2 diabetes mellitus with hyperglycemia: Secondary | ICD-10-CM | POA: Diagnosis not present

## 2016-06-17 DIAGNOSIS — E782 Mixed hyperlipidemia: Secondary | ICD-10-CM

## 2016-06-17 LAB — COMPREHENSIVE METABOLIC PANEL
ALBUMIN: 3.9 g/dL (ref 3.5–5.2)
ALK PHOS: 92 U/L (ref 39–117)
ALT: 27 U/L (ref 0–35)
AST: 30 U/L (ref 0–37)
BILIRUBIN TOTAL: 0.4 mg/dL (ref 0.2–1.2)
BUN: 18 mg/dL (ref 6–23)
CO2: 31 mEq/L (ref 19–32)
Calcium: 9.3 mg/dL (ref 8.4–10.5)
Chloride: 105 mEq/L (ref 96–112)
Creatinine, Ser: 0.84 mg/dL (ref 0.40–1.20)
GFR: 71.39 mL/min (ref >60.00)
GLUCOSE: 92 mg/dL (ref 70–99)
POTASSIUM: 3.9 meq/L (ref 3.5–5.1)
Sodium: 142 mEq/L (ref 135–145)
TOTAL PROTEIN: 7.5 g/dL (ref 6.0–8.3)

## 2016-06-17 LAB — LIPID PANEL
CHOLESTEROL: 198 mg/dL (ref 0–200)
HDL: 48.7 mg/dL (ref >39.00)
LDL Cholesterol: 129 mg/dL — ABNORMAL HIGH (ref 0–99)
NONHDL: 148.8
Total CHOL/HDL Ratio: 4
Triglycerides: 97 mg/dL (ref 0.0–149.0)
VLDL: 19.4 mg/dL (ref 0.0–40.0)

## 2016-06-17 LAB — HEMOGLOBIN A1C: Hgb A1c MFr Bld: 7.9 % — ABNORMAL HIGH (ref 4.6–6.5)

## 2016-06-20 ENCOUNTER — Encounter: Payer: Self-pay | Admitting: Endocrinology

## 2016-06-20 ENCOUNTER — Ambulatory Visit (INDEPENDENT_AMBULATORY_CARE_PROVIDER_SITE_OTHER): Payer: BLUE CROSS/BLUE SHIELD | Admitting: Endocrinology

## 2016-06-20 ENCOUNTER — Telehealth: Payer: Self-pay | Admitting: Endocrinology

## 2016-06-20 VITALS — BP 148/80 | HR 71 | Ht 62.0 in | Wt 177.0 lb

## 2016-06-20 DIAGNOSIS — I10 Essential (primary) hypertension: Secondary | ICD-10-CM | POA: Diagnosis not present

## 2016-06-20 DIAGNOSIS — Z794 Long term (current) use of insulin: Secondary | ICD-10-CM | POA: Diagnosis not present

## 2016-06-20 DIAGNOSIS — E782 Mixed hyperlipidemia: Secondary | ICD-10-CM

## 2016-06-20 DIAGNOSIS — E1165 Type 2 diabetes mellitus with hyperglycemia: Secondary | ICD-10-CM

## 2016-06-20 MED ORDER — LISINOPRIL 10 MG PO TABS: 10.0000 mg | ORAL_TABLET | Freq: Every day | ORAL | 3 refills | Status: DC

## 2016-06-20 MED FILL — LISINOPRIL 10 MG TABLET: 10 | 30 days supply | Qty: 30 | Fill #0

## 2016-06-20 NOTE — Telephone Encounter (Signed)
Pt called and also needs her pen needles sent into her pharmacy.

## 2016-06-20 NOTE — Progress Notes (Signed)
Patient ID: Cynthia Mata, female   DOB: July 26, 1945, 71 y.o.   MRN: [id number]           Reason for Appointment: Follow-up for Type 2 Diabetes  Referring physician: Saguier  History of Present Illness:          Diagnosis: Type 2 diabetes mellitus, date of diagnosis: 2001        Past history: At the time of diagnosis she had symptoms of increased thirst and urination and very high blood sugars, was obese.   She was [place] with oral medications possibly glipizide and metformin She also changed her diet and was able to lose weight  However she does not think her blood sugars had been consistently controlled In 2012 she was given Levemir insulin but not clear if this helped her sugar control as her A1c was still 9.5 in 2014 She was apparently continued on Metaglip also which he had been taking for a while; she has usually taken only 1 tablet twice a day of the 5/500 tablets.  Has not had any abdominal pain or diarrhea with metformin previously  Recent history:   INSULIN regimen is described as:Toujeo 34 in am. Novolog 8-6-8 units daily Oral hypoglycemic drugs the patient is taking are: Jardiance 25 mg daily, Metformin 500bid   Recommendations on the last visit:  She will need to work with the dietitian but she has not made an appointment  Increase lunchtime dose to at least 8 units and may take 10 units if planning to eat dessert.  If her sugars after evening meal are also high she will need to take 10 units to cover the evening meal.  Restart Jardiance  Trial of metformin ER instead of regular metformin  Her A1c previously was 9.1 and is still high at 7.9  Current management, blood sugar patterns and problems:  Her daughter is not sure what doses of NovoLog she is taking are the patient does not able to see and she is only taking the dose that has been set by her daughter in the morning.  She was told to increase her NovoLog significantly but  she is not doing so  GLUCOSE readings are progressively higher from morning to night  Her fasting blood sugars are mostly in the low 100s and averaging about the same as last time  She has not had any low sugars but not clear why she is afraid of hypoglycemia with taking Novolog  She thinks she is trying to cut back on her carbohydrates and rice especially at suppertime  However even with taking significant amount of carbohydrate at lunchtime she appears to be taking only 4 units of Novolog at that time  She is losing a little weight however  Lowest blood sugar has been 99 in the morning  She has only a couple of readings after supper which are significantly high   She is taking metformin ER and has no significant side effects except median discomfort with taking 500 mg twice a day  She thinks she is taking Jardiance as directed also           Side effects from medications have been: None  Compliance with the medical regimen: Fair Hypoglycemia: none   Glucose monitoring:  done 2-3 times a day         Glucometer: One Touch.      Blood Glucose readings by monitor download:  Mean values apply above for all meters except median  for One Touch  PRE-MEAL Fasting Lunch Dinner Bedtime Overall  Glucose range: 99-189  182-333  195-362  195-367    Mean/median: 147  226   235   195    Self-care: The diet that the patient has been following is: tries to limit Sweets, carbs and fats and more vegetables .     Meals: 3 meals per day. Breakfast is eggs, beans and/or bread  .   Has chicken and vegetables for lunch and dinner with some tortillas.  Has bread and nuts for snacks    Dinner is at 6-7 pm          Exercise:  walking 3-4 times a week      Dietician visit, most recent: Never .               Weight history:  Wt Readings from Last 3 Encounters:  06/20/16 177 lb (80.3 kg)  04/30/16 179 lb 9.6 oz (81.5 kg)  04/15/16 183 lb (83 kg)    Glycemic control:   Lab Results  Component  Value Date   HGBA1C 7.9 (H) 06/17/2016   HGBA1C 7.8 (H) 04/11/2016   HGBA1C 9.1 12/24/2015   Lab Results  Component Value Date   MICROALBUR 2.4 (H) 04/11/2016   LDLCALC 129 (H) 06/17/2016   CREATININE 0.84 06/17/2016       Allergies as of 06/20/2016   No Known Allergies     Medication List       Accurate as of 06/20/16 10:18 AM. Always use your most recent med list.          atorvastatin 10 MG tablet Commonly known as:  LIPITOR Take 1 tablet (10 mg total) by mouth daily.   azelastine 0.1 % nasal spray Commonly known as:  ASTELIN Place 2 sprays into both nostrils 2 (two) times daily. Use in each nostril as directed   BENICAR HCT 40-12.5 MG tablet Generic drug:  olmesartan-hydrochlorothiazide Take 1 tablet by mouth daily.   carbamide peroxide 6.5 % otic solution Commonly known as:  DEBROX Place 5 drops into the right ear 2 (two) times daily. May use for 4 days   empagliflozin 25 MG Tabs tablet Commonly known as:  JARDIANCE Take 25 mg by mouth daily.   hydrochlorothiazide 12.5 MG capsule Commonly known as:  MICROZIDE Take 1 capsule (12.5 mg total) by mouth daily.   insulin aspart 100 UNIT/ML FlexPen Commonly known as:  NOVOLOG FLEXPEN Inject 6 units with breakfast and lunch and 8 units with supper   Insulin Glargine 300 UNIT/ML Sopn Commonly known as:  TOUJEO SOLOSTAR Inject 34 Units into the skin daily.   meclizine 25 MG tablet Commonly known as:  ANTIVERT Take 1 tablet (25 mg total) by mouth 3 (three) times daily as needed for dizziness.   metFORMIN 500 MG 24 hr tablet Commonly known as:  GLUCOPHAGE-XR Take 3 tablets (1,500 mg total) by mouth daily with supper.   NIFEdipine 60 MG 24 hr tablet Commonly known as:  NIFEDIAC CC Take 1 tablet (60 mg total) by mouth daily.   NOVOFINE 32G X 6 MM Misc Generic drug:  Insulin Pen Needle USE TO INJECT TOUJEO ONCE DAILY   ONE TOUCH ULTRA TEST test strip Generic drug:  glucose blood Use to check blood  sugar 3 times per day dx code E11.9   polyethylene glycol powder powder Commonly known as:  MIRALAX Take 17 g by mouth daily. Mix in 4 - 8 oz of liquid.  Allergies: No Known Allergies  Past Medical History:  Diagnosis Date  . Anemia   . Cataract   . Diabetes mellitus without complication (HCC)   . Hyperlipidemia   . Hypertension   . Macular degeneration   . Osteoporosis   . Thyroid disease   . Trapezius strain 10/23/2014    Past Surgical History:  Procedure Laterality Date  . ANKLE FRACTURE SURGERY     Right ankle  . CATARACT EXTRACTION    . cataracts  05-2015  . TONSILLECTOMY     Abcess removed also    Family History  Problem Relation Age of Onset  . Hypertension Maternal Aunt   . Heart disease Maternal Aunt   . Diabetes Neg Hx   . COPD Neg Hx   . CAD Maternal Grandfather     Social History:  reports that she has never smoked. She does not have any smokeless tobacco history on file. She reports that she does not drink alcohol or use drugs.    Review of Systems    Most recent eye exam was In 09/2015       Lipids: On  Rx with Lipitor 10 mg only,  prescribed by PCP but LDL is still not at goal       Lab Results  Component Value Date   CHOL 198 06/17/2016   HDL 48.70 06/17/2016   LDLCALC 129 (H) 06/17/2016   TRIG 97.0 06/17/2016   CHOLHDL 4 06/17/2016                  Thyroid: Has only transient increase in TSH once   Lab Results  Component Value Date   TSH 3.82 11/19/2015   TSH 4.753 (H) 08/11/2014   TSH 3.74 06/29/2014       The blood pressure has been high for 15 years treated with HCTZ and Procardia  by PCP Apparently she was on Benicar HCT and was not able to get this because of insurance issues and is not taking this Blood pressure appears tired and usual  LABS:  Lab on 06/17/2016  Component Date Value Ref Range Status  . Hgb A1c MFr Bld 06/17/2016 7.9* 4.6 - 6.5 % Final  . Sodium 06/17/2016 142  135 - 145 mEq/L Final  .  Potassium 06/17/2016 3.9  3.5 - 5.1 mEq/L Final  . Chloride 06/17/2016 105  96 - 112 mEq/L Final  . CO2 06/17/2016 31  19 - 32 mEq/L Final  . Glucose, Bld 06/17/2016 92  70 - 99 mg/dL Final  . BUN 16/03/9603 18  6 - 23 mg/dL Final  . Creatinine, Ser 06/17/2016 0.84  0.40 - 1.20 mg/dL Final  . Total Bilirubin 06/17/2016 0.4  0.2 - 1.2 mg/dL Final  . Alkaline Phosphatase 06/17/2016 92  39 - 117 U/L Final  . AST 06/17/2016 30  0 - 37 U/L Final  . ALT 06/17/2016 27  0 - 35 U/L Final  . Total Protein 06/17/2016 7.5  6.0 - 8.3 g/dL Final  . Albumin 54/02/8118 3.9  3.5 - 5.2 g/dL Final  . Calcium 14/78/2956 9.3  8.4 - 10.5 mg/dL Final  . GFR 21/30/8657 71.39  >60.00 mL/min Final  . Cholesterol 06/17/2016 198  0 - 200 mg/dL Final  . Triglycerides 06/17/2016 97.0  0.0 - 149.0 mg/dL Final  . HDL 84/69/6295 48.70  >39.00 mg/dL Final  . VLDL 28/41/3244 19.4  0.0 - 40.0 mg/dL Final  . LDL Cholesterol 06/17/2016 129* 0 - 99 mg/dL Final  .  Total CHOL/HDL Ratio 06/17/2016 4   Final  . NonHDL 06/17/2016 148.80   Final    Physical Examination:  BP (!) 148/80   Pulse 71   Ht 5\' 2"  (1.575 m)   Wt 177 lb (80.3 kg)   SpO2 97%   BMI 32.37 kg/m      ASSESSMENT/PLAN:  Diabetes type 2, uncontrolled with obesity and BMI 34  See history of present illness for current blood sugar patterns, problems identified and current management  A1c is still high at 7.9 She is not following instructions for her mealtime insulin and appears to be requiring significant amount of Novolog Also because of her visual difficulties she is not able to adjust her insulin at mealtime She and her daughter appear to be taking less than prescribed Novolog and also seemed to think she does not need much insulin if blood sugar is normal before eating As before her blood sugars progressively increased from morning to evening Fasting readings are on an average about 140 Not clear if she is benefiting with any improved control  with starting Jardiance although her weight is slightly better  Again discussed day-to-day management in detail Commendations:  Consultation with dietitian to help plan meals and have balanced meals with relatively fixed amount of carbohydrates since she will not be able to adjust insulin  Her NovoLog needs to be increased by 4 units for every dose for now  Her daughter will need to write down the exact doses that she is getting  No change in long acting insulin as yet  May reduce frequency of testing in the morning    HYPERTENSION: Blood pressure is relatively higher with not taking Benicar Will start her on lisinopril 10 mg daily, apparently has no intolerance to ACE inhibitor drugs on record  LIPIDS: Needs LDL below 100, for now will increase her Lipitor 20 mg, she will take 2 tablets daily until she needs a new prescription and then request the higher dose   Patient Instructions  NOVOLOG    Counseling time on subjects discussed above is over 50% of today's 25 minute visit   Nashalie Sallis 06/20/2016, 10:18 AM   Note: This office note was prepared with Insurance underwriter. Any transcriptional errors that result from this process are unintentional.

## 2016-06-20 NOTE — Patient Instructions (Addendum)
[facility]  Check blood sugars on waking up  3x weekly  Also check blood sugars about 2-3 hours after a meal and do this after different meals by rotation  Recommended blood sugar levels on waking up is 90-130 and about 2 hours after meal is 130-160  Please bring your blood sugar monitor to each visit, thank you  LIPITOR 20mg  daily

## 2016-06-21 ENCOUNTER — Encounter: Payer: Self-pay | Admitting: Endocrinology

## 2016-06-23 MED ORDER — INSULIN PEN NEEDLE 32G X 6 MM MISC: 0 refills | Status: DC

## 2016-06-23 MED FILL — NOVOFINE 32G NEEDLES: 32G X 6 MM | 90 days supply | Qty: 100 | Fill #0

## 2016-06-23 MED FILL — NIFEdipine ER 60 MG TB24: 60 | 30 days supply | Qty: 30 | Fill #0

## 2016-06-23 NOTE — Telephone Encounter (Signed)
Refill submitted. 

## 2016-07-07 ENCOUNTER — Telehealth: Payer: Self-pay | Admitting: *Deleted

## 2016-07-07 MED FILL — TOUJEO SOLOSTAR 300 UNITS/M: 300 | 27 days supply | Qty: 3 | Fill #6

## 2016-07-07 NOTE — Telephone Encounter (Signed)
Dr. Lucianne MussKumar,  Thanks for seeing Cynthia Mata. Recently per pt she was change to lisinopril 10 mg a day. She was formerly on benicar 40/12.5 a day in past but her bp varied and per pt history she would have systolic elevation at times at night of 170-180 even with benciar.Recenlty with lisinopril pt son describes the same elevation.  Would you recommend bumping her up to lisinopril 20 mg a day. Wanted to coordinate change with you.  Thanks again for your help.

## 2016-07-07 NOTE — Telephone Encounter (Signed)
Dr. Lucianne MussKumar agrees to increase the lisinopril to 20 mg a day. Will you call pt or son and notify of that change. Will stay of benicar for now. Have pt schedule appointment with me in 10-14 days for bp check. Thanks

## 2016-07-07 NOTE — Telephone Encounter (Signed)
Pt son states Dr. Lucianne MussKumar changed her to lisinopril 10 mg a day. He told her to stop benicar. But last at night her blood at night is 180 systolic. Pt is not using clonidine since it made her dizzy in the past.   Will send message to Dr. Lucianne Musskumar to see if he wants to increase dose of lisinopril to 20 mg. Also asked pt son to try to call his office to get advise as well.

## 2016-07-07 NOTE — Telephone Encounter (Signed)
Agree with 20 mg lisinopril.  Apparently she was not taking Benicar when I saw her because of lack of insurance coverage for this

## 2016-07-07 NOTE — Telephone Encounter (Signed)
Received call from [place] at [facility]. Pt is requesting refill for generic benicar hctz but medication was taken off med list by Dr Lucianne MussKumar at 06/20/16 visit. Pt told him she was not taking it because of insurance issues (will not pay for brand name). Pharmacy states pt has filled rx every month under generic form and insurance has been paying. Please advise if pt should continue generic benicar hctz?

## 2016-07-07 NOTE — Telephone Encounter (Signed)
Notified pt's son and daughter and they voice understanding. Advised her we will hold off on sending new Rx of 20mg  until pt is seen for follow up to determine if any further changes need to be made. Pt has already scheduled f/u for 07/21/16 with PCP. Daughter states she will bring pt in earlier or call us if BP readings remain elevated on new dose of lisinopril.

## 2016-07-14 MED FILL — TIMOLOL 0.5% EYE DROPS: 0.5 | 50 days supply | Qty: 10 | Fill #5

## 2016-07-14 MED FILL — DORZOLAMIDE HCL 2% EYE DRP: 2 | 50 days supply | Qty: 10 | Fill #4

## 2016-07-15 MED FILL — BRIMONIDINE 0.2% EYE DROP: 0.2 | 25 days supply | Qty: 5 | Fill #0

## 2016-07-21 ENCOUNTER — Encounter (HOSPITAL_BASED_OUTPATIENT_CLINIC_OR_DEPARTMENT_OTHER): Payer: Self-pay

## 2016-07-21 ENCOUNTER — Ambulatory Visit (HOSPITAL_BASED_OUTPATIENT_CLINIC_OR_DEPARTMENT_OTHER)
Admission: RE | Admit: 2016-07-21 | Discharge: 2016-07-21 | Disposition: A | Discharge status: Home / Self Care | Payer: BLUE CROSS/BLUE SHIELD | Source: Ambulatory Visit | Attending: Medical | Admitting: Medical

## 2016-07-21 ENCOUNTER — Ambulatory Visit (INDEPENDENT_AMBULATORY_CARE_PROVIDER_SITE_OTHER): Payer: BLUE CROSS/BLUE SHIELD | Admitting: Medical

## 2016-07-21 ENCOUNTER — Encounter: Payer: Self-pay | Admitting: Medical

## 2016-07-21 VITALS — BP 140/80 | HR 76 | Temp 98.1°F | Resp 16 | Ht 62.0 in | Wt 181.0 lb

## 2016-07-21 DIAGNOSIS — I10 Essential (primary) hypertension: Secondary | ICD-10-CM | POA: Diagnosis not present

## 2016-07-21 DIAGNOSIS — Z1231 Encounter for screening mammogram for malignant neoplasm of breast: Secondary | ICD-10-CM | POA: Diagnosis not present

## 2016-07-21 DIAGNOSIS — L089 Local infection of the skin and subcutaneous tissue, unspecified: Secondary | ICD-10-CM

## 2016-07-21 DIAGNOSIS — Z1239 Encounter for other screening for malignant neoplasm of breast: Secondary | ICD-10-CM

## 2016-07-21 DIAGNOSIS — M79676 Pain in unspecified toe(s): Secondary | ICD-10-CM

## 2016-07-21 DIAGNOSIS — B351 Tinea unguium: Secondary | ICD-10-CM | POA: Diagnosis not present

## 2016-07-21 HISTORY — DX: Solitary cyst of unspecified breast: N60.09

## 2016-07-21 MED ORDER — CEPHALEXIN 500 MG PO CAPS: 500.0000 mg | ORAL_CAPSULE | Freq: Two times a day (BID) | ORAL | 0 refills | Status: DC

## 2016-07-21 MED ORDER — OLMESARTAN MEDOXOMIL-HCTZ 40-25 MG PO TABS: 1.0000 | ORAL_TABLET | Freq: Every day | ORAL | 3 refills | Status: DC

## 2016-07-21 MED FILL — CEPHALEXIN 500 MG CAPSULE: 500 | 7 days supply | Qty: 14 | Fill #0

## 2016-07-21 MED FILL — OLMESARTAN-HCTZ 40-25 MG TA: 40-25 | 30 days supply | Qty: 30 | Fill #0

## 2016-07-21 NOTE — Progress Notes (Signed)
Pre visit review using our clinic review tool, if applicable. No additional management support is needed unless otherwise documented below in the visit note/SLS  

## 2016-07-21 NOTE — Progress Notes (Signed)
Subjective:    Patient ID: Cynthia Mata, female    DOB: 10/15/1945, 71 y.o.   MRN: [id number]  HPI  Pt in today for follow up on her blood pressure.   Pt had recent bp change to lisinopril. Recommended by Dr. Lucianne Musskumar to protect her kidney. Pt last used benicar/hctz in the past but her pharmacy will only give her generic. Pt is on nifedipine. Pt has clonidine as well.   Pt states lisinipril did not help much and bp increaed to much late in the day.   Pt when she on benicar/hct, nifedipine and clonidine had better bp contro. Max bp systolic was 180 max sometimes at nigh when on lisinpril. She would use clonidine and her bp would be controlled most recently.   Currently no cardiac or neurolgic signs or symptoms.   Pt has one month slight loose great toe nail left side(no trauma). Faint tender. Mild dc and smell when she squeezes      Review of Systems  Constitutional: Negative for chills, diaphoresis and fever.  HENT: Negative for dental problem.   Respiratory: Negative for cough, chest tightness, shortness of breath and wheezing.   Cardiovascular: Negative for chest pain and palpitations.  Gastrointestinal: Negative for abdominal pain.  Musculoskeletal: Negative for back pain.       See hpi  Neurological: Negative for dizziness and headaches.  Psychiatric/Behavioral: Negative for behavioral problems and confusion.   Past Medical History:  Diagnosis Date  . Anemia   . Cataract   . Diabetes mellitus without complication (HCC)   . Hyperlipidemia   . Hypertension   . Macular degeneration   . Osteoporosis   . Thyroid disease   . Trapezius strain 10/23/2014     Social History   Social History  . Marital status: Married    Spouse name: N/A  . Number of children: 5  . Years of education: N/A   Occupational History  . Not on file.   Social History Main Topics  . Smoking status: Never Smoker  . Smokeless tobacco: Never Used  . Alcohol use No  . Drug  use: No  . Sexual activity: Not on file   Other Topics Concern  . Not on file   Social History Narrative   Lives with daughter.      Past Surgical History:  Procedure Laterality Date  . ANKLE FRACTURE SURGERY     Right ankle  . CATARACT EXTRACTION    . cataracts  05-2015  . TONSILLECTOMY     Abcess removed also    Family History  Problem Relation Age of Onset  . Hypertension Maternal Aunt   . Heart disease Maternal Aunt   . Diabetes Neg Hx   . COPD Neg Hx   . CAD Maternal Grandfather     No Known Allergies  Current Outpatient Prescriptions on File Prior to Visit  Medication Sig Dispense Refill  . atorvastatin (LIPITOR) 10 MG tablet Take 1 tablet (10 mg total) by mouth daily. 30 tablet 2  . azelastine (ASTELIN) 0.1 % nasal spray Place 2 sprays into both nostrils 2 (two) times daily. Use in each nostril as directed 30 mL 3  . empagliflozin (JARDIANCE) 25 MG TABS tablet Take 25 mg by mouth daily. 30 tablet 2  . insulin aspart (NOVOLOG FLEXPEN) 100 UNIT/ML FlexPen Inject 6 units with breakfast and lunch and 8 units with supper 15 mL 2  . Insulin Glargine (TOUJEO SOLOSTAR) 300 UNIT/ML SOPN Inject 34 Units into  the skin daily. 15 mL 2  . Insulin Pen Needle (NOVOFINE) 32G X 6 MM MISC USE TO INJECT TOUJEO ONCE DAILY 100 each 0  . meclizine (ANTIVERT) 25 MG tablet Take 1 tablet (25 mg total) by mouth 3 (three) times daily as needed for dizziness. 15 tablet 1  . metFORMIN (GLUCOPHAGE-XR) 500 MG 24 hr tablet Take 3 tablets (1,500 mg total) by mouth daily with supper. 90 tablet 3  . NIFEdipine (NIFEDIAC CC) 60 MG 24 hr tablet Take 1 tablet (60 mg total) by mouth daily. 30 tablet 5  . ONE TOUCH ULTRA TEST test strip Use to check blood sugar 3 times per day dx code E11.9 100 each 3  . polyethylene glycol powder (MIRALAX) powder Take 17 g by mouth daily. Mix in 4 - 8 oz of liquid. 255 g 0  . hydrochlorothiazide (MICROZIDE) 12.5 MG capsule Take 1 capsule (12.5 mg total) by mouth daily.  (Patient not taking: Reported on 07/21/2016) 30 capsule 0   No current facility-administered medications on file prior to visit.     BP 140/80   Pulse 76   Temp 98.1 F (36.7 C) (Oral)   Resp 16   Ht 5\' 2"  (1.575 m)   Wt 181 lb (82.1 kg)   SpO2 100%   BMI 33.11 kg/m       Objective:   Physical Exam  General Mental Status- Alert. General Appearance- Not in acute distress.   Skin General: Color- Normal Color. Moisture- Normal Moisture.  Neck Carotid Arteries- Normal color. Moisture- Normal Moisture. No carotid bruits. No JVD.  Chest and Lung Exam Auscultation: Breath Sounds:-Normal.  Cardiovascular Auscultation:Rythm- Regular. Murmurs & Other Heart Sounds:Auscultation of the heart reveals- No Murmurs.  Abdomen Inspection:-Inspeection Normal. Palpation/Percussion:Note:No mass. Palpation and Percussion of the abdomen reveal- Non Tender, Non Distended + BS, no rebound or guarding.    Neurologic Cranial Nerve exam:- CN III-XII intact(No nystagmus), symmetric smile. Strength:- 5/5 equal and symmetric strength both upper and lower extremities.  Left great toe- faint thick nail and faint yellow. No dc to bases presenlty.     Assessment & Plan:  For your high blood pressure will rx benicar 40/25 to take one tab a day. Continue current bp meds the same but stop lisinopril   Order your mammogram today. Please stop by radiology today.  Rx keflex for possible nail bed infection as your described but refer to podiatrist as well.  Follow up in one month check bp and get cmp.  Cynthia Mata, Cynthia Dredge, PA-C

## 2016-07-21 NOTE — Patient Instructions (Addendum)
For your high blood pressure will rx benicar 40/25 to take one tab a day. Continue current bp meds the same but stop lisinopril   Order your mammogram today. Please stop by radiology today.  Rx keflex for possible nail bed infection as your described but refer to podiatrist as well.  Follow up in one month check bp and get cmp.  On follow 30 minute appointment will do pap on that day.

## 2016-07-22 ENCOUNTER — Telehealth: Payer: Self-pay | Admitting: Medical

## 2016-07-22 DIAGNOSIS — M8580 Other specified disorders of bone density and structure, unspecified site: Secondary | ICD-10-CM

## 2016-07-22 NOTE — Telephone Encounter (Signed)
-----   Message from Esperanza RichtersEdward Saguier, PA-C sent at 07/22/2016  8:14 AM EST ----- Notify pt or her son that she had normal mammogram.

## 2016-07-22 NOTE — Telephone Encounter (Signed)
Pt was informed the below and pt understood. °

## 2016-07-23 MED FILL — NIFEdipine ER 60 MG TB24: 60 | 30 days supply | Qty: 30 | Fill #1

## 2016-07-29 ENCOUNTER — Ambulatory Visit (INDEPENDENT_AMBULATORY_CARE_PROVIDER_SITE_OTHER): Payer: BLUE CROSS/BLUE SHIELD | Admitting: Podiatry

## 2016-07-29 ENCOUNTER — Encounter: Payer: Self-pay | Admitting: Podiatry

## 2016-07-29 DIAGNOSIS — M79675 Pain in left toe(s): Secondary | ICD-10-CM

## 2016-07-29 DIAGNOSIS — L601 Onycholysis: Secondary | ICD-10-CM

## 2016-07-29 DIAGNOSIS — B351 Tinea unguium: Secondary | ICD-10-CM | POA: Diagnosis not present

## 2016-07-29 DIAGNOSIS — M79674 Pain in right toe(s): Secondary | ICD-10-CM

## 2016-07-29 NOTE — Patient Instructions (Signed)

## 2016-07-29 NOTE — Progress Notes (Signed)
   Subjective:    Patient ID: Cynthia Mata, female    DOB: 05/09/1946, 71 y.o.   MRN: [id number]  HPI    Review of Systems     Objective:   Physical Exam        Assessment & Plan:

## 2016-07-29 NOTE — Progress Notes (Signed)
   Subjective:    Patient ID: Cynthia Mata, female    DOB: 06/04/1946, 71 y.o.   MRN: [id number]  HPI  71 year old female the office in the daughter for concerns of thick, painful, elongated toenails that she cannot trim herself. She also uses her left big toe nail has been lifting from the underlying nail bed. She pretty without antibiotics for possible infection. She has pain in the entire toenail. She Denies any swelling redness or drainage or any pus. The left A1c was 7.5. She denies any claudication symptoms.She has no other complaints today.  Review of Systems  All other systems reviewed and are negative.      Objective:   Physical Exam General: AAO x3, NAD  Dermatological: Nails are hypertrophic, dystrophic, brittle, discolored, elongated 10. No surrounding redness or drainage. Tenderness nails 1-5 bilaterally. Upon debridement the left hallux toenail severely loose of underlying nail bed is only at The Proximal Medial and Lateral Nail Border Was Lifted from the Proximal Nail Border Causing Swelling to the Proximal Nail Border. There Is Macerated Tissue underneath the Toenail and There Was a Macerated Malodor Present. No open lesions or pre-ulcerative lesions are identified today.  Vascular: Dorsalis Pedis artery and Posterior Tibial artery pedal pulses are 2/4 bilateral with immedate capillary fill time.  There is no pain with calf compression, swelling, warmth, erythema.   Neruologic: Grossly intact via light touch bilateral. Vibratory intact via tuning fork bilateral. Protective threshold with Semmes Wienstein monofilament intact to all pedal sites bilateral.   Musculoskeletal: No gross boney pedal deformities bilateral. No pain, crepitus, or limitation noted with foot and ankle range of motion bilateral. Muscular strength 5/5 in all groups tested bilateral.  Gait: Unassisted, Nonantalgic.       Assessment & Plan:  71 year old female with symptomatic  onychomycosis, Lefthallux onycholysis -Treatment options discussed including all alternatives, risks, and complications -Etiology of symptoms were discussed -Nails debrided x 10 without complications or bleeding -Upon debridement the left hallux toenail severely loosened and there is macerated tissue underneath. The nail on the nail bed and there is malodor. Because of this I recommended go ahead and remove the entire toenail. Under sterile conditions a mixture of lidocaine, Marcaine plain was infiltrated in a hallux block fashion. Areas prepped with Betadine. The toenail was then removed in total without any complications. The nails. Along the proximal medial, lateral nail borders. There was macerated nail bed. The area was cleaned with Alcohol hemostasis was achieved. Silvadene was applied followed by a bandage. Post procedure structures were discussed. Monitor for infection. -RTC in 2 weeks or sooner if needed.  Ovid CurdMatthew Wagoner, DPM

## 2016-08-01 LAB — HM DIABETES EYE EXAM

## 2016-08-04 ENCOUNTER — Encounter: Payer: Self-pay | Admitting: Medical

## 2016-08-04 ENCOUNTER — Ambulatory Visit (HOSPITAL_BASED_OUTPATIENT_CLINIC_OR_DEPARTMENT_OTHER)
Admission: RE | Admit: 2016-08-04 | Discharge: 2016-08-04 | Disposition: A | Discharge status: Home / Self Care | Payer: BLUE CROSS/BLUE SHIELD | Source: Ambulatory Visit | Attending: Medical | Admitting: Medical

## 2016-08-04 ENCOUNTER — Other Ambulatory Visit (HOSPITAL_COMMUNITY)
Admission: RE | Admit: 2016-08-04 | Discharge: 2016-08-04 | Disposition: A | Discharge status: Home / Self Care | Payer: BLUE CROSS/BLUE SHIELD | Source: Ambulatory Visit | Attending: Family Medicine | Admitting: Family Medicine

## 2016-08-04 ENCOUNTER — Ambulatory Visit (INDEPENDENT_AMBULATORY_CARE_PROVIDER_SITE_OTHER): Payer: BLUE CROSS/BLUE SHIELD | Admitting: Medical

## 2016-08-04 VITALS — BP 134/61 | HR 74 | Temp 98.0°F | Resp 16 | Ht 62.0 in | Wt 177.5 lb

## 2016-08-04 DIAGNOSIS — Z124 Encounter for screening for malignant neoplasm of cervix: Secondary | ICD-10-CM

## 2016-08-04 DIAGNOSIS — E2839 Other primary ovarian failure: Secondary | ICD-10-CM

## 2016-08-04 DIAGNOSIS — Z01411 Encounter for gynecological examination (general) (routine) with abnormal findings: Secondary | ICD-10-CM | POA: Insufficient documentation

## 2016-08-04 DIAGNOSIS — N898 Other specified noninflammatory disorders of vagina: Secondary | ICD-10-CM

## 2016-08-04 DIAGNOSIS — N6452 Nipple discharge: Secondary | ICD-10-CM

## 2016-08-04 DIAGNOSIS — M8588 Other specified disorders of bone density and structure, other site: Secondary | ICD-10-CM | POA: Diagnosis not present

## 2016-08-04 MED ORDER — ALENDRONATE SODIUM 10 MG PO TABS: 10.0000 mg | ORAL_TABLET | Freq: Every day | ORAL | 11 refills | Status: DC

## 2016-08-04 NOTE — Patient Instructions (Addendum)
We did you pap today.   Try to schedule dexa scan today as you leave.  I am going to refer you Spanish Speaking gyn to evaluate lesion on your vaginal area and give opinion on white discharge from left breast.  Follow up in 2 months or as needed

## 2016-08-04 NOTE — Telephone Encounter (Signed)
Fosamax rx sent in. Vitamin d level lab placed.

## 2016-08-04 NOTE — Progress Notes (Signed)
Pre visit review using our clinic review tool, if applicable. No additional management support is needed unless otherwise documented below in the visit note/SLS  

## 2016-08-04 NOTE — Progress Notes (Signed)
Subjective:    Patient ID: Cynthia Mata, female    DOB: 04/01/1946, 71 y.o.   MRN: [id number]  HPI   Pt in for follow up.   Pt is going to get a pap smear. Pt went to gyn recently and told her was not necessary. Pt last papsmear was negative. But does want another pap smear.   Pt feels peas sized lump in her vaginal area.   Pt had mammogram done already.  Pt is willing to get dexascan.      Review of Systems  Constitutional: Negative for chills, diaphoresis, fatigue and fever.  Respiratory: Negative for cough, chest tightness, shortness of breath and wheezing.   Cardiovascular: Negative for chest pain and palpitations.  Gastrointestinal: Negative for abdominal distention and anal bleeding.  Genitourinary: Negative for decreased urine volume, difficulty urinating, dyspareunia, flank pain, pelvic pain, urgency and vaginal pain.       See hpi and physcial exam  Musculoskeletal: Negative for back pain.  Skin:       See hpi. Left breast.  Neurological: Negative for dizziness and headaches.  Hematological: Negative for adenopathy. Does not bruise/bleed easily.  Psychiatric/Behavioral: Negative for agitation and confusion.    Past Medical History:  Diagnosis Date  . Anemia   . Breast cyst   . Cataract   . Diabetes mellitus without complication (HCC)   . Hyperlipidemia   . Hypertension   . Macular degeneration   . Osteoporosis   . Thyroid disease   . Trapezius strain 10/23/2014     Social History   Social History  . Marital status: Married    Spouse name: N/A  . Number of children: 5  . Years of education: N/A   Occupational History  . Not on file.   Social History Main Topics  . Smoking status: Never Smoker  . Smokeless tobacco: Never Used  . Alcohol use No  . Drug use: No  . Sexual activity: Not on file   Other Topics Concern  . Not on file   Social History Narrative   Lives with daughter.      Past Surgical History:  Procedure  Laterality Date  . ANKLE FRACTURE SURGERY     Right ankle  . CATARACT EXTRACTION    . cataracts  05-2015  . TONSILLECTOMY     Abcess removed also    Family History  Problem Relation Age of Onset  . Hypertension Maternal Aunt   . Heart disease Maternal Aunt   . CAD Maternal Grandfather   . Diabetes Neg Hx   . COPD Neg Hx     No Known Allergies  Current Outpatient Prescriptions on File Prior to Visit  Medication Sig Dispense Refill  . insulin aspart (NOVOLOG FLEXPEN) 100 UNIT/ML FlexPen Inject 6 units with breakfast and lunch and 8 units with supper 15 mL 2  . Insulin Glargine (TOUJEO SOLOSTAR) 300 UNIT/ML SOPN Inject 34 Units into the skin daily. 15 mL 2  . Insulin Pen Needle (NOVOFINE) 32G X 6 MM MISC USE TO INJECT TOUJEO ONCE DAILY 100 each 0  . meclizine (ANTIVERT) 25 MG tablet Take 1 tablet (25 mg total) by mouth 3 (three) times daily as needed for dizziness. 15 tablet 1  . metFORMIN (GLUCOPHAGE-XR) 500 MG 24 hr tablet Take 3 tablets (1,500 mg total) by mouth daily with supper. 90 tablet 3  . NIFEdipine (NIFEDIAC CC) 60 MG 24 hr tablet Take 1 tablet (60 mg total) by mouth daily. 30 tablet  5  . olmesartan-hydrochlorothiazide (BENICAR HCT) 40-25 MG tablet Take 1 tablet by mouth daily. 30 tablet 3  . ONE TOUCH ULTRA TEST test strip Use to check blood sugar 3 times per day dx code E11.9 100 each 3  . polyethylene glycol powder (MIRALAX) powder Take 17 g by mouth daily. Mix in 4 - 8 oz of liquid. 255 g 0  . atorvastatin (LIPITOR) 10 MG tablet Take 1 tablet (10 mg total) by mouth daily. 30 tablet 2  . azelastine (ASTELIN) 0.1 % nasal spray Place 2 sprays into both nostrils 2 (two) times daily. Use in each nostril as directed 30 mL 3  . empagliflozin (JARDIANCE) 25 MG TABS tablet Take 25 mg by mouth daily. 30 tablet 2   No current facility-administered medications on file prior to visit.     BP 134/61 (BP Location: Right Arm, Patient Position: Sitting, Cuff Size: Large)   Pulse 74    Temp 98 F (36.7 C) (Oral)   Resp 16   Ht 5\' 2"  (1.575 m)   Wt 177 lb 8 oz (80.5 kg)   SpO2 100%   BMI 32.47 kg/m        Objective:   Physical Exam  General- No acute distress. Pleasant patient. Neck- Full range of motion, no jvd Lungs- Clear, even and unlabored. Heart- regular rate and rhythm. Neurologic- CNII- XII grossly intact.  Abdomen- soft, nt,nd, +bs, no rebound or guarding.   Breast Breast Lump: No palpable masses, symmetric, no axillary lymphadenopathy palpated. On palpation of left nipple no discharge seen.   Vaginal External: Labia majora and minora normal/no lesions. Pelvic/Bimanual exam: Cervical OS not red or friable. No discharge. No cervical motion tenderness. No masses felt on palpation of adnexal regions.  At vaginal inferior entrance has small polyp shaped lesion. About 7-8 mm         Assessment & Plan:  We did you pap today.   Try to schedule dexa scan today as you leave.  I am going to refer you Spanish Speaking gyn to evaluate lesion on your vaginal area and give opinion on white discharge left breast.  Follow up in 2 months or as needed  Jemari Hallum, Ramon Dredge, [employer]

## 2016-08-05 MED FILL — TOUJEO SOLOSTAR 300 UNITS/M: 300 | 27 days supply | Qty: 3 | Fill #7

## 2016-08-05 MED FILL — ALENDRONATE NA 10 MG TAB: 10 | 30 days supply | Qty: 30 | Fill #0

## 2016-08-05 NOTE — Telephone Encounter (Signed)
Called pt and LVM for pt to return call.

## 2016-08-06 LAB — CYTOLOGY - PAP: DIAGNOSIS: NEGATIVE

## 2016-08-07 ENCOUNTER — Telehealth: Payer: Self-pay | Admitting: Medical

## 2016-08-07 NOTE — Telephone Encounter (Signed)
LVM for pt to return call

## 2016-08-07 NOTE — Telephone Encounter (Signed)
-----   Message from Cynthia RichtersEdward Saguier, PA-C sent at 08/06/2016  8:19 PM EST ----- Let pt know that her pap smear was negative/normal.

## 2016-08-15 ENCOUNTER — Telehealth: Payer: Self-pay | Admitting: Endocrinology

## 2016-08-15 ENCOUNTER — Other Ambulatory Visit: Payer: Self-pay | Admitting: Endocrinology

## 2016-08-15 NOTE — Telephone Encounter (Signed)
Pt is in need of pen needles for refill 4 needles daily novofine pen needles called into [phone]

## 2016-08-18 ENCOUNTER — Other Ambulatory Visit: Payer: Self-pay

## 2016-08-18 MED ORDER — INSULIN PEN NEEDLE 32G X 6 MM MISC: 3 refills | Status: DC

## 2016-08-18 MED ORDER — INSULIN ASPART 100 UNIT/ML FLEXPEN: PEN_INJECTOR | SUBCUTANEOUS | 2 refills | Status: DC

## 2016-08-18 MED FILL — NOVOFINE 32G NEEDLES: 32G X 6 MM | 50 days supply | Qty: 200 | Fill #0

## 2016-08-18 MED FILL — NOVOLOG FLEXPEN SYRINGE: 100 | 75 days supply | Qty: 15 | Fill #0

## 2016-08-18 NOTE — Telephone Encounter (Signed)
This has been ordered 

## 2016-08-18 NOTE — Telephone Encounter (Signed)
Ordered

## 2016-08-18 NOTE — Telephone Encounter (Signed)
novolin needs to be refilled the rapid action insulin  [facility]

## 2016-08-19 ENCOUNTER — Ambulatory Visit (INDEPENDENT_AMBULATORY_CARE_PROVIDER_SITE_OTHER): Payer: BLUE CROSS/BLUE SHIELD | Admitting: Podiatry

## 2016-08-19 ENCOUNTER — Encounter: Payer: Self-pay | Admitting: Podiatry

## 2016-08-19 DIAGNOSIS — L601 Onycholysis: Secondary | ICD-10-CM | POA: Diagnosis not present

## 2016-08-20 NOTE — Progress Notes (Signed)
Subjective: Jobe IgoCecilia Belzenie de Norma [name] is a 71 y.o.  female returns to office today for follow up evaluation after having left Hallux total temporary nail avulsion performed due to onycholysis. Patient has been soaking using epsom salts and applying topical antibiotic covered with bandaid daily but has stopped as the wound has healed. Patient denies fevers, chills, nausea, vomiting. Denies any calf pain, chest pain, SOB.   Objective:  Vitals: Reviewed  General: Well developed, nourished, in no acute distress, alert and oriented x3   Dermatology: Skin is warm, dry and supple bilateral. Left hallux nail bed appears to be clean, dry, with mild granular tissue and surrounding scab. There is no surrounding erythema, edema, drainage/purulence. The remaining nails appear unremarkable at this time. There are no other lesions or other signs of infection present.  Neurovascular status: Intact. No lower extremity swelling; No pain with calf compression bilateral.  Musculoskeletal: Decreased tenderness to palpation of the left hallux nail bed. Muscular strength within normal limits bilateral.   Assesement and Plan: S/p partial nail avulsion, doing well.   -Continue soaking in epsom salts twice a day followed by antibiotic ointment and a band-aid. Can leave uncovered at night. Continue this until completely healed.  -Follow-up in 3 months for routine care or sooner if needed -Monitor for any signs/symptoms of infection. Call the office immediately if any occur or go directly to the emergency room. Call with any questions/concerns.  Ovid CurdMatthew Alanea Woolridge, DPM

## 2016-08-22 MED FILL — NIFEdipine ER 60 MG TB24: 60 | 30 days supply | Qty: 30 | Fill #2

## 2016-09-01 MED FILL — TOUJEO SOLOSTAR 300 UNITS/M: 300 | 27 days supply | Qty: 3 | Fill #8

## 2016-09-01 MED FILL — OLMESARTAN-HCTZ 40-25 MG TA: 40-25 | 30 days supply | Qty: 30 | Fill #1

## 2016-09-01 MED FILL — TIMOLOL 0.5% EYE DROPS: 0.5 | 50 days supply | Qty: 10 | Fill #6

## 2016-09-01 MED FILL — BRIMONIDINE 0.2% EYE DROP: 0.2 | 25 days supply | Qty: 5 | Fill #1

## 2016-09-12 MED FILL — cloNIDine HCL 0.1 MG TABS: 0.1 | 15 days supply | Qty: 30 | Fill #1

## 2016-09-12 MED FILL — DORZOLAMIDE HCL 2% EYE DRP: 2 | 50 days supply | Qty: 10 | Fill #5

## 2016-09-29 MED FILL — NIFEdipine ER 60 MG TB24: 60 | 30 days supply | Qty: 30 | Fill #3

## 2016-09-29 MED FILL — TOUJEO SOLOSTAR 300 UNITS/M: 300 | 27 days supply | Qty: 3 | Fill #9

## 2016-10-09 MED FILL — BRIMONIDINE 0.2% EYE DROP: 0.2 | 25 days supply | Qty: 5 | Fill #2

## 2016-10-17 MED FILL — OLMESARTAN-HCTZ 40-25 MG TA: 40-25 | 30 days supply | Qty: 30 | Fill #2

## 2016-10-22 ENCOUNTER — Encounter: Payer: Self-pay | Admitting: Gynecology

## 2016-10-28 MED FILL — TOUJEO SOLOSTAR 300 UNITS/M: 300 | 27 days supply | Qty: 3 | Fill #10

## 2016-10-31 MED FILL — TIMOLOL 0.5% EYE DROPS: 0.5 | 25 days supply | Qty: 5 | Fill #0

## 2016-10-31 MED FILL — DORZOLAMIDE-TIMOLOL EYE DRP: 22.3-6.8 | 50 days supply | Qty: 10 | Fill #0

## 2016-11-12 MED FILL — NIFEdipine ER 60 MG TB24: 60 | 30 days supply | Qty: 30 | Fill #4

## 2016-11-12 MED FILL — ALENDRONATE NA 10 MG TAB: 10 | 30 days supply | Qty: 30 | Fill #1

## 2016-11-14 MED FILL — BRIMONIDINE 0.2% EYE DROP: 0.2 | 90 days supply | Qty: 15 | Fill #0

## 2016-11-18 ENCOUNTER — Ambulatory Visit (INDEPENDENT_AMBULATORY_CARE_PROVIDER_SITE_OTHER): Payer: BLUE CROSS/BLUE SHIELD | Admitting: Podiatry

## 2016-11-18 ENCOUNTER — Telehealth: Payer: Self-pay | Admitting: Endocrinology

## 2016-11-18 DIAGNOSIS — L84 Corns and callosities: Secondary | ICD-10-CM

## 2016-11-18 DIAGNOSIS — M79674 Pain in right toe(s): Secondary | ICD-10-CM | POA: Diagnosis not present

## 2016-11-18 DIAGNOSIS — B351 Tinea unguium: Secondary | ICD-10-CM | POA: Diagnosis not present

## 2016-11-18 DIAGNOSIS — M79675 Pain in left toe(s): Secondary | ICD-10-CM | POA: Diagnosis not present

## 2016-11-18 NOTE — Progress Notes (Signed)
Subjective: 71 y.o. returns the office today for painful, elongated, thickened toenails which they cannot trim themself. Denies any redness or drainage around the nails. Also has a corn on the left fifth toe which is painful with pressure in shoes. Denies any redness or drainage or any swelling. Denies any acute changes since last appointment and no new complaints today. Denies any systemic complaints such as fevers, chills, nausea, vomiting.   Objective: AAO 3, NAD DP/PT pulses palpable, CRT less than 3 seconds Nails hypertrophic, dystrophic, elongated, brittle, discolored 9. There is tenderness overlying the nails 1-5 on the right and 2 through 5 on the left. No toenails present on the left hallux.. There is no surrounding erythema or drainage along the nail sites. Hyperkeratotic lesion left fifth toe on the dorsal lateral PIPJ. No underlying ulceration, drainage or any signs of infection. No open lesions or other pre-ulcerative lesions are identified. Hammertoes are present. No other areas of tenderness bilateral lower extremities. No overlying edema, erythema, increased warmth. No pain with calf compression, swelling, warmth, erythema.  Assessment: Patient presents with symptomatic onychomycosis; hyperkeratotic lesion  Plan: -Treatment options including alternatives, risks, complications were discussed -Nails sharply debrided 9 without complication/bleeding. -Hyperkeratotic lesion sharply debrided 1 without complications or bleeding. -Discussed daily foot inspection. If there are any changes, to call the office immediately.  -Follow-up in 3 months or sooner if any problems are to arise. In the meantime, encouraged to call the office with any questions, concerns, changes symptoms.  Ovid CurdMatthew Wagoner, DPM

## 2016-11-18 NOTE — Telephone Encounter (Signed)
Called patient to schedule follow up, no answer.

## 2016-11-24 MED FILL — TOUJEO SOLOSTAR 300 UNITS/M: 300 | 27 days supply | Qty: 3 | Fill #11

## 2016-11-25 MED FILL — OLMESARTAN-HCTZ 40-25 MG TA: 40-25 | 30 days supply | Qty: 30 | Fill #3

## 2016-12-22 ENCOUNTER — Telehealth: Payer: Self-pay | Admitting: Medical

## 2016-12-22 MED FILL — NOVOFINE 32G NEEDLES: 32G X 6 MM | 50 days supply | Qty: 200 | Fill #1

## 2016-12-22 MED FILL — TOUJEO SOLOSTAR 300 UNITS/M: 300 | 27 days supply | Qty: 3 | Fill #12

## 2016-12-22 MED FILL — DORZOLAMIDE-TIMOLOL EYE DRP: 22.3-6.8 | 50 days supply | Qty: 10 | Fill #1

## 2016-12-22 MED FILL — NIFEdipine ER 60 MG TB24: 60 | 30 days supply | Qty: 30 | Fill #5

## 2016-12-24 ENCOUNTER — Telehealth: Payer: Self-pay | Admitting: Medical

## 2016-12-24 MED FILL — OLMESARTAN-HCTZ 40-25 MG TA: 40-25 | 30 days supply | Qty: 30 | Fill #0

## 2016-12-24 NOTE — Telephone Encounter (Signed)
Yes about 5 months since last visit. Would like her to follow up within a month. 3-4 weeks would be ok.

## 2016-12-24 NOTE — Telephone Encounter (Signed)
Pt due for follow please schedule appointment.

## 2016-12-25 NOTE — Telephone Encounter (Signed)
Pt schedule for  December 30, 2016 at 9:00 am.

## 2016-12-30 ENCOUNTER — Encounter: Payer: Self-pay | Admitting: Medical

## 2016-12-30 ENCOUNTER — Ambulatory Visit (INDEPENDENT_AMBULATORY_CARE_PROVIDER_SITE_OTHER): Payer: BLUE CROSS/BLUE SHIELD | Admitting: Medical

## 2016-12-30 ENCOUNTER — Telehealth: Payer: Self-pay | Admitting: Medical

## 2016-12-30 VITALS — BP 140/88 | HR 61 | Temp 98.2°F | Resp 16 | Ht 62.0 in | Wt 182.2 lb

## 2016-12-30 DIAGNOSIS — I10 Essential (primary) hypertension: Secondary | ICD-10-CM

## 2016-12-30 DIAGNOSIS — M8580 Other specified disorders of bone density and structure, unspecified site: Secondary | ICD-10-CM | POA: Diagnosis not present

## 2016-12-30 DIAGNOSIS — E785 Hyperlipidemia, unspecified: Secondary | ICD-10-CM | POA: Diagnosis not present

## 2016-12-30 LAB — COMPLETE METABOLIC PANEL WITH GFR
ALT: 17 U/L (ref 6–29)
AST: 20 U/L (ref 10–35)
Albumin: 4.1 g/dL (ref 3.6–5.1)
Alkaline Phosphatase: 77 U/L (ref 33–130)
BILIRUBIN TOTAL: 0.3 mg/dL (ref 0.2–1.2)
BUN: 18 mg/dL (ref 7–25)
CO2: 26 mmol/L (ref 20–31)
CREATININE: 0.79 mg/dL (ref 0.50–0.99)
Calcium: 9.1 mg/dL (ref 8.6–10.4)
Chloride: 102 mmol/L (ref 98–110)
GFR, EST NON AFRICAN AMERICAN: 77 mL/min (ref >=60)
GFR, Est African American: 88 mL/min (ref >=60)
GLUCOSE: 194 mg/dL — AB (ref 65–99)
Potassium: 3.9 mmol/L (ref 3.5–5.3)
SODIUM: 138 mmol/L (ref 135–146)
TOTAL PROTEIN: 7.1 g/dL (ref 6.1–8.1)

## 2016-12-30 LAB — LIPID PANEL
CHOL/HDL RATIO: 4
Cholesterol: 202 mg/dL — ABNORMAL HIGH (ref 0–200)
HDL: 49.4 mg/dL (ref >39.00)
LDL Cholesterol: 131 mg/dL — ABNORMAL HIGH (ref 0–99)
NONHDL: 152.18
Triglycerides: 105 mg/dL (ref 0.0–149.0)
VLDL: 21 mg/dL (ref 0.0–40.0)

## 2016-12-30 MED ORDER — ATORVASTATIN CALCIUM 20 MG PO TABS: 20.0000 mg | ORAL_TABLET | Freq: Every day | ORAL | 3 refills | Status: AC

## 2016-12-30 NOTE — Patient Instructions (Signed)
Your bp is borderline today. Please avoid high salt foods. Keep checking bp at home. Your bp are better. Considering some white coat htn component as well continue same bp meds.  For osteopenia continue fosamax and will get vitamin d level today.  For high cholesterol will check lipid panel today and cmp.  Follow up in 3 months or as needed.(on follow up flu vaccine as well)

## 2016-12-30 NOTE — Progress Notes (Signed)
Subjective:    Patient ID: Cynthia Mata, female    DOB: 1946/03/18, 71 y.o.   MRN: [id number]  HPI  Pt in with high bp today initialy when first checked then bp came down. She states ate soup yesterday with a lot of salt. No neurologic or cardiac symptoms. Pt states other day bp better around 140 systolic. Diastolic usually 82-95.(AOZ checks bp often and consistently in 140/80 range per her report.  Pt has osteopenia and has been on fosamax. Will get vitamin d level checked today.  Pt has mild hyperlipidemia and will check lipid panel today. She is fasting.  Pt is seeing endocrinologist for diabetes     Review of Systems  Constitutional: Negative for chills, fatigue and fever.  Respiratory: Negative for cough, chest tightness, shortness of breath and wheezing.   Cardiovascular: Negative for chest pain and palpitations.  Gastrointestinal: Negative for abdominal pain.  Genitourinary: Negative for dysuria.  Musculoskeletal: Negative for back pain, joint swelling and neck stiffness.  Skin: Negative for rash.  Neurological: Negative for dizziness, weakness, light-headedness and headaches.  Hematological: Negative for adenopathy. Does not bruise/bleed easily.  Psychiatric/Behavioral: Negative for behavioral problems and confusion.    Past Medical History:  Diagnosis Date  . Anemia   . Breast cyst   . Cataract   . Diabetes mellitus without complication (HCC)   . Hyperlipidemia   . Hypertension   . Macular degeneration   . Osteoporosis   . Thyroid disease   . Trapezius strain 10/23/2014     Social History   Social History  . Marital status: Married    Spouse name: N/A  . Number of children: 5  . Years of education: N/A   Occupational History  . Not on file.   Social History Main Topics  . Smoking status: Never Smoker  . Smokeless tobacco: Never Used  . Alcohol use No  . Drug use: No  . Sexual activity: Not on file   Other Topics Concern  . Not on  file   Social History Narrative   Lives with daughter.      Past Surgical History:  Procedure Laterality Date  . ANKLE FRACTURE SURGERY     Right ankle  . CATARACT EXTRACTION    . cataracts  05-2015  . TONSILLECTOMY     Abcess removed also    Family History  Problem Relation Age of Onset  . Hypertension Maternal Aunt   . Heart disease Maternal Aunt   . CAD Maternal Grandfather   . Diabetes Neg Hx   . COPD Neg Hx     No Known Allergies  Current Outpatient Prescriptions on File Prior to Visit  Medication Sig Dispense Refill  . alendronate (FOSAMAX) 10 MG tablet Take 1 tablet (10 mg total) by mouth daily before breakfast. Take with a full glass of water on an empty stomach. 30 tablet 11  . atorvastatin (LIPITOR) 10 MG tablet Take 1 tablet (10 mg total) by mouth daily. 30 tablet 2  . azelastine (ASTELIN) 0.1 % nasal spray Place 2 sprays into both nostrils 2 (two) times daily. Use in each nostril as directed 30 mL 3  . empagliflozin (JARDIANCE) 25 MG TABS tablet Take 25 mg by mouth daily. 30 tablet 2  . insulin aspart (NOVOLOG FLEXPEN) 100 UNIT/ML FlexPen Inject 6 units with breakfast and lunch and 8 units with supper 15 mL 2  . Insulin Glargine (TOUJEO SOLOSTAR) 300 UNIT/ML SOPN Inject 34 Units into the skin daily.  15 mL 2  . Insulin Pen Needle (NOVOFINE) 32G X 6 MM MISC USE TO INJECT INSULIN FOUR TIMES DAILY 150 each 3  . meclizine (ANTIVERT) 25 MG tablet Take 1 tablet (25 mg total) by mouth 3 (three) times daily as needed for dizziness. 15 tablet 1  . metFORMIN (GLUCOPHAGE-XR) 500 MG 24 hr tablet Take 3 tablets (1,500 mg total) by mouth daily with supper. 90 tablet 3  . NIFEdipine (NIFEDIAC CC) 60 MG 24 hr tablet Take 1 tablet (60 mg total) by mouth daily. 30 tablet 5  . olmesartan-hydrochlorothiazide (BENICAR HCT) 40-25 MG tablet TAKE 1 TABLET BY MOUTH DAILY. 30 tablet 3  . ONE TOUCH ULTRA TEST test strip Use to check blood sugar 3 times per day dx code E11.9 100 each 3  .  polyethylene glycol powder (MIRALAX) powder Take 17 g by mouth daily. Mix in 4 - 8 oz of liquid. 255 g 0   No current facility-administered medications on file prior to visit.     BP (!) 171/64   Pulse 61   Temp 98.2 F (36.8 C) (Oral)   Resp 16   Ht 5\' 2"  (1.575 m)   Wt 182 lb 3.2 oz (82.6 kg)   SpO2 100%   BMI 33.32 kg/m       Objective:   Physical Exam  General Mental Status- Alert. General Appearance- Not in acute distress.   Skin General: Color- Normal Color. Moisture- Normal Moisture.  Neck Carotid Arteries- Normal color. Moisture- Normal Moisture. No carotid bruits. No JVD.  Chest and Lung Exam Auscultation: Breath Sounds:-Normal.  Cardiovascular Auscultation:Rythm- Regular. Murmurs & Other Heart Sounds:Auscultation of the heart reveals- No Murmurs.  Abdomen Inspection:-Inspeection Normal. Palpation/Percussion:Note:No mass. Palpation and Percussion of the abdomen reveal- Non Tender, Non Distended + BS, no rebound or guarding.   Neurologic Cranial Nerve exam:- CN III-XII intact(No nystagmus), symmetric smile. Strength:- 5/5 equal and symmetric strength both upper and lower extremities.      Assessment & Plan:  Your bp is borderline today. Please avoid high salt foods. Keep checking bp at home. Your bp are better. Considering some white coat htn component as well continue same bp meds.  For osteopenia continue fosamax and will get vitamin d level today.  For high cholesterol will check lipid panel today and cmp.  Follow up in 3 months or as needed.(on follow up flu vaccine as well)  Melodi Happel, Ramon DredgeEdward, PA-C

## 2016-12-30 NOTE — Telephone Encounter (Signed)
New dose/higher dose lipitor sent in.

## 2016-12-31 ENCOUNTER — Telehealth: Payer: Self-pay | Admitting: Medical

## 2016-12-31 MED FILL — ATORVASTATIN 20 MG TABLET: 20 | 30 days supply | Qty: 30 | Fill #0

## 2016-12-31 NOTE — Telephone Encounter (Signed)
-----   Message from Esperanza RichtersEdward Saguier, PA-C sent at 12/30/2016  9:55 PM EDT ----- Cholesterol is still little high. Not much higher but with hypertension and diabetes want her to have tighter control of lipids. She is low dose lipitor. I want to increase dose. Repeat lipid panel in October. Come in fasting. Will send higher dose lipitor to her pharmacy.

## 2016-12-31 NOTE — Telephone Encounter (Signed)
Pt tel [phone]  Pt was called and was informed about her results, pt wants to know also how did her results come out with Vitamin D and about her kidneys results. Pt was informed to pick up her meds and pt stated [place] during the month of Oct and November. Pt will call back to schedule her next lab appt when she returns. Please advise.

## 2017-01-02 LAB — VITAMIN D 1,25 DIHYDROXY
Vitamin D 1, 25 (OH)2 Total: 39 pg/mL (ref 18–72)
Vitamin D3 1, 25 (OH)2: 39 pg/mL

## 2017-01-05 ENCOUNTER — Telehealth: Payer: Self-pay | Admitting: Medical

## 2017-01-05 NOTE — Telephone Encounter (Signed)
-----   Message from Esperanza RichtersEdward Saguier, PA-C sent at 01/03/2017  2:36 PM EDT ----- Vitamin D level is good. Sugar 194 day of exam. Kidney function good.

## 2017-01-05 NOTE — Telephone Encounter (Signed)
Pt was informed and understood.

## 2017-01-06 NOTE — Telephone Encounter (Signed)
Pt.notified

## 2017-01-16 ENCOUNTER — Other Ambulatory Visit: Payer: Self-pay | Admitting: Endocrinology

## 2017-01-16 ENCOUNTER — Other Ambulatory Visit: Payer: Self-pay

## 2017-01-16 ENCOUNTER — Telehealth: Payer: Self-pay | Admitting: Endocrinology

## 2017-01-16 MED ORDER — INSULIN GLARGINE 300 UNIT/ML ~~LOC~~ SOPN: 34.0000 [IU] | PEN_INJECTOR | Freq: Every day | SUBCUTANEOUS | 0 refills | Status: DC

## 2017-01-16 MED FILL — TOUJEO SOLOSTAR 300 UNITS/M: 300 | 80 days supply | Qty: 9 | Fill #0

## 2017-01-16 NOTE — Telephone Encounter (Signed)
Please advise if okay to refill. It looks like the patient was never scheduled for a follow up visit.

## 2017-01-16 NOTE — Telephone Encounter (Signed)
Called daughter Delia HeadyLuisa and let her know that I have sent in a 30 day prescription for her Toujeo and to please call us back to make a follow up appointment with Dr. Lucianne MussKumar.

## 2017-01-16 NOTE — Telephone Encounter (Signed)
MEDICATION: Tujeo Solostar 300 unit/ML SOPN  [facility] [place], [id number]    IS THIS A 90 DAY SUPPLY : not sure (gets two pens)  IS PATIENT OUT OF MEDICTAION: yes  IF NOT; HOW MUCH IS LEFT:   LAST APPOINTMENT DATE: 06/20/2016  NEXT APPOINTMENT DATE: n/a  OTHER COMMENTS: Patient's daughter Cynthia Mata calling and wants to know if she can do 90 day supply for Tujeo. Would like a call back to discuss.    **Let patient know to contact pharmacy at the end of the day to make sure medication is ready. **  ** Please notify patient to allow 48-72 hours to process**  **Encourage patient to contact the pharmacy for refills or they can [facility]**

## 2017-01-16 NOTE — Telephone Encounter (Signed)
She needs only a 30 day supply until I see her

## 2017-01-20 MED FILL — ALENDRONATE NA 10 MG TAB: 10 | 30 days supply | Qty: 30 | Fill #2

## 2017-01-30 MED FILL — ATORVASTATIN 20 MG TABLET: 20 | 30 days supply | Qty: 30 | Fill #1

## 2017-01-30 MED FILL — OLMESARTAN-HCTZ 40-25 MG TA: 40-25 | 30 days supply | Qty: 30 | Fill #1

## 2017-01-30 MED FILL — NOVOLOG FLEXPEN SYRINGE: 100 | 75 days supply | Qty: 15 | Fill #1

## 2017-02-11 ENCOUNTER — Other Ambulatory Visit (INDEPENDENT_AMBULATORY_CARE_PROVIDER_SITE_OTHER): Payer: BLUE CROSS/BLUE SHIELD

## 2017-02-11 DIAGNOSIS — E1165 Type 2 diabetes mellitus with hyperglycemia: Secondary | ICD-10-CM

## 2017-02-11 DIAGNOSIS — Z794 Long term (current) use of insulin: Secondary | ICD-10-CM | POA: Diagnosis not present

## 2017-02-11 DIAGNOSIS — E782 Mixed hyperlipidemia: Secondary | ICD-10-CM

## 2017-02-11 LAB — HEMOGLOBIN A1C: HEMOGLOBIN A1C: 9 % — AB (ref 4.6–6.5)

## 2017-02-11 LAB — COMPREHENSIVE METABOLIC PANEL
ALT: 16 U/L (ref 0–35)
AST: 19 U/L (ref 0–37)
Albumin: 3.9 g/dL (ref 3.5–5.2)
Alkaline Phosphatase: 67 U/L (ref 39–117)
BUN: 19 mg/dL (ref 6–23)
CALCIUM: 9.4 mg/dL (ref 8.4–10.5)
CHLORIDE: 101 meq/L (ref 96–112)
CO2: 32 mEq/L (ref 19–32)
CREATININE: 0.9 mg/dL (ref 0.40–1.20)
GFR: 65.8 mL/min (ref >60.00)
GLUCOSE: 147 mg/dL — AB (ref 70–99)
Potassium: 3.6 mEq/L (ref 3.5–5.1)
Sodium: 139 mEq/L (ref 135–145)
Total Bilirubin: 0.3 mg/dL (ref 0.2–1.2)
Total Protein: 6.9 g/dL (ref 6.0–8.3)

## 2017-02-11 LAB — LIPID PANEL
CHOL/HDL RATIO: 4
Cholesterol: 141 mg/dL (ref 0–200)
HDL: 39.7 mg/dL (ref >39.00)
LDL CALC: 86 mg/dL (ref 0–99)
NONHDL: 101.77
TRIGLYCERIDES: 81 mg/dL (ref 0.0–149.0)
VLDL: 16.2 mg/dL (ref 0.0–40.0)

## 2017-02-16 ENCOUNTER — Encounter: Payer: Self-pay | Admitting: Endocrinology

## 2017-02-16 ENCOUNTER — Ambulatory Visit (INDEPENDENT_AMBULATORY_CARE_PROVIDER_SITE_OTHER): Payer: BLUE CROSS/BLUE SHIELD | Admitting: Endocrinology

## 2017-02-16 ENCOUNTER — Other Ambulatory Visit: Payer: Self-pay

## 2017-02-16 VITALS — BP 140/84 | HR 76 | Ht 62.0 in | Wt 182.2 lb

## 2017-02-16 DIAGNOSIS — I10 Essential (primary) hypertension: Secondary | ICD-10-CM | POA: Diagnosis not present

## 2017-02-16 DIAGNOSIS — Z794 Long term (current) use of insulin: Secondary | ICD-10-CM | POA: Diagnosis not present

## 2017-02-16 DIAGNOSIS — E669 Obesity, unspecified: Secondary | ICD-10-CM | POA: Diagnosis not present

## 2017-02-16 DIAGNOSIS — E782 Mixed hyperlipidemia: Secondary | ICD-10-CM

## 2017-02-16 DIAGNOSIS — E1165 Type 2 diabetes mellitus with hyperglycemia: Secondary | ICD-10-CM

## 2017-02-16 LAB — GLUCOSE, POCT (MANUAL RESULT ENTRY): POC GLUCOSE: 245 mg/dL — AB (ref 70–99)

## 2017-02-16 MED ORDER — METFORMIN HCL ER 500 MG PO TB24: 1500.0000 mg | ORAL_TABLET | Freq: Every day | ORAL | 3 refills | Status: DC

## 2017-02-16 MED ORDER — INSULIN PEN NEEDLE 32G X 6 MM MISC: 3 refills | Status: DC

## 2017-02-16 MED ORDER — GLUCOSE BLOOD VI STRP: ORAL_STRIP | 3 refills | Status: AC

## 2017-02-16 MED ORDER — INSULIN ASPART 100 UNIT/ML FLEXPEN: PEN_INJECTOR | SUBCUTANEOUS | 1 refills | Status: DC

## 2017-02-16 MED ORDER — INSULIN GLARGINE 300 UNIT/ML ~~LOC~~ SOPN: 34.0000 [IU] | PEN_INJECTOR | Freq: Every day | SUBCUTANEOUS | 1 refills | Status: DC

## 2017-02-16 MED FILL — METFORMIN HCL ER 500 MG TAB: 500 | 90 days supply | Qty: 270 | Fill #0

## 2017-02-16 MED FILL — TECHLITE PEN NDL 32GX1/4: 32G X 6 MM | 75 days supply | Qty: 300 | Fill #0

## 2017-02-16 MED FILL — TECHLITE PEN NDL 32GX1/4": 32G X 6 MM | 75 days supply | Qty: 300 | Fill #0

## 2017-02-16 NOTE — Patient Instructions (Signed)
Take 10 units Novolog at Bfst, 12 at lunch and 14 at supper  Check blood sugars on waking up  5/7  Also check blood sugars about 2 hours after a meal and do this after different meals by rotation  Recommended blood sugar levels on waking up is 90-130 and about 2 hours after meal is 130-160  Please bring your blood sugar monitor to each visit, thank you

## 2017-02-16 NOTE — Progress Notes (Signed)
Patient ID: Cynthia Mata, female   DOB: 08-05-1945, 71 y.o.   MRN: [id number]           Reason for Appointment: Follow-up for Type 2 Diabetes  Referring physician: Saguier  History of Present Illness:          Diagnosis: Type 2 diabetes mellitus, date of diagnosis: 2001        Past history: At the time of diagnosis she had symptoms of increased thirst and urination and very high blood sugars, was obese.   She [place] with oral medications possibly glipizide and metformin She also changed her diet and was able to lose weight  However she does not think her blood sugars had been consistently controlled In 2012 she was given Levemir insulin but not clear if this helped her sugar control as her A1c was still 9.5 in 2014 She was apparently continued on Metaglip also which he had been taking for a while; she has usually taken only 1 tablet twice a day of the 5/500 tablets.  Has not had any abdominal pain or diarrhea with metformin previously  Recent history:   INSULIN regimen is described as:Toujeo 34 in am. Novolog 0-8-10 units daily Oral hypoglycemic drugs the patient is taking are: Jardiance 25 mg daily, Metformin 500bid  She has not been seen in follow-up since 06/2016 as she appointment.   Her A1c has gone up to 9%  Current management, blood sugar patterns and problems:  She did not call her prescription for Jardiance and stopped this couple of months ago  She claimed that this was making her feel dizzy but did not report this or have any documented change in blood pressure  She still feels somewhat dizzy with her blood pressure medicines she claims  She does not appear to be very motivated to get her blood sugars under control and they are mostly running over 200 except in the morning  She has gained a little weight  She now says that she is not taking her NovoLog to cover her breakfast even though she has some carbohydrate at bedtime,  does not understand need to cover all meals with insulin  Blood sugars are highest at bedtime            Side effects from medications have been: None  Compliance with the medical regimen: Fair Hypoglycemia: none   Glucose monitoring:  done 2-3 times a day         Glucometer: One Touch.      Blood Glucose readings by recall    PRE-MEAL Fasting Lunch Dinner Bedtime Overall  Glucose range:  200 270 300   Mean/median: 140        Self-care: The diet that the patient has been following is: tries to limit Sweets, carbs and fats and more vegetables .     Meals: 3 meals per day. Breakfast is eggs, beans and/or bread  .   Has chicken and vegetables for lunch and dinner with some tortillas.  Has bread and nuts for snacks    Dinner is at 6-7 pm          Exercise:  walking 4 times a week      Dietician visit, most recent: Never .               Weight history:  Wt Readings from Last 3 Encounters:  02/16/17 182 lb 3.2 oz (82.6 kg)  12/30/16 182 lb 3.2 oz (82.6  kg)  08/04/16 177 lb 8 oz (80.5 kg)    Glycemic control:   Lab Results  Component Value Date   HGBA1C 9.0 (H) 02/11/2017   HGBA1C 7.9 (H) 06/17/2016   HGBA1C 7.8 (H) 04/11/2016   Lab Results  Component Value Date   MICROALBUR 2.4 (H) 04/11/2016   LDLCALC 86 02/11/2017   CREATININE 0.90 02/11/2017       Allergies as of 02/16/2017   No Known Allergies     Medication List       Accurate as of 02/16/17  9:41 PM. Always use your most recent med list.          alendronate 10 MG tablet Commonly known as:  FOSAMAX Take 1 tablet (10 mg total) by mouth daily before breakfast. Take with a full glass of water on an empty stomach.   atorvastatin 20 MG tablet Commonly known as:  LIPITOR Take 1 tablet (20 mg total) by mouth daily.   azelastine 0.1 % nasal spray Commonly known as:  ASTELIN Place 2 sprays into both nostrils 2 (two) times daily. Use in each nostril as directed   empagliflozin 25 MG Tabs  tablet Commonly known as:  JARDIANCE Take 25 mg by mouth daily.   insulin aspart 100 UNIT/ML FlexPen Commonly known as:  NOVOLOG FLEXPEN Inject 6 units with breakfast and lunch and 8 units with supper   Insulin Glargine 300 UNIT/ML Sopn Commonly known as:  TOUJEO SOLOSTAR Inject 34 Units into the skin daily.   Insulin Pen Needle 32G X 6 MM Misc Commonly known as:  NOVOFINE USE TO INJECT INSULIN FOUR TIMES DAILY   meclizine 25 MG tablet Commonly known as:  ANTIVERT Take 1 tablet (25 mg total) by mouth 3 (three) times daily as needed for dizziness.   metFORMIN 500 MG 24 hr tablet Commonly known as:  GLUCOPHAGE-XR Take 3 tablets (1,500 mg total) by mouth daily with supper.   NIFEdipine 60 MG 24 hr tablet Commonly known as:  NIFEDIAC CC Take 1 tablet (60 mg total) by mouth daily.   olmesartan-hydrochlorothiazide 40-25 MG tablet Commonly known as:  BENICAR HCT TAKE 1 TABLET BY MOUTH DAILY.   ONE TOUCH ULTRA TEST test strip Generic drug:  glucose blood Use to check blood sugar 3 times per day dx code E11.9   glucose blood test strip Commonly known as:  ONETOUCH VERIO Use to check blood sugars 3 times daily. Dx Code E11.9   polyethylene glycol powder powder Commonly known as:  MIRALAX Take 17 g by mouth daily. Mix in 4 - 8 oz of liquid.            Discharge Care Instructions        Start     Ordered   02/16/17 0000  POCT glucose (manual entry)     02/16/17 1622      Allergies: No Known Allergies  Past Medical History:  Diagnosis Date  . Anemia   . Breast cyst   . Cataract   . Diabetes mellitus without complication (HCC)   . Hyperlipidemia   . Hypertension   . Macular degeneration   . Osteoporosis   . Thyroid disease   . Trapezius strain 10/23/2014    Past Surgical History:  Procedure Laterality Date  . ANKLE FRACTURE SURGERY     Right ankle  . CATARACT EXTRACTION    . cataracts  05-2015  . TONSILLECTOMY     Abcess removed also    Family  History  Problem  Relation Age of Onset  . Hypertension Maternal Aunt   . Heart disease Maternal Aunt   . CAD Maternal Grandfather   . Diabetes Neg Hx   . COPD Neg Hx     Social History:  reports that she has never smoked. She has never used smokeless tobacco. She reports that she does not drink alcohol or use drugs.    Review of Systems    Most recent eye exam was In 2/18, She does have glaucoma       Lipids: On  Rx with Lipitor    LDL is improved       Lab Results  Component Value Date   CHOL 141 02/11/2017   HDL 39.70 02/11/2017   LDLCALC 86 02/11/2017   TRIG 81.0 02/11/2017   CHOLHDL 4 02/11/2017                    The blood pressure has been high for 15 years treated with HCTZ and Procardia  by PCP  LABS:  Office Visit on 02/16/2017  Component Date Value Ref Range Status  . POC Glucose 02/16/2017 245* 70 - 99 mg/dl Final  Lab on 40/98/1191  Component Date Value Ref Range Status  . Hgb A1c MFr Bld 02/11/2017 9.0* 4.6 - 6.5 % Final   Glycemic Control Guidelines for People with Diabetes:Non Diabetic:  <6%Goal of Therapy: <7%Additional Action Suggested:  >8%   . Sodium 02/11/2017 139  135 - 145 mEq/L Final  . Potassium 02/11/2017 3.6  3.5 - 5.1 mEq/L Final  . Chloride 02/11/2017 101  96 - 112 mEq/L Final  . CO2 02/11/2017 32  19 - 32 mEq/L Final  . Glucose, Bld 02/11/2017 147* 70 - 99 mg/dL Final  . BUN 47/82/9562 19  6 - 23 mg/dL Final  . Creatinine, Ser 02/11/2017 0.90  0.40 - 1.20 mg/dL Final  . Total Bilirubin 02/11/2017 0.3  0.2 - 1.2 mg/dL Final  . Alkaline Phosphatase 02/11/2017 67  39 - 117 U/L Final  . AST 02/11/2017 19  0 - 37 U/L Final  . ALT 02/11/2017 16  0 - 35 U/L Final  . Total Protein 02/11/2017 6.9  6.0 - 8.3 g/dL Final  . Albumin 13/01/6577 3.9  3.5 - 5.2 g/dL Final  . Calcium 46/96/2952 9.4  8.4 - 10.5 mg/dL Final  . GFR 84/13/2440 65.80  >60.00 mL/min Final  . Cholesterol 02/11/2017 141  0 - 200 mg/dL Final   ATP III Classification        Desirable:  < 200 mg/dL               Borderline High:  200 - 239 mg/dL          High:  > = 102 mg/dL  . Triglycerides 02/11/2017 81.0  0.0 - 149.0 mg/dL Final   Normal:  <725 mg/dLBorderline High:  150 - 199 mg/dL  . HDL 02/11/2017 39.70  >39.00 mg/dL Final  . VLDL 36/64/4034 16.2  0.0 - 40.0 mg/dL Final  . LDL Cholesterol 02/11/2017 86  0 - 99 mg/dL Final  . Total CHOL/HDL Ratio 02/11/2017 4   Final                  Men          Women1/2 Average Risk     3.4          3.3Average Risk          5.0  4.42X Average Risk          9.6          7.13X Average Risk          15.0          11.0                      . NonHDL 02/11/2017 101.77   Final   NOTE:  Non-HDL goal should be 30 mg/dL higher than patient's LDL goal (i.e. LDL goal of < 70 mg/dL, would have non-HDL goal of < 100 mg/dL)    Physical Examination:  BP 140/84   Pulse 76   Ht  (1.575 m)   Wt 182 lb 3.2 oz (82.6 kg)   SpO2 95%   BMI 33.32 kg/m      ASSESSMENT/PLAN:  Diabetes type 2, uncontrolled with obesity and BMI 34  See history of present illness for current blood sugar patterns, problems identified and current management  A1c is still high at 9  He is not taking Jardiance which is likely helping Also not getting enough mealtime Although she is likely to benefit from consultation with dietitian she claims that this was not useful in the past Has difficulty losing weight As above does not appear to understand the need to increase mealtime insulin based on what her blood sugars are Also not taking any NOVOLOG at breakfast now Unable to review her blood sugars and she is using a Generic monitor  She refuses to take any medication even though it was discussed that it drug like Victoza would be useful and is being on this works in detail Showed her detailed information on postprandial hyperglycemia Discussed need to increase NovoLog as in patient instructions Since she is going out of the [place] will  consider adjustment of her medications on her next visit Given a new Verio monitor and explained to her how to use this     HYPERTENSION: Blood pressure is relatively better  LIPIDS:  LDL below 100, to continue 20 mg Lipitor   Patient Instructions  Take 10 units Novolog at Bfst, 12 at lunch and 14 at supper  Check blood sugars on waking up  5/7  Also check blood sugars about 2 hours after a meal and do this after different meals by rotation  Recommended blood sugar levels on waking up is 90-130 and about 2 hours after meal is 130-160  Please bring your blood sugar monitor to each visit, thank you    Counseling time on subjects discussed above is over 50% of today's 25 minute visit   Lenox Ladouceur 02/16/2017, 9:41 PM   Note: This office note was prepared with Insurance underwriter. Any transcriptional errors that result from this process are unintentional.

## 2017-02-17 ENCOUNTER — Ambulatory Visit: Payer: BLUE CROSS/BLUE SHIELD | Admitting: Podiatry

## 2017-02-18 MED FILL — DORZOLAMIDE-TIMOLOL EYE DRP: 22.3-6.8 | 50 days supply | Qty: 10 | Fill #2

## 2017-02-18 MED FILL — ALENDRONATE NA 10 MG TAB: 10 | 30 days supply | Qty: 30 | Fill #3

## 2017-02-18 MED FILL — NIFEDIPINE ER 60 MG TABLET: 60 | 30 days supply | Qty: 30 | Fill #0

## 2017-03-02 MED FILL — BRIMONIDINE 0.2% EYE DROP: 0.2 | 90 days supply | Qty: 15 | Fill #1

## 2017-03-02 MED FILL — NIFEDIPINE ER 60 MG TABLET: 60 | 90 days supply | Qty: 90 | Fill #1

## 2017-03-02 MED FILL — OLMESARTAN-HCTZ 40-25 MG TA: 40-25 | 30 days supply | Qty: 30 | Fill #2

## 2017-03-02 MED FILL — ATORVASTATIN 20 MG TABLET: 20 | 30 days supply | Qty: 30 | Fill #2

## 2017-04-01 ENCOUNTER — Ambulatory Visit: Payer: BLUE CROSS/BLUE SHIELD | Admitting: Medical

## 2017-04-02 MED FILL — TIMOLOL 0.5% EYE DROPS: 0.5 | 25 days supply | Qty: 5 | Fill #1

## 2017-04-02 MED FILL — DORZOLAMIDE-TIMOLOL EYE DRP: 22.3-6.8 | 50 days supply | Qty: 10 | Fill #3

## 2017-04-02 MED FILL — NOVOLOG FLEXPEN SYRINGE: 100 | 75 days supply | Qty: 15 | Fill #0

## 2017-04-02 MED FILL — ALENDRONATE NA 10 MG TAB: 10 | 30 days supply | Qty: 30 | Fill #4

## 2017-04-02 MED FILL — OLMESARTAN-HCTZ 40-25 MG TA: 40-25 | 30 days supply | Qty: 30 | Fill #3

## 2017-04-02 MED FILL — TOUJEO SOLOSTAR 300 UNITS/M: 300 | 30 days supply | Qty: 5 | Fill #0

## 2017-05-29 MED FILL — DORZOLAMIDE-TIMOLOL EYE DRP: 22.3-6.8 | 50 days supply | Qty: 10 | Fill #0

## 2017-05-29 MED FILL — BRIMONIDINE 0.2% EYE DROP: 0.2 | 25 days supply | Qty: 5 | Fill #0

## 2017-06-12 ENCOUNTER — Other Ambulatory Visit: Payer: BLUE CROSS/BLUE SHIELD

## 2017-06-18 ENCOUNTER — Ambulatory Visit: Payer: BLUE CROSS/BLUE SHIELD | Admitting: Endocrinology

## 2017-06-19 MED FILL — METFORMIN HCL ER 500 MG TAB: 500 | 90 days supply | Qty: 270 | Fill #1

## 2017-07-03 MED FILL — TOUJEO SOLOSTAR 300 UNITS/M: 300 | 40 days supply | Qty: 5 | Fill #1

## 2017-07-14 ENCOUNTER — Other Ambulatory Visit (INDEPENDENT_AMBULATORY_CARE_PROVIDER_SITE_OTHER): Payer: BLUE CROSS/BLUE SHIELD

## 2017-07-14 DIAGNOSIS — Z794 Long term (current) use of insulin: Secondary | ICD-10-CM

## 2017-07-14 DIAGNOSIS — E1165 Type 2 diabetes mellitus with hyperglycemia: Secondary | ICD-10-CM

## 2017-07-14 LAB — BASIC METABOLIC PANEL
BUN: 17 mg/dL (ref 6–23)
CHLORIDE: 103 meq/L (ref 96–112)
CO2: 31 mEq/L (ref 19–32)
Calcium: 9.1 mg/dL (ref 8.4–10.5)
Creatinine, Ser: 0.8 mg/dL (ref 0.40–1.20)
GFR: 75.29 mL/min (ref >60.00)
Glucose, Bld: 140 mg/dL — ABNORMAL HIGH (ref 70–99)
POTASSIUM: 3.7 meq/L (ref 3.5–5.1)
SODIUM: 140 meq/L (ref 135–145)

## 2017-07-14 LAB — HEMOGLOBIN A1C: HEMOGLOBIN A1C: 9 % — AB (ref 4.6–6.5)

## 2017-07-17 ENCOUNTER — Encounter: Payer: Self-pay | Admitting: Endocrinology

## 2017-07-17 ENCOUNTER — Ambulatory Visit (INDEPENDENT_AMBULATORY_CARE_PROVIDER_SITE_OTHER): Payer: BLUE CROSS/BLUE SHIELD | Admitting: Endocrinology

## 2017-07-17 VITALS — BP 118/80 | HR 74 | Ht 62.0 in | Wt 175.6 lb

## 2017-07-17 DIAGNOSIS — E1165 Type 2 diabetes mellitus with hyperglycemia: Secondary | ICD-10-CM | POA: Diagnosis not present

## 2017-07-17 DIAGNOSIS — Z794 Long term (current) use of insulin: Secondary | ICD-10-CM | POA: Diagnosis not present

## 2017-07-17 DIAGNOSIS — I10 Essential (primary) hypertension: Secondary | ICD-10-CM

## 2017-07-17 NOTE — Progress Notes (Signed)
Patient ID: Cynthia Mata, female   DOB: 09-Aug-1945, 72 y.o.   MRN: [id number]           Reason for Appointment: Follow-up for Type 2 Diabetes  Referring physician: Saguier  History of Present Illness:          Diagnosis: Type 2 diabetes mellitus, date of diagnosis: 2001        Past history: At the time of diagnosis she had symptoms of increased thirst and urination and very high blood sugars, was obese.   She was [place] with oral medications possibly glipizide and metformin She also changed her diet and was able to lose weight  However she does not think her blood sugars had been consistently controlled In 2012 she was given Levemir insulin but not clear if this helped her sugar control as her A1c was still 9.5 in 2014 She was apparently continued on Metaglip also which he had been taking for a while; she has usually taken only 1 tablet twice a day of the 5/500 tablets.  Has not had any abdominal pain or diarrhea with metformin previously  Recent history:   INSULIN regimen is described as:Toujeo 20 in am. Novolog 0-8-10 units daily  Oral hypoglycemic drugs the patient is taking are: Jardiance 0-25 mg daily, Metformin 500bid  She has not been seen in follow-up since 02/2017  Her A1c has gone up to 9% and is the same as last year  Current management, blood sugar patterns and problems:  She does not take NovoLog despite her blood sugars being frequently well over 200 and sometimes over 300  Also is not checking blood sugars much in the morning  Not clear why she is reluctant to take NovoLog even though her blood sugars are persistently high  Also unclear when she is checking her blood sugars since she is mostly doing them around 4-5 PM and around 8 PM and she thinks these are before eating  She did not take Jardiance previously because she felt it was making her not feel well and she could not walk with this, not clear if she was dizzy with  this  However she has recently started cutting back a little on her portions and high calorie foods but still eating carbohydrates at least at breakfast and lunch  Unclear why she also reduced her Toujeo, previously on 34 units and now taking only 20; however lab glucose was only 140 and she did have one reading of 117 in the morning  She has lost a little weight           Side effects from medications have been: None  Compliance with the medical regimen: Fair Hypoglycemia: none   Glucose monitoring:  done 2-3 times a day         Glucometer: One Touch.      Blood Glucose readings by review of monitor, unable to download    PRE-MEAL Fasting Lunch Dinner Bedtime Overall  Glucose range: 117 214-367  185-360     Mean/median:         Self-care: The diet that the patient has been following is: tries to limit Sweets, carbs and fats and more vegetables .     Meals: 3 meals per day. Breakfast is eggs, beans and/or bread  .   Has chicken and vegetables for lunch and dinner with some tortillas.  Has bread and nuts for snacks    Dinner is at 6-7 pm  Exercise:  walking 2-4 times a week       Dietician visit, most recent: 2016.               Weight history:  Wt Readings from Last 3 Encounters:  07/17/17 175 lb 9.6 oz (79.7 kg)  02/16/17 182 lb 3.2 oz (82.6 kg)  12/30/16 182 lb 3.2 oz (82.6 kg)    Glycemic control:   Lab Results  Component Value Date   HGBA1C 9.0 (H) 07/14/2017   HGBA1C 9.0 (H) 02/11/2017   HGBA1C 7.9 (H) 06/17/2016   Lab Results  Component Value Date   MICROALBUR 2.4 (H) 04/11/2016   LDLCALC 86 02/11/2017   CREATININE 0.80 07/14/2017       Allergies as of 07/17/2017   No Known Allergies     Medication List        Accurate as of 07/17/17 10:24 AM. Always use your most recent med list.          alendronate 10 MG tablet Commonly known as:  FOSAMAX Take 1 tablet (10 mg total) by mouth daily before breakfast. Take with a full glass of water on  an empty stomach.   atorvastatin 20 MG tablet Commonly known as:  LIPITOR Take 1 tablet (20 mg total) by mouth daily.   azelastine 0.1 % nasal spray Commonly known as:  ASTELIN Place 2 sprays into both nostrils 2 (two) times daily. Use in each nostril as directed   insulin aspart 100 UNIT/ML FlexPen Commonly known as:  NOVOLOG FLEXPEN Inject 6 units with breakfast and lunch and 8 units with supper   Insulin Glargine 300 UNIT/ML Sopn Commonly known as:  TOUJEO SOLOSTAR Inject 34 Units into the skin daily.   Insulin Pen Needle 32G X 6 MM Misc Commonly known as:  NOVOFINE USE TO INJECT INSULIN FOUR TIMES DAILY   meclizine 25 MG tablet Commonly known as:  ANTIVERT Take 1 tablet (25 mg total) by mouth 3 (three) times daily as needed for dizziness.   metFORMIN 500 MG 24 hr tablet Commonly known as:  GLUCOPHAGE-XR Take 3 tablets (1,500 mg total) by mouth daily with supper.   NIFEdipine 60 MG 24 hr tablet Commonly known as:  NIFEDIAC CC Take 1 tablet (60 mg total) by mouth daily.   olmesartan-hydrochlorothiazide 40-25 MG tablet Commonly known as:  BENICAR HCT TAKE 1 TABLET BY MOUTH DAILY.   ONE TOUCH ULTRA TEST test strip Generic drug:  glucose blood Use to check blood sugar 3 times per day dx code E11.9   glucose blood test strip Commonly known as:  ONETOUCH VERIO Use to check blood sugars 3 times daily. Dx Code E11.9   polyethylene glycol powder powder Commonly known as:  MIRALAX Take 17 g by mouth daily. Mix in 4 - 8 oz of liquid.       Allergies: No Known Allergies  Past Medical History:  Diagnosis Date  . Anemia   . Breast cyst   . Cataract   . Diabetes mellitus without complication (HCC)   . Hyperlipidemia   . Hypertension   . Macular degeneration   . Osteoporosis   . Thyroid disease   . Trapezius strain 10/23/2014    Past Surgical History:  Procedure Laterality Date  . ANKLE FRACTURE SURGERY     Right ankle  . CATARACT EXTRACTION    . cataracts   05-2015  . TONSILLECTOMY     Abcess removed also    Family History  Problem Relation Age of  Onset  . Hypertension Maternal Aunt   . Heart disease Maternal Aunt   . CAD Maternal Grandfather   . Diabetes Neg Hx   . COPD Neg Hx     Social History:  reports that  has never smoked. she has never used smokeless tobacco. She reports that she does not drink alcohol or use drugs.    Review of Systems    Most recent eye exam was In 2/18, She does have glaucoma       Lipids: On  Rx with Lipitor 20mg    LDL is improved       Lab Results  Component Value Date   CHOL 141 02/11/2017   HDL 39.70 02/11/2017   LDLCALC 86 02/11/2017   TRIG 81.0 02/11/2017   CHOLHDL 4 02/11/2017                    The blood pressure has been high for 15 years treated with Benicar HCTand Procardia  by PCP  LABS:  Lab on 07/14/2017  Component Date Value Ref Range Status  . Sodium 07/14/2017 140  135 - 145 mEq/L Final  . Potassium 07/14/2017 3.7  3.5 - 5.1 mEq/L Final  . Chloride 07/14/2017 103  96 - 112 mEq/L Final  . CO2 07/14/2017 31  19 - 32 mEq/L Final  . Glucose, Bld 07/14/2017 140* 70 - 99 mg/dL Final  . BUN 14/78/2956 17  6 - 23 mg/dL Final  . Creatinine, Ser 07/14/2017 0.80  0.40 - 1.20 mg/dL Final  . Calcium 21/30/8657 9.1  8.4 - 10.5 mg/dL Final  . GFR 84/69/6295 75.29  >60.00 mL/min Final  . Hgb A1c MFr Bld 07/14/2017 9.0* 4.6 - 6.5 % Final   Glycemic Control Guidelines for People with Diabetes:Non Diabetic:  <6%Goal of Therapy: <7%Additional Action Suggested:  >8%     Physical Examination:  BP 118/80 (BP Location: Left Arm, Patient Position: Sitting, Cuff Size: Normal)   Pulse 74   Ht 5\' 2"  (1.575 m)   Wt 175 lb 9.6 oz (79.7 kg)   SpO2 98%   BMI 32.12 kg/m      ASSESSMENT/PLAN:  Diabetes type 2, uncontrolled with obesity and insulin dependence  See history of present illness for current blood sugar patterns, problems identified and current management  A1c is still high at  9  She has recently persistently high blood sugars when documented at home She does not understand that her blood sugars will go up with meals and she is only focusing on blood sugar before eating She may occasionally take NovoLog when blood sugars are very high but not checking readings after meals to see the response  Although she does have Jardiance at home she has not taken this because of possible lightheadedness with this She thinks she is trying to do better on her diet and does not want to go back to the dietitian again Currently not checking fasting readings except very rarely  She does appear to have lost a little weight and requiring less basal insulin Emphasized again the need to use medication to help her lose weight and not just insulin as well as other long-term benefits of Jardiance  Have given the patient a handout on postprandial hyperglycemia and mealtime insulin until his blood sugar targets both before and after eating  She agrees to try Jardiance at least at half tablet daily but at the same time try half tablet of Benicar HCT She will need to take at least  2 units of Novolog at breakfast, 6-7 at lunch and 3-4 dinner based on her meal size and not just the meal blood sugar Follow-up in 1 month to reassess her progress    HYPERTENSION: Blood pressure is relatively better with Benicar HCT and Procardia    Patient Instructions  Check blood sugars on waking up  2-3/7  Also check blood sugars about 2 hours after a meal and do this after different meals by rotation  Recommended blood sugar levels on waking up is 90-130 and about 2 hours after meal is 130-160  Please bring your blood sugar monitor to each visit, thank you   Novolog 4 units in am, 6-7 units at lunch  And 3-4 at dinner  Jardiance 1/2 tab daily  Benicar 1/2 daily   Counseling time on subjects discussed in assessment and plan sections is over 50% of today's 25 minute visit   Reather Littler 07/17/2017,  10:24 AM   Note: This office note was prepared with Dragon voice recognition system technology. Any transcriptional errors that result from this process are unintentional.

## 2017-07-17 NOTE — Patient Instructions (Addendum)
Check blood sugars on waking up  2-3/7  Also check blood sugars about 2 hours after a meal and do this after different meals by rotation  Recommended blood sugar levels on waking up is 90-130 and about 2 hours after meal is 130-160  Please bring your blood sugar monitor to each visit, thank you   Novolog 4 units in am, 6-7 units at lunch  And 3-4 at dinner  Jardiance 1/2 tab daily  Benicar 1/2 daily

## 2017-07-24 MED FILL — BRIMONIDINE 0.2% EYE DROP: 0.2 | 25 days supply | Qty: 5 | Fill #1

## 2017-07-24 MED FILL — DORZOLAMIDE-TIMOLOL EYE DRP: 22.3-6.8 | 50 days supply | Qty: 10 | Fill #1

## 2017-08-17 ENCOUNTER — Other Ambulatory Visit: Payer: BLUE CROSS/BLUE SHIELD

## 2017-08-17 MED FILL — TOUJEO SOLOSTAR 300 UNITS/M: 300 | 40 days supply | Qty: 5 | Fill #2

## 2017-08-18 ENCOUNTER — Other Ambulatory Visit (INDEPENDENT_AMBULATORY_CARE_PROVIDER_SITE_OTHER): Payer: BLUE CROSS/BLUE SHIELD

## 2017-08-18 DIAGNOSIS — E1165 Type 2 diabetes mellitus with hyperglycemia: Secondary | ICD-10-CM

## 2017-08-18 DIAGNOSIS — Z794 Long term (current) use of insulin: Secondary | ICD-10-CM

## 2017-08-18 LAB — BASIC METABOLIC PANEL
BUN: 16 mg/dL (ref 6–23)
CHLORIDE: 101 meq/L (ref 96–112)
CO2: 30 meq/L (ref 19–32)
CREATININE: 0.81 mg/dL (ref 0.40–1.20)
Calcium: 9.4 mg/dL (ref 8.4–10.5)
GFR: 74.2 mL/min (ref >60.00)
Glucose, Bld: 151 mg/dL — ABNORMAL HIGH (ref 70–99)
POTASSIUM: 3.9 meq/L (ref 3.5–5.1)
SODIUM: 138 meq/L (ref 135–145)

## 2017-08-19 ENCOUNTER — Other Ambulatory Visit: Payer: Self-pay | Admitting: Medical

## 2017-08-19 LAB — FRUCTOSAMINE: FRUCTOSAMINE: 336 umol/L — AB (ref 0–285)

## 2017-08-20 ENCOUNTER — Telehealth: Payer: Self-pay | Admitting: Medical

## 2017-08-20 MED ORDER — OLMESARTAN MEDOXOMIL-HCTZ 40-25 MG PO TABS: 1.0000 | ORAL_TABLET | Freq: Every day | ORAL | 3 refills | Status: DC

## 2017-08-20 MED ORDER — NIFEDIPINE ER 60 MG PO TB24: 60.0000 mg | ORAL_TABLET | Freq: Every day | ORAL | 5 refills | Status: DC

## 2017-08-20 MED FILL — NIFEdipine ER 60 MG TB24: 60 | 30 days supply | Qty: 30 | Fill #0

## 2017-08-20 NOTE — Telephone Encounter (Signed)
Ordered patient's blood pressure medications nifedipine and Benicar.  But would you please have her schedule for blood pressure check.  I think it is been about 7 months since I last saw patient.  So they need to follow-up within a month.  Please document that you advise on checkup.

## 2017-08-20 NOTE — Telephone Encounter (Signed)
Open to send prescriptions refills of BP meds.

## 2017-08-21 ENCOUNTER — Encounter: Payer: Self-pay | Admitting: Endocrinology

## 2017-08-21 ENCOUNTER — Ambulatory Visit: Payer: BLUE CROSS/BLUE SHIELD | Admitting: Endocrinology

## 2017-08-21 VITALS — BP 130/70 | HR 77 | Wt 177.0 lb

## 2017-08-21 DIAGNOSIS — Z794 Long term (current) use of insulin: Secondary | ICD-10-CM | POA: Diagnosis not present

## 2017-08-21 DIAGNOSIS — I10 Essential (primary) hypertension: Secondary | ICD-10-CM | POA: Diagnosis not present

## 2017-08-21 DIAGNOSIS — E1165 Type 2 diabetes mellitus with hyperglycemia: Secondary | ICD-10-CM

## 2017-08-21 NOTE — Progress Notes (Signed)
Patient ID: Cynthia Mata, female   DOB: 03/17/1946, 72 y.o.   MRN: [id number]           Reason for Appointment: Follow-up for Type 2 Diabetes  Referring physician: Saguier  History of Present Illness:          Diagnosis: Type 2 diabetes mellitus, date of diagnosis: 2001        Past history: At the time of diagnosis she had symptoms of increased thirst and urination and very high blood sugars, was obese.   She was [place] with oral medications possibly glipizide and metformin She also changed her diet and was able to lose weight  However she does not think her blood sugars had been consistently controlled In 2012 she was given Levemir insulin but not clear if this helped her sugar control as her A1c was still 9.5 in 2014 She was apparently continued on Metaglip also which he had been taking for a while; she has usually taken only 1 tablet twice a day of the 5/500 tablets.  Has not had any abdominal pain or diarrhea with metformin previously  Recent history:   INSULIN regimen is described as:Toujeo 20 in am. Novolog 0-5  units once or twice daily   Oral hypoglycemic drugs the patient is taking are: Metformin 500 bid  She has not been seen in follow-up since 02/2017  Her A1c has gone up to 9% and is the same as last year  Current management, blood sugar patterns and problems:  She did not bring her monitor for download today  She was advised to try Jardiance again on her last visit with cutting her Benicar in half; however she thinks when she takes it it makes her feel nervous and anxious and she will not take it  She was also told to take NovoLog with every meal regardless of pre-meal blood sugar but she is only rarely taking it  She thinks she would get low blood sugars if she takes NovoLog  However she does not check sugars AFTER meals and mostly before breakfast and dinner, does not remember her readings in the evening  Does not understand  how her blood sugar will go up after meals especially if eating carbohydrate  She is no longer is cutting back on carbohydrates and usually has some bread at breakfast and periodically will have bread and pizza at meals  Mostly trying to eat more salads and evening with chicken  Her weight has gone up  She thinks her fasting blood sugars are fairly close to normal, today 103  However lab glucose was 151 fasting when she came here           Side effects from medications have been: None  Compliance with the medical regimen: Fair Hypoglycemia: none   Glucose monitoring:  done 2-3 times a day         Glucometer: One Touch.       Blood Glucose readings by recall: As above  Previous readings were  PRE-MEAL Fasting Lunch Dinner Bedtime Overall  Glucose range: 117 214-367  185-360     Mean/median:         Self-care: The diet that the patient has been following is: tries to limit Sweets, carbs and fats and more vegetables .      Meals: 3 meals per day. Breakfast is eggs, beans and/or bread   .   Has chicken and vegetables for lunch and dinner with some tortillas.  Has bread and nuts for snacks    Dinner is at 6-7 pm          Exercise:  walking 2-3 times a week       Dietician visit, most recent: 2016.               Weight history:  Wt Readings from Last 3 Encounters:  08/21/17 177 lb (80.3 kg)  07/17/17 175 lb 9.6 oz (79.7 kg)  02/16/17 182 lb 3.2 oz (82.6 kg)    Glycemic control:   Lab Results  Component Value Date   HGBA1C 9.0 (H) 07/14/2017   HGBA1C 9.0 (H) 02/11/2017   HGBA1C 7.9 (H) 06/17/2016   Lab Results  Component Value Date   MICROALBUR 2.4 (H) 04/11/2016   LDLCALC 86 02/11/2017   CREATININE 0.81 08/18/2017       Allergies as of 08/21/2017   No Known Allergies     Medication List        Accurate as of 08/21/17  1:02 PM. Always use your most recent med list.          alendronate 10 MG tablet Commonly known as:  FOSAMAX Take 1 tablet (10 mg  total) by mouth daily before breakfast. Take with a full glass of water on an empty stomach.   atorvastatin 20 MG tablet Commonly known as:  LIPITOR Take 1 tablet (20 mg total) by mouth daily.   azelastine 0.1 % nasal spray Commonly known as:  ASTELIN Place 2 sprays into both nostrils 2 (two) times daily. Use in each nostril as directed   brimonidine 0.2 % ophthalmic solution Commonly known as:  ALPHAGAN   dorzolamide-timolol 22.3-6.8 MG/ML ophthalmic solution Commonly known as:  COSOPT   insulin aspart 100 UNIT/ML FlexPen Commonly known as:  NOVOLOG FLEXPEN Inject 6 units with breakfast and lunch and 8 units with supper   Insulin Glargine 300 UNIT/ML Sopn Commonly known as:  TOUJEO SOLOSTAR Inject 34 Units into the skin daily.   Insulin Pen Needle 32G X 6 MM Misc Commonly known as:  NOVOFINE USE TO INJECT INSULIN FOUR TIMES DAILY   meclizine 25 MG tablet Commonly known as:  ANTIVERT Take 1 tablet (25 mg total) by mouth 3 (three) times daily as needed for dizziness.   metFORMIN 500 MG 24 hr tablet Commonly known as:  GLUCOPHAGE-XR Take 3 tablets (1,500 mg total) by mouth daily with supper.   NIFEdipine 60 MG 24 hr tablet Commonly known as:  PROCARDIA XL/ADALAT-CC TAKE 1 TABLET (60 MG TOTAL) BY MOUTH DAILY.   NIFEdipine 60 MG 24 hr tablet Commonly known as:  NIFEDIAC CC Take 1 tablet (60 mg total) by mouth daily.   olmesartan-hydrochlorothiazide 40-25 MG tablet Commonly known as:  BENICAR HCT Take 1 tablet by mouth daily.   ONE TOUCH ULTRA TEST test strip Generic drug:  glucose blood Use to check blood sugar 3 times per day dx code E11.9   glucose blood test strip Commonly known as:  ONETOUCH VERIO Use to check blood sugars 3 times daily. Dx Code E11.9   polyethylene glycol powder powder Commonly known as:  MIRALAX Take 17 g by mouth daily. Mix in 4 - 8 oz of liquid.       Allergies: No Known Allergies  Past Medical History:  Diagnosis Date  . Anemia    . Breast cyst   . Cataract   . Diabetes mellitus without complication (HCC)   . Hyperlipidemia   . Hypertension   .  Macular degeneration   . Osteoporosis   . Thyroid disease   . Trapezius strain 10/23/2014    Past Surgical History:  Procedure Laterality Date  . ANKLE FRACTURE SURGERY     Right ankle  . CATARACT EXTRACTION    . cataracts  05-2015  . TONSILLECTOMY     Abcess removed also    Family History  Problem Relation Age of Onset  . Hypertension Maternal Aunt   . Heart disease Maternal Aunt   . CAD Maternal Grandfather   . Diabetes Neg Hx   . COPD Neg Hx     Social History:  reports that  has never smoked. she has never used smokeless tobacco. She reports that she does not drink alcohol or use drugs.    Review of Systems    Most recent eye exam was In 2/18, She does have glaucoma       Lipids: On  Rx with Lipitor 20mg          Lab Results  Component Value Date   CHOL 141 02/11/2017   HDL 39.70 02/11/2017   LDLCALC 86 02/11/2017   TRIG 81.0 02/11/2017   CHOLHDL 4 02/11/2017                    The blood pressure has been high for 15 years treated with Benicar HCTand Procardia  by PCP  Home BP recently about 140   she is asking about feeling off balance at times    LABS:  Lab on 08/18/2017  Component Date Value Ref Range Status  . Sodium 08/18/2017 138  135 - 145 mEq/L Final  . Potassium 08/18/2017 3.9  3.5 - 5.1 mEq/L Final  . Chloride 08/18/2017 101  96 - 112 mEq/L Final  . CO2 08/18/2017 30  19 - 32 mEq/L Final  . Glucose, Bld 08/18/2017 151* 70 - 99 mg/dL Final  . BUN 60/45/409803/05/2018 16  6 - 23 mg/dL Final  . Creatinine, Ser 08/18/2017 0.81  0.40 - 1.20 mg/dL Final  . Calcium 11/91/478203/05/2018 9.4  8.4 - 10.5 mg/dL Final  . GFR 95/62/130803/05/2018 74.20  >60.00 mL/min Final  . Fructosamine 08/18/2017 336* 0 - 285 umol/L Final   Comment: Published reference interval for apparently healthy subjects between age 71 and 1960 is 4205 - 285 umol/L and in a  poorly controlled diabetic population is 228 - 563 umol/L with a mean of 396 umol/L.     Physical Examination:  BP 130/70 (Cuff Size: Large)   Pulse 77   Wt 177 lb (80.3 kg)   SpO2 97%   BMI 32.37 kg/m      ASSESSMENT/PLAN:  Diabetes type 2, uncontrolled with obesity and insulin dependence  See history of present illness for current blood sugar patterns, problems identified and current management  Blood sugars are only slightly better as judged by her fructosamine of 336, previously A1c 9%  Despite her having blood sugars over 300 at times on last visit after meals she is not clear about whether her sugars are going up as much and did not bring a monitor She is only checking blood sugar before breakfast and dinner now  She has recently persistently high blood sugars when documented at home She does not understand that her blood sugars will go up with meals and she is only rarely taking NovoLog when eating large amount of carbohydrate or pizza  She refuses to take Jardiance as she thinks it makes her feel anxious Given her detailed  instructions on checking the blood sugar and taking NovoLog Explained to her the differences between basal and bolus insulin Also discussed need to take NovoLog before eating so that blood sugar do not go up with either higher carbohydrate or higher fat intake  She was offered consultation with dietitian again but she does not want to do this Explained to her that high fat foods including salad dressing may make her blood sugar go up  She will also try to adjust her NovoLog if blood sugars are consistently high after second meals      HYPERTENSION: Blood pressure is controlled, relatively low on the first measurement but better on the second However she will need to make sure her home monitor is accurate She will continue the half tablet of Benicar HCT for now  Talk to her PCP regarding balance difficulties   Counseling time on subjects  discussed in assessment and plan sections is over 50% of today's 25 minute visit   Patient Instructions  Check blood sugars on waking up  3/7 days  Also check blood sugars about 2 hours after a meal and do this after different meals by rotation  Recommended blood sugar levels on waking up is 90-130 and about 2 hours after meal is 130-160  Please bring your blood sugar monitor to each visit, thank you  Must take enough Novolog before eating to keep the sugar from going up over 180 at least  Walk daily   Revise los niveles de azcar en la sangre al despertar 3/7 das  Tambin revise los niveles de azcar en la sangre aproximadamente 2 horas despus de una comida y hgalo despus de diferentes comidas por rotacin  Los niveles recomendados de azcar en la sangre al levantarse son 90-130 y aproximadamente 2 horas despus de las comidas son 130-160.  Por favor traiga su monitor de azcar en la sangre a cada visita, gracias  Debe tomar suficiente Novolog antes de comer para evitar que el azcar suba a ms de 180 al menos  Caminar diariamente        Reather Littler 08/21/2017, 1:02 PM   Note: This office note was prepared with Dragon voice recognition system technology. Any transcriptional errors that result from this process are unintentional.

## 2017-08-21 NOTE — Patient Instructions (Addendum)
Check blood sugars on waking up  3/7 days  Also check blood sugars about 2 hours after a meal and do this after different meals by rotation  Recommended blood sugar levels on waking up is 90-130 and about 2 hours after meal is 130-160  Please bring your blood sugar monitor to each visit, thank you  Must take enough Novolog before eating to keep the sugar from going up over 180 at least  Walk daily   Revise los niveles de azcar en la sangre al despertar 3/7 das  Tambin revise los niveles de azcar en la sangre aproximadamente 2 horas despus de una comida y hgalo despus de diferentes comidas por rotacin  Los niveles recomendados de azcar en la sangre al levantarse son 90-130 y aproximadamente 2 horas despus de las comidas son 130-160.  Por favor traiga su monitor de azcar en la sangre a cada visita, gracias  Debe tomar suficiente Novolog antes de comer para evitar que el azcar suba a ms de 180 al menos  Caminar diariamente

## 2017-08-24 MED FILL — OLMESARTAN-HCTZ 40-25 MG TA: 40-25 | 30 days supply | Qty: 30 | Fill #0

## 2017-09-03 NOTE — Telephone Encounter (Signed)
Patient is scheduled to come in 09-18-17

## 2017-09-09 MED FILL — BRIMONIDINE 0.2% EYE DROP: 0.2 | 25 days supply | Qty: 5 | Fill #2

## 2017-09-18 ENCOUNTER — Ambulatory Visit: Payer: BLUE CROSS/BLUE SHIELD | Admitting: Medical

## 2017-09-22 ENCOUNTER — Encounter: Payer: Self-pay | Admitting: Medical

## 2017-09-22 ENCOUNTER — Ambulatory Visit: Payer: BLUE CROSS/BLUE SHIELD | Admitting: Medical

## 2017-09-22 VITALS — BP 150/90 | HR 70 | Temp 98.1°F | Resp 16 | Ht 62.0 in | Wt 182.0 lb

## 2017-09-22 DIAGNOSIS — E1165 Type 2 diabetes mellitus with hyperglycemia: Secondary | ICD-10-CM | POA: Diagnosis not present

## 2017-09-22 DIAGNOSIS — I10 Essential (primary) hypertension: Secondary | ICD-10-CM | POA: Diagnosis not present

## 2017-09-22 DIAGNOSIS — R42 Dizziness and giddiness: Secondary | ICD-10-CM | POA: Diagnosis not present

## 2017-09-22 DIAGNOSIS — E785 Hyperlipidemia, unspecified: Secondary | ICD-10-CM | POA: Diagnosis not present

## 2017-09-22 NOTE — Progress Notes (Signed)
Subjective:    Patient ID: Cynthia Mata, female    DOB: 01/02/1946, 72 y.o.   MRN: [id number]  HPI  Pt in for follow up.  Pt states just recently at endocrine office bp was 130/70. Pt bp range at home has been in about the same range of 130/70.  No cardiac or neurologic signs or symptoms.  Pt a1c still high. Pt did not like to take jardiance. It made her vomit. In addition did not do well with invokana. Pt refused appointment with dietician. She expresses that she knows needs to improve her diet.   Pt lipid panel was good about 7 months ago. But hx of high cholesterol before. Pt is on lipitor presently.   Review of Systems  Constitutional: Negative for chills, fatigue and fever.  Respiratory: Negative for cough, chest tightness, shortness of breath and wheezing.   Cardiovascular: Negative for chest pain and palpitations.  Genitourinary: Negative for difficulty urinating, dysuria, flank pain, frequency, hematuria and menstrual problem.  Neurological: Negative for seizures, syncope, weakness and light-headedness.       Transient mild dizzy when walking yesterday. She did not check her blood pressure. Symptoms resolved after rest and no reoccurrence.  Hematological: Negative for adenopathy. Does not bruise/bleed easily.    Past Medical History:  Diagnosis Date  . Anemia   . Breast cyst   . Cataract   . Diabetes mellitus without complication (HCC)   . Hyperlipidemia   . Hypertension   . Macular degeneration   . Osteoporosis   . Thyroid disease   . Trapezius strain 10/23/2014     Social History   Socioeconomic History  . Marital status: Married    Spouse name: Not on file  . Number of children: 5  . Years of education: Not on file  . Highest education level: Not on file  Occupational History  . Not on file  Social Needs  . Financial resource strain: Not on file  . Food insecurity:    Worry: Not on file    Inability: Not on file  . Transportation  needs:    Medical: Not on file    Non-medical: Not on file  Tobacco Use  . Smoking status: Never Smoker  . Smokeless tobacco: Never Used  Substance and Sexual Activity  . Alcohol use: No    Alcohol/week: 0.0 oz  . Drug use: No  . Sexual activity: Not on file  Lifestyle  . Physical activity:    Days per week: Not on file    Minutes per session: Not on file  . Stress: Not on file  Relationships  . Social connections:    Talks on phone: Not on file    Gets together: Not on file    Attends religious service: Not on file    Active member of club or organization: Not on file    Attends meetings of clubs or organizations: Not on file    Relationship status: Not on file  . Intimate partner violence:    Fear of current or ex partner: Not on file    Emotionally abused: Not on file    Physically abused: Not on file    Forced sexual activity: Not on file  Other Topics Concern  . Not on file  Social History Narrative   Lives with daughter.      Past Surgical History:  Procedure Laterality Date  . ANKLE FRACTURE SURGERY     Right ankle  . CATARACT EXTRACTION    .  cataracts  05-2015  . TONSILLECTOMY     Abcess removed also    Family History  Problem Relation Age of Onset  . Hypertension Maternal Aunt   . Heart disease Maternal Aunt   . CAD Maternal Grandfather   . Diabetes Neg Hx   . COPD Neg Hx     No Known Allergies  Current Outpatient Medications on File Prior to Visit  Medication Sig Dispense Refill  . alendronate (FOSAMAX) 10 MG tablet Take 1 tablet (10 mg total) by mouth daily before breakfast. Take with a full glass of water on an empty stomach. 30 tablet 11  . atorvastatin (LIPITOR) 20 MG tablet Take 1 tablet (20 mg total) by mouth daily. 30 tablet 3  . azelastine (ASTELIN) 0.1 % nasal spray Place 2 sprays into both nostrils 2 (two) times daily. Use in each nostril as directed 30 mL 3  . brimonidine (ALPHAGAN) 0.2 % ophthalmic solution   12  .  dorzolamide-timolol (COSOPT) 22.3-6.8 MG/ML ophthalmic solution   12  . glucose blood (ONETOUCH VERIO) test strip Use to check blood sugars 3 times daily. Dx Code E11.9 300 each 3  . insulin aspart (NOVOLOG FLEXPEN) 100 UNIT/ML FlexPen Inject 6 units with breakfast and lunch and 8 units with supper 45 mL 1  . Insulin Glargine (TOUJEO SOLOSTAR) 300 UNIT/ML SOPN Inject 34 Units into the skin daily. (Patient taking differently: Inject 20 Units into the skin daily. ) 18 mL 1  . Insulin Pen Needle (NOVOFINE) 32G X 6 MM MISC USE TO INJECT INSULIN FOUR TIMES DAILY 400 each 3  . meclizine (ANTIVERT) 25 MG tablet Take 1 tablet (25 mg total) by mouth 3 (three) times daily as needed for dizziness. 15 tablet 1  . metFORMIN (GLUCOPHAGE-XR) 500 MG 24 hr tablet Take 3 tablets (1,500 mg total) by mouth daily with supper. (Patient taking differently: Take 1,000 mg by mouth daily with supper. ) 270 tablet 3  . NIFEdipine (NIFEDIAC CC) 60 MG 24 hr tablet Take 1 tablet (60 mg total) by mouth daily. 30 tablet 5  . NIFEdipine (PROCARDIA XL/ADALAT-CC) 60 MG 24 hr tablet TAKE 1 TABLET (60 MG TOTAL) BY MOUTH DAILY. 30 tablet 0  . olmesartan-hydrochlorothiazide (BENICAR HCT) 40-25 MG tablet Take 1 tablet by mouth daily. 30 tablet 3  . ONE TOUCH ULTRA TEST test strip Use to check blood sugar 3 times per day dx code E11.9 100 each 3  . polyethylene glycol powder (MIRALAX) powder Take 17 g by mouth daily. Mix in 4 - 8 oz of liquid. 255 g 0   No current facility-administered medications on file prior to visit.     BP (!) 150/90   Pulse 70   Temp 98.1 F (36.7 C) (Oral)   Resp 16   Ht 5\' 2"  (1.575 m)   Wt 182 lb (82.6 kg)   SpO2 100%   BMI 33.29 kg/m       Objective:   Physical Exam  General Mental Status- Alert. General Appearance- Not in acute distress.   Skin General: Color- Normal Color. Moisture- Normal Moisture.  Neck Carotid Arteries- Normal color. Moisture- Normal Moisture. No carotid bruits. No  JVD.  Chest and Lung Exam Auscultation: Breath Sounds:-Normal.  Cardiovascular Auscultation:Rythm- Regular. Murmurs & Other Heart Sounds:Auscultation of the heart reveals- No Murmurs.  Abdomen Inspection:-Inspeection Normal. Palpation/Percussion:Note:No mass. Palpation and Percussion of the abdomen reveal- Non Tender, Non Distended + BS, no rebound or guarding.    Neurologic Cranial Nerve exam:- CN  III-XII intact(No nystagmus), symmetric smile. Strength:- 5/5 equal and symmetric strength both upper and lower extremities.     Assessment & Plan:  Your blood pressure is high today but recently at endocrinologist office your blood pressure was very well controlled.  In addition you report blood pressure levels well controlled at your house.  I want you to continue to check your blood pressure daily and either call or my chart me readings within 7-10 days.  Want to confirm that blood pressure readings are close to 130/80.  If blood pressures are higher will consider adjusting BP med regimen.  For history of hyperlipidemia, I placed future order to get fasting lipid panel.  Please schedule that on the way out.  For history of diabetes, please eat low sugar diet and follow regimen your endocrinologist has recommended.  You report transient episode of dizziness yesterday but no gross motor or sensory function deficits.  If you have recurrent episodes of dizziness in the future please check your blood pressure, pulse and blood sugar level.  If any abnormalities with those levels/readings please let us know.  If any dizziness with neurologic signs symptoms as discussed then recommend ED evaluation.  Follow-up in 3 months or as needed.  Esperanza Richters, PA-C

## 2017-09-22 NOTE — Patient Instructions (Signed)
Your blood pressure is high today but recently at endocrinologist office your blood pressure was very well controlled.  In addition you report blood pressure levels well controlled at your house.  I want you to continue to check your blood pressure daily and either call or my chart me readings within 7-10 days.  Want to confirm that blood pressure readings are close to 130/80.  If blood pressures are higher will consider adjusting BP med regimen.  For history of hyperlipidemia, I placed future order to get fasting lipid panel.  Please schedule that on the way out.  For history of diabetes, please eat low sugar diet and follow regimen your endocrinologist has recommended.  You report transient episode of dizziness yesterday but no gross motor or sensory function deficits.  If you have recurrent episodes of dizziness in the future please check your blood pressure, pulse and blood sugar level.  If any abnormalities with those levels/readings please let us know.  If any dizziness with neurologic signs symptoms as discussed then recommend ED evaluation.  Follow-up in 3 months or as needed.

## 2017-09-23 ENCOUNTER — Encounter: Payer: Self-pay | Admitting: Medical

## 2017-09-23 MED FILL — TOUJEO SOLOSTAR 300 UNITS/M: 300 | 40 days supply | Qty: 5 | Fill #3

## 2017-09-23 MED FILL — METFORMIN HCL ER 500 MG TAB: 500 | 90 days supply | Qty: 270 | Fill #2

## 2017-09-25 ENCOUNTER — Ambulatory Visit: Payer: BLUE CROSS/BLUE SHIELD | Admitting: Medical

## 2017-10-02 ENCOUNTER — Telehealth: Payer: Self-pay

## 2017-10-02 NOTE — Telephone Encounter (Signed)
Patient called [facility] stating they were here to see Cynthia Mata on April 12th 2019. They waited and hour and a half to see provider but he was so behind they left. She has now received no show letter and fee. Patient would like no fee because she was here on time but the provider was behind. Please advise.

## 2017-10-05 NOTE — Telephone Encounter (Signed)
Spoke with Patient's son, advised the letter was reminding them to reschedule since she was not seen that day and that I would make sure there was no charge for that visit. Son voiced understanding

## 2017-10-06 ENCOUNTER — Other Ambulatory Visit: Payer: BLUE CROSS/BLUE SHIELD

## 2017-10-08 ENCOUNTER — Other Ambulatory Visit (INDEPENDENT_AMBULATORY_CARE_PROVIDER_SITE_OTHER): Payer: BLUE CROSS/BLUE SHIELD

## 2017-10-08 ENCOUNTER — Ambulatory Visit (INDEPENDENT_AMBULATORY_CARE_PROVIDER_SITE_OTHER): Payer: BLUE CROSS/BLUE SHIELD

## 2017-10-08 DIAGNOSIS — E785 Hyperlipidemia, unspecified: Secondary | ICD-10-CM

## 2017-10-08 DIAGNOSIS — Z23 Encounter for immunization: Secondary | ICD-10-CM

## 2017-10-08 LAB — LIPID PANEL
CHOL/HDL RATIO: 4
Cholesterol: 194 mg/dL (ref 0–200)
HDL: 47.6 mg/dL (ref >39.00)
LDL Cholesterol: 125 mg/dL — ABNORMAL HIGH (ref 0–99)
NONHDL: 146.41
Triglycerides: 105 mg/dL (ref 0.0–149.0)
VLDL: 21 mg/dL (ref 0.0–40.0)

## 2017-10-08 NOTE — Progress Notes (Signed)
Patient came in today to receive her Pneumococcal 13 vaccine.  Patient tolerated 0.49mL in her left deltoid.

## 2017-10-16 ENCOUNTER — Other Ambulatory Visit (INDEPENDENT_AMBULATORY_CARE_PROVIDER_SITE_OTHER): Payer: BLUE CROSS/BLUE SHIELD

## 2017-10-16 DIAGNOSIS — Z794 Long term (current) use of insulin: Secondary | ICD-10-CM | POA: Diagnosis not present

## 2017-10-16 DIAGNOSIS — E1165 Type 2 diabetes mellitus with hyperglycemia: Secondary | ICD-10-CM

## 2017-10-16 LAB — COMPREHENSIVE METABOLIC PANEL
ALK PHOS: 57 U/L (ref 39–117)
ALT: 16 U/L (ref 0–35)
AST: 21 U/L (ref 0–37)
Albumin: 3.9 g/dL (ref 3.5–5.2)
BILIRUBIN TOTAL: 0.3 mg/dL (ref 0.2–1.2)
BUN: 17 mg/dL (ref 6–23)
CO2: 32 mEq/L (ref 19–32)
CREATININE: 0.79 mg/dL (ref 0.40–1.20)
Calcium: 9.4 mg/dL (ref 8.4–10.5)
Chloride: 104 mEq/L (ref 96–112)
GFR: 76.34 mL/min (ref >60.00)
GLUCOSE: 103 mg/dL — AB (ref 70–99)
Potassium: 3.9 mEq/L (ref 3.5–5.1)
Sodium: 143 mEq/L (ref 135–145)
TOTAL PROTEIN: 7.1 g/dL (ref 6.0–8.3)

## 2017-10-16 LAB — MICROALBUMIN / CREATININE URINE RATIO
CREATININE, U: 37.8 mg/dL
MICROALB/CREAT RATIO: 4.6 mg/g (ref 0.0–30.0)
Microalb, Ur: 1.7 mg/dL (ref 0.0–1.9)

## 2017-10-16 LAB — HEMOGLOBIN A1C: HEMOGLOBIN A1C: 8.2 % — AB (ref 4.6–6.5)

## 2017-10-19 MED FILL — OLMESARTAN-HCTZ 40-25 MG TA: 40-25 | 30 days supply | Qty: 30 | Fill #1

## 2017-10-19 MED FILL — BRIMONIDINE 0.2% EYE DROP: 0.2 | 25 days supply | Qty: 5 | Fill #3

## 2017-10-21 ENCOUNTER — Ambulatory Visit: Payer: BLUE CROSS/BLUE SHIELD | Admitting: Endocrinology

## 2017-10-21 ENCOUNTER — Other Ambulatory Visit: Payer: Self-pay | Admitting: Medical

## 2017-10-21 ENCOUNTER — Encounter: Payer: Self-pay | Admitting: Endocrinology

## 2017-10-21 VITALS — BP 134/78 | HR 68 | Ht 62.0 in | Wt 182.8 lb

## 2017-10-21 DIAGNOSIS — Z794 Long term (current) use of insulin: Secondary | ICD-10-CM | POA: Diagnosis not present

## 2017-10-21 DIAGNOSIS — E1165 Type 2 diabetes mellitus with hyperglycemia: Secondary | ICD-10-CM | POA: Diagnosis not present

## 2017-10-21 MED FILL — ALENDRONATE NA 10 MG TAB: 10 | 30 days supply | Qty: 30 | Fill #0

## 2017-10-21 NOTE — Progress Notes (Signed)
Patient ID: Cynthia Mata, female   DOB: October 05, 1945, 72 y.o.   MRN: [id number]           Reason for Appointment: Follow-up for Type 2 Diabetes  Referring physician: Saguier  History of Present Illness:          Diagnosis: Type 2 diabetes mellitus, date of diagnosis: 2001        Past history: At the time of diagnosis she had symptoms of increased thirst and urination and very high blood sugars, was obese.   She was [place] with oral medications possibly glipizide and metformin She also changed her diet and was able to lose weight  However she does not think her blood sugars had been consistently controlled In 2012 she was given Levemir insulin but not clear if this helped her sugar control as her A1c was still 9.5 in 2014 She was apparently continued on Metaglip also which he had been taking for a while; she has usually taken only 1 tablet twice a day of the 5/500 tablets.  Has not had any abdominal pain or diarrhea with metformin previously  Recent history:   INSULIN regimen is described as:Toujeo 34 in am. Novolog 0-5  units once or twice daily  Oral hypoglycemic drugs the patient is taking are: Metformin 500 tid  She has not been seen in follow-up since 02/2017  Her A1c has gone up to 9% and is the same as last year  Current management, blood sugar patterns and problems:  She did bring her monitor for download today  She has been recommended taking NovoLog consistently with every meal she still refuses to do so  Her daughter says that she is reluctant to take NovoLog because of fear of hypoglycemia during the night  Apparently had a low sugar episode once when she took her NovoLog late in the evening after eating when the blood sugar was high  Although she is eating carbohydrate at breakfast also she does not take any NovoLog  Recently has not done many FASTING readings but lab glucose was near normal and overnight readings have been near  normal when checked  She has progressively higher readings from morning to evening with average well over 200 after lunch and supper  Despite this she has gained 5 pounds  Previously had refused to consider consultation with dietitian despite reminders  Today also even though she was given the option of trying an insulin pump for more convenience and better control she refuses to consider this           Side effects from medications have been: None  Compliance with the medical regimen: Fair Hypoglycemia: none   Glucose monitoring:  done 2-3 times a day         Glucometer: One Touch.       Blood Glucose readings by   Mean values apply above for all meters except median for One Touch  PRE-MEAL Fasting Lunch Dinner  overnight Overall  Glucose range:  Not checked    88-116  88-387  Mean/median:     224   POST-MEAL PC Breakfast PC Lunch PC Dinner  Glucose range:    149-387  Mean/median:  229  238  228    Self-care: The diet that the patient has been following is: tries to limit Sweets, carbs and fats and more vegetables .      Meals: 3 meals per day. Breakfast is eggs, beans and/or bread   .  Has chicken and vegetables for lunch and dinner with some tortillas.  Has bread and nuts for snacks    Dinner is at 6-7 pm          Exercise:  walking 2-3 times a week       Dietician visit, most recent: 2016.               Weight history:  Wt Readings from Last 3 Encounters:  10/21/17 182 lb 12.8 oz (82.9 kg)  09/22/17 182 lb (82.6 kg)  08/21/17 177 lb (80.3 kg)    Glycemic control:   Lab Results  Component Value Date   HGBA1C 8.2 (H) 10/16/2017   HGBA1C 9.0 (H) 07/14/2017   HGBA1C 9.0 (H) 02/11/2017   Lab Results  Component Value Date   MICROALBUR 1.7 10/16/2017   LDLCALC 125 (H) 10/08/2017   CREATININE 0.79 10/16/2017       Allergies as of 10/21/2017   No Known Allergies     Medication List        Accurate as of 10/21/17 12:36 PM. Always use your most recent  med list.          alendronate 10 MG tablet Commonly known as:  FOSAMAX Take 1 tablet (10 mg total) by mouth daily before breakfast. Take with a full glass of water on an empty stomach.   atorvastatin 20 MG tablet Commonly known as:  LIPITOR Take 1 tablet (20 mg total) by mouth daily.   brimonidine 0.2 % ophthalmic solution Commonly known as:  ALPHAGAN   dorzolamide-timolol 22.3-6.8 MG/ML ophthalmic solution Commonly known as:  COSOPT   insulin aspart 100 UNIT/ML FlexPen Commonly known as:  NOVOLOG FLEXPEN Inject 6 units with breakfast and lunch and 8 units with supper   Insulin Glargine 300 UNIT/ML Sopn Commonly known as:  TOUJEO SOLOSTAR Inject 34 Units into the skin daily.   Insulin Pen Needle 32G X 6 MM Misc Commonly known as:  NOVOFINE USE TO INJECT INSULIN FOUR TIMES DAILY   metFORMIN 500 MG 24 hr tablet Commonly known as:  GLUCOPHAGE-XR Take 3 tablets (1,500 mg total) by mouth daily with supper.   NIFEdipine 60 MG 24 hr tablet Commonly known as:  NIFEDIAC CC Take 1 tablet (60 mg total) by mouth daily.   olmesartan-hydrochlorothiazide 40-25 MG tablet Commonly known as:  BENICAR HCT Take 1 tablet by mouth daily.   ONE TOUCH ULTRA TEST test strip Generic drug:  glucose blood Use to check blood sugar 3 times per day dx code E11.9   glucose blood test strip Commonly known as:  ONETOUCH VERIO Use to check blood sugars 3 times daily. Dx Code E11.9       Allergies: No Known Allergies  Past Medical History:  Diagnosis Date  . Anemia   . Breast cyst   . Cataract   . Diabetes mellitus without complication (HCC)   . Hyperlipidemia   . Hypertension   . Macular degeneration   . Osteoporosis   . Thyroid disease   . Trapezius strain 10/23/2014    Past Surgical History:  Procedure Laterality Date  . ANKLE FRACTURE SURGERY     Right ankle  . CATARACT EXTRACTION    . cataracts  05-2015  . TONSILLECTOMY     Abcess removed also    Family History    Problem Relation Age of Onset  . Hypertension Maternal Aunt   . Heart disease Maternal Aunt   . CAD Maternal Grandfather   . Diabetes Neg  Hx   . COPD Neg Hx     Social History:  reports that she has never smoked. She has never used smokeless tobacco. She reports that she does not drink alcohol or use drugs.    Review of Systems    Most recent eye exam was In 2/18, She does have glaucoma       Lipids: On  Rx with Lipitor          Lab Results  Component Value Date   CHOL 194 10/08/2017   HDL 47.60 10/08/2017   LDLCALC 125 (H) 10/08/2017   TRIG 105.0 10/08/2017   CHOLHDL 4 10/08/2017                    The blood pressure has been high for 15 years treated with Benicar HCT half a tablet and Procardia  by PCP     LABS:  Lab on 10/16/2017  Component Date Value Ref Range Status  . Microalb, Ur 10/16/2017 1.7  0.0 - 1.9 mg/dL Final  . Creatinine,U 40/98/1191 37.8  mg/dL Final  . Microalb Creat Ratio 10/16/2017 4.6  0.0 - 30.0 mg/g Final  . Sodium 10/16/2017 143  135 - 145 mEq/L Final  . Potassium 10/16/2017 3.9  3.5 - 5.1 mEq/L Final  . Chloride 10/16/2017 104  96 - 112 mEq/L Final  . CO2 10/16/2017 32  19 - 32 mEq/L Final  . Glucose, Bld 10/16/2017 103* 70 - 99 mg/dL Final  . BUN 47/82/9562 17  6 - 23 mg/dL Final  . Creatinine, Ser 10/16/2017 0.79  0.40 - 1.20 mg/dL Final  . Total Bilirubin 10/16/2017 0.3  0.2 - 1.2 mg/dL Final  . Alkaline Phosphatase 10/16/2017 57  39 - 117 U/L Final  . AST 10/16/2017 21  0 - 37 U/L Final  . ALT 10/16/2017 16  0 - 35 U/L Final  . Total Protein 10/16/2017 7.1  6.0 - 8.3 g/dL Final  . Albumin 13/01/6577 3.9  3.5 - 5.2 g/dL Final  . Calcium 46/96/2952 9.4  8.4 - 10.5 mg/dL Final  . GFR 84/13/2440 76.34  >60.00 mL/min Final  . Hgb A1c MFr Bld 10/16/2017 8.2* 4.6 - 6.5 % Final   Glycemic Control Guidelines for People with Diabetes:Non Diabetic:  <6%Goal of Therapy: <7%Additional Action Suggested:  >8%     Physical  Examination:  BP 134/78 (BP Location: Left Arm, Patient Position: Sitting, Cuff Size: Normal)   Pulse 68   Ht  (1.575 m)   Wt 182 lb 12.8 oz (82.9 kg)   SpO2 98%   BMI 33.43 kg/m      ASSESSMENT/PLAN:  Diabetes type 2, uncontrolled with obesity and insulin dependence  See history of present illness for current blood sugar patterns, problems identified and current management  Blood sugars are only slightly better with A1c 8.2 compared to 9%  As discussed above she has marked increase in postprandial readings averages well over 200 She is only taking sporadic doses of NovoLog and continues to be refusing to take this as directed for fear of hypoglycemia Overnight readings appear to be mostly near normal as judged by her lab glucose of 103 and occasional overnight readings at home Previously had refused to take Jardiance because of nonspecific side effects  Again discussed the importance of controlling postprandial readings as well as importance of good diabetes control to prevent complications Explained to her that if she takes her NovoLog on time before eating she will not have nocturnal  hypoglycemia Also showed her the V-go pump and discussed in detail how this would be easy to use in the meantime as well as provide her with better control with only a single type of insulin She refuses to consider this  Recommendations:  She will start taking NovoLog before each meal, 4 units at breakfast and 8 units at lunch and supper  She thinks that she can try and do better with her compliance to help her control  Her daughter will help her compliance also.  She will reduce Toujeo by 4 units to reduce potential of overnight hypoglycemia  Discussed blood sugar targets both fasting and after meals  She does need to check more readings waking up also  If she has variable readings will need to consider consultation with dietitian also  Hypercholesterolemia: This is followed by her  PCP, currently LDL not at target   HYPERTENSION: Blood pressure is controlled  Counseling time on subjects discussed in assessment and plan sections is over 50% of today's 25 minute visit    Patient Instructions  Must take 4 Novolog at [facility] and 8 at lunch and dinner  Take 30 Toujeo when doing Novolog 3x daily  Check blood sugars on waking up 2-3/7   Also check blood sugars about 2 hours after a meal and do this after different meals by rotation  Recommended blood sugar levels on waking up is 90-130 and about 2 hours after meal is 130-180  Please bring your blood sugar monitor to each visit, thank you        Reather Littler 10/21/2017, 12:36 PM   Note: This office note was prepared with Dragon voice recognition system technology. Any transcriptional errors that result from this process are unintentional.

## 2017-10-21 NOTE — Patient Instructions (Signed)
Must take 4 Novolog at [facility] and 8 at lunch and dinner  Take 30 Toujeo when doing Novolog 3x daily  Check blood sugars on waking up 2-3/7   Also check blood sugars about 2 hours after a meal and do this after different meals by rotation  Recommended blood sugar levels on waking up is 90-130 and about 2 hours after meal is 130-180  Please bring your blood sugar monitor to each visit, thank you

## 2017-10-22 MED FILL — DORZOLAMIDE-TIMOLOL EYE DRP: 22.3-6.8 | 50 days supply | Qty: 10 | Fill #2

## 2017-11-06 MED FILL — TOUJEO SOLOSTAR 300 UNITS/M: 300 | 40 days supply | Qty: 5 | Fill #4

## 2017-11-11 ENCOUNTER — Encounter: Payer: BLUE CROSS/BLUE SHIELD | Admitting: Nutrition

## 2017-11-20 MED FILL — BRIMONIDINE 0.2% EYE DROP: 0.2 | 25 days supply | Qty: 5 | Fill #4

## 2017-11-30 ENCOUNTER — Encounter: Payer: BLUE CROSS/BLUE SHIELD | Attending: Endocrinology | Admitting: Nutrition

## 2017-11-30 DIAGNOSIS — Z029 Encounter for administrative examinations, unspecified: Secondary | ICD-10-CM | POA: Insufficient documentation

## 2017-11-30 DIAGNOSIS — E119 Type 2 diabetes mellitus without complications: Secondary | ICD-10-CM

## 2017-11-30 DIAGNOSIS — Z794 Long term (current) use of insulin: Secondary | ICD-10-CM

## 2017-11-30 MED FILL — DORZOLAMIDE-TIMOLOL EYE DRP: 22.3-6.8 | 50 days supply | Qty: 10 | Fill #0

## 2017-12-01 NOTE — Patient Instructions (Signed)
Take 2u of Novolog before breakfast Test blood sugars before meals and at bedtime.

## 2017-12-01 NOTE — Progress Notes (Signed)
Patient is here with her son to review diet and insulin doses.  She did not bring her meter.  Says FBSs are now in the low 100s, today it was 104, yesterday 120. AcL and acS are in the low 200s.   Insulin :  Toujeo: 20u,  Novolog 4u acL and acS only.  Says she is active in the mornings and does not want to take the Novolog for fear of dropping low. Diet is balanced meals, and low in carbs:  15-30g at each meal, with protein and will have a fruit between meals.  Rare HS snacking. Walking 4X/wk for 30 min. acS Commended her on her diet and stressed the need to take 2u of Novolog acB to prevent the blood sugar from going high acL.  She agreed to do this, and I will call her on Wednesday to see what her readings are doing with taking the insulin acB.

## 2017-12-03 MED FILL — OLMESARTAN-HCTZ 40-25 MG TA: 40-25 | 30 days supply | Qty: 30 | Fill #2

## 2017-12-11 MED FILL — BRIMONIDINE 0.2% EYE DROP: 0.2 | 25 days supply | Qty: 5 | Fill #5

## 2017-12-17 MED FILL — TECHLITE PEN NDL 32GX1/4: 32G X 6 MM | 75 days supply | Qty: 300 | Fill #1

## 2017-12-17 MED FILL — TOUJEO SOLOSTAR 300 UNITS/M: 300 | 40 days supply | Qty: 5 | Fill #5

## 2017-12-17 MED FILL — TECHLITE PEN NDL 32GX1/4": 32G X 6 MM | 75 days supply | Qty: 300 | Fill #1

## 2017-12-22 ENCOUNTER — Ambulatory Visit (HOSPITAL_BASED_OUTPATIENT_CLINIC_OR_DEPARTMENT_OTHER)
Admission: RE | Admit: 2017-12-22 | Discharge: 2017-12-22 | Disposition: A | Discharge status: Home / Self Care | Payer: BLUE CROSS/BLUE SHIELD | Source: Ambulatory Visit | Attending: Medical | Admitting: Medical

## 2017-12-22 ENCOUNTER — Ambulatory Visit: Payer: BLUE CROSS/BLUE SHIELD | Admitting: Medical

## 2017-12-22 ENCOUNTER — Encounter: Payer: Self-pay | Admitting: Medical

## 2017-12-22 VITALS — BP 160/80 | HR 67 | Temp 98.3°F | Resp 16 | Ht 62.0 in | Wt 181.6 lb

## 2017-12-22 DIAGNOSIS — R002 Palpitations: Secondary | ICD-10-CM

## 2017-12-22 DIAGNOSIS — E1165 Type 2 diabetes mellitus with hyperglycemia: Secondary | ICD-10-CM

## 2017-12-22 DIAGNOSIS — R42 Dizziness and giddiness: Secondary | ICD-10-CM

## 2017-12-22 DIAGNOSIS — I10 Essential (primary) hypertension: Secondary | ICD-10-CM | POA: Diagnosis not present

## 2017-12-22 DIAGNOSIS — K5900 Constipation, unspecified: Secondary | ICD-10-CM | POA: Insufficient documentation

## 2017-12-22 NOTE — Patient Instructions (Addendum)
Your blood pressure is elevated today but you did not take Benicar today.  You speculated that your blood pressure being in the 120-130 systolic range was causing transient episodes of dizziness.  These readings are actually not that low.  And in most persons this systolic reading would not cause dizziness.  Also you report that your machine is old and you question that it is giving accurate readings.  So for the time being I want you to check your blood pressures twice a day with a new machine.  And note to her pulse readings as well.  During these events recommend that you check your blood pressure and include blood sugar check.   Your EKG today showed left bundle branch block.  Compared to 2016 EKG and it looks virtually the exact same.  You had normal neurologic exam today.  No current dizziness.  As explained we will follow you closely.  Since not currently dizzy decided not to do labs.  If dizziness becomes more constant and not associated with blood pressure, pulse or blood sugar then might consider imaging studies of head.  For constipation, you can try a Dulcolax or MiraLAX today.  Since you have not had bowel movement for almost 4 Days Did Place x-ray order of abdomen to be done today.  You do report occasional palpitation sensation when you get dizzy.  We need to follow this up closely.  As explained when you feel dizzy check your blood pressure, pulse and sugar level.  If your palpitations are becoming more frequent then I would refer you to cardiologist for consideration of Holter monitor.  Follow-up in 3 to 4 weeks or as needed.

## 2017-12-22 NOTE — Progress Notes (Signed)
Subjective:    Patient ID: Cynthia Mata, female    DOB: 1945-10-21, 72 y.o.   MRN: [id number]  HPI  Pt in for follow up.  This morning. Pt did take nifedipine but she forgot to take benicar this morning. Pt bp is 120-130/70.  Pt at times feels dizzy for about one hour(but rare and not daily). Pt contributes dizziness to her bp. No associated gross motor or sensory functions deficits.   Pt is not checking her blood sugar when feels dizzy. Recently he sugars 120-170.  Recently better sugar levels. She is avoiding high sugar foods that shoot sugar up.   Pt also reports occasionally will feel possible palpitations when feels dizzy.Unclear if true palpitation as she states she gets nervous when feels dizzy and sounds like she might have fast heart rate when she feels stressed.  Pt also stressed about upcoming trip.  Review of Systems  Constitutional: Negative for chills, fatigue and fever.  HENT: Negative for congestion, ear pain and hearing loss.   Respiratory: Negative for cough, chest tightness, shortness of breath and wheezing.   Cardiovascular: Negative for chest pain and palpitations.  Gastrointestinal: Positive for constipation. Negative for abdominal distention, abdominal pain, diarrhea, rectal pain and vomiting.       Sometimes will be constipated. Some times 3-4 days pass. Today last time had bm was Friday.   Genitourinary: Negative for difficulty urinating, dysuria and flank pain.  Musculoskeletal: Negative for back pain, neck pain and neck stiffness.  Skin: Negative for rash.  Neurological: Negative for dizziness, seizures, speech difficulty, weakness and light-headedness.  Hematological: Negative for adenopathy. Does not bruise/bleed easily.  Psychiatric/Behavioral: Negative for agitation, confusion and hallucinations. The patient is not nervous/anxious.     Past Medical History:  Diagnosis Date  . Anemia   . Breast cyst   . Cataract   . Diabetes mellitus  without complication (HCC)   . Hyperlipidemia   . Hypertension   . Macular degeneration   . Osteoporosis   . Thyroid disease   . Trapezius strain 10/23/2014     Social History   Socioeconomic History  . Marital status: Married    Spouse name: Not on file  . Number of children: 5  . Years of education: Not on file  . Highest education level: Not on file  Occupational History  . Not on file  Social Needs  . Financial resource strain: Not on file  . Food insecurity:    Worry: Not on file    Inability: Not on file  . Transportation needs:    Medical: Not on file    Non-medical: Not on file  Tobacco Use  . Smoking status: Never Smoker  . Smokeless tobacco: Never Used  Substance and Sexual Activity  . Alcohol use: No    Alcohol/week: 0.0 oz  . Drug use: No  . Sexual activity: Not on file  Lifestyle  . Physical activity:    Days per week: Not on file    Minutes per session: Not on file  . Stress: Not on file  Relationships  . Social connections:    Talks on phone: Not on file    Gets together: Not on file    Attends religious service: Not on file    Active member of club or organization: Not on file    Attends meetings of clubs or organizations: Not on file    Relationship status: Not on file  . Intimate partner violence:  Fear of current or ex partner: Not on file    Emotionally abused: Not on file    Physically abused: Not on file    Forced sexual activity: Not on file  Other Topics Concern  . Not on file  Social History Narrative   Lives with daughter.      Past Surgical History:  Procedure Laterality Date  . ANKLE FRACTURE SURGERY     Right ankle  . CATARACT EXTRACTION    . cataracts  05-2015  . TONSILLECTOMY     Abcess removed also    Family History  Problem Relation Age of Onset  . Hypertension Maternal Aunt   . Heart disease Maternal Aunt   . CAD Maternal Grandfather   . Diabetes Neg Hx   . COPD Neg Hx     No Known Allergies  Current  Outpatient Medications on File Prior to Visit  Medication Sig Dispense Refill  . alendronate (FOSAMAX) 10 MG tablet TAKE 1 TABLET BY MOUTH DAILY BEFORE BREAKFAST. TAKE WITH A FULL GLASS OF WATER ON AN EMPTY STOMACH. 30 tablet 11  . atorvastatin (LIPITOR) 20 MG tablet Take 1 tablet (20 mg total) by mouth daily. 30 tablet 3  . brimonidine (ALPHAGAN) 0.2 % ophthalmic solution   12  . dorzolamide-timolol (COSOPT) 22.3-6.8 MG/ML ophthalmic solution   12  . glucose blood (ONETOUCH VERIO) test strip Use to check blood sugars 3 times daily. Dx Code E11.9 300 each 3  . insulin aspart (NOVOLOG FLEXPEN) 100 UNIT/ML FlexPen Inject 6 units with breakfast and lunch and 8 units with supper 45 mL 1  . Insulin Glargine (TOUJEO SOLOSTAR) 300 UNIT/ML SOPN Inject 34 Units into the skin daily. (Patient taking differently: Inject 20 Units into the skin daily. ) 18 mL 1  . Insulin Pen Needle (NOVOFINE) 32G X 6 MM MISC USE TO INJECT INSULIN FOUR TIMES DAILY 400 each 3  . metFORMIN (GLUCOPHAGE-XR) 500 MG 24 hr tablet Take 3 tablets (1,500 mg total) by mouth daily with supper. (Patient taking differently: Take 1,000 mg by mouth daily with supper. ) 270 tablet 3  . NIFEdipine (NIFEDIAC CC) 60 MG 24 hr tablet Take 1 tablet (60 mg total) by mouth daily. 30 tablet 5  . olmesartan-hydrochlorothiazide (BENICAR HCT) 40-25 MG tablet Take 1 tablet by mouth daily. 30 tablet 3  . ONE TOUCH ULTRA TEST test strip Use to check blood sugar 3 times per day dx code E11.9 100 each 3   No current facility-administered medications on file prior to visit.     BP (!) 166/62   Pulse 67   Temp 98.3 F (36.8 C) (Oral)   Resp 16   Ht 5\' 2"  (1.575 m)   Wt 181 lb 9.6 oz (82.4 kg)   SpO2 100%   BMI 33.22 kg/m       Objective:   Physical Exam  General Mental Status- Alert. General Appearance- Not in acute distress.   Skin General: Color- Normal Color. Moisture- Normal Moisture.  Neck Carotid Arteries- Normal color. Moisture-  Normal Moisture. No carotid bruits. No JVD.  Chest and Lung Exam Auscultation: Breath Sounds:-Normal.  Cardiovascular Auscultation:Rythm- Regular. Murmurs & Other Heart Sounds:Auscultation of the heart reveals- No Murmurs.  Abdomen Inspection:-Inspeection Normal. Palpation/Percussion:Note:No mass. Palpation and Percussion of the abdomen reveal- Non Tender, Non Distended + BS, no rebound or guarding.    Neurologic Cranial Nerve exam:- CN III-XII intact(No nystagmus), symmetric smile. Drift Test:- No drift. Romberg Exam:- Negative.  Heal to Toe Gait  exam:-Normal. Finger to Nose:- Normal/Intact  Strength:- 5/5 equal and symmetric strength both upper and lower extremities.     Assessment & Plan:  Your blood pressure is elevated today but you did not take Benicar today.  You speculated that your blood pressure being in the 120-130 systolic range was causing transient episodes of dizziness.  These readings are actually not that low.  And in most persons this systolic reading would not cause dizziness.  Also you report that your machine is old and you question that it is giving accurate readings.  So for the time being I want you to check your blood pressures twice a day with a new machine.  And note to her pulse readings as well.  During these events recommend that you check your blood pressure and include blood sugar check.   Your EKG today showed left bundle branch block.  Compared to 2016 EKG and it looks virtually the exact same.  You had normal neurologic exam today.  No current dizziness.  As explained we will follow you closely.  Since not currently dizzy decided not to do labs.  If dizziness becomes more constant and not associated with blood pressure, pulse or blood sugar then might consider imaging studies of head.  For constipation, you can try a Dulcolax or MiraLAX today.  Since you have not had bowel movement for almost 4 days Did Place x-ray order of abdomen to be done  today.  You do report occasional palpitation sensation when you get dizzy.  We need to follow this up closely.  As explained when you feel dizzy check your blood pressure, pulse and sugar level.  If your palpitations are becoming more frequent then I would refer you to cardiologist for consideration of Holter monitor.  Follow-up in 3 to 4 weeks or as needed.  40 minutes spent with patient today.  50% of time spent counseling on her various diagnoses and concerns today.

## 2018-01-14 MED FILL — OLMESARTAN-HCTZ 40-25 MG TA: 40-25 | 30 days supply | Qty: 30 | Fill #3

## 2018-01-14 MED FILL — BRIMONIDINE 0.2% EYE DROP: 0.2 | 25 days supply | Qty: 5 | Fill #6

## 2018-01-14 MED FILL — DORZOLAMIDE-TIMOLOL EYE DRP: 22.3-6.8 | 50 days supply | Qty: 10 | Fill #3

## 2018-01-18 ENCOUNTER — Other Ambulatory Visit: Payer: BLUE CROSS/BLUE SHIELD

## 2018-01-21 ENCOUNTER — Ambulatory Visit: Payer: BLUE CROSS/BLUE SHIELD | Admitting: Endocrinology

## 2018-01-25 MED FILL — ALENDRONATE NA 10 MG TAB: 10 | 30 days supply | Qty: 30 | Fill #1

## 2018-01-25 MED FILL — TOUJEO SOLOSTAR 300 UNITS/M: 300 | 40 days supply | Qty: 5 | Fill #6

## 2018-01-25 MED FILL — METFORMIN HCL ER 500 MG TAB: 500 | 90 days supply | Qty: 270 | Fill #3

## 2018-02-15 MED FILL — BRIMONIDINE TARTRATE 0.2 %: 0.2 | 25 days supply | Qty: 5 | Fill #7

## 2018-03-05 ENCOUNTER — Other Ambulatory Visit: Payer: BLUE CROSS/BLUE SHIELD

## 2018-03-08 ENCOUNTER — Other Ambulatory Visit: Payer: Self-pay | Admitting: Medical

## 2018-03-08 ENCOUNTER — Other Ambulatory Visit: Payer: Self-pay | Admitting: Endocrinology

## 2018-03-08 MED FILL — OLMESARTAN-HCTZ 40-25 MG TA: 40-25 | 30 days supply | Qty: 30 | Fill #0

## 2018-03-08 MED FILL — TOUJEO SOLOSTAR 300 UNITS/M: 300 | 79 days supply | Qty: 9 | Fill #0

## 2018-03-08 NOTE — Telephone Encounter (Signed)
Copied from CRM [id number]. Topic: Quick Communication - Rx Refill/Question >> Mar 08, 2018  3:29 PM Alexander Bergeron B wrote: Medication: olmesartan-hydrochlorothiazide (BENICAR HCT) 40-25 MG tablet [[id number]]   Has the patient contacted their pharmacy? Yes.   (Agent: If no, request that the patient contact the pharmacy for the refill.) (Agent: If yes, when and what did the pharmacy advise?)  Preferred Pharmacy (with phone number or street name): [facility]  Agent: Please be advised that RX refills may take up to 3 business days. We ask that you follow-up with your pharmacy.

## 2018-03-09 ENCOUNTER — Ambulatory Visit: Payer: BLUE CROSS/BLUE SHIELD | Admitting: Endocrinology

## 2018-03-12 ENCOUNTER — Other Ambulatory Visit (INDEPENDENT_AMBULATORY_CARE_PROVIDER_SITE_OTHER): Payer: BLUE CROSS/BLUE SHIELD

## 2018-03-12 DIAGNOSIS — E1165 Type 2 diabetes mellitus with hyperglycemia: Secondary | ICD-10-CM | POA: Diagnosis not present

## 2018-03-12 DIAGNOSIS — Z794 Long term (current) use of insulin: Secondary | ICD-10-CM | POA: Diagnosis not present

## 2018-03-12 LAB — BASIC METABOLIC PANEL
BUN: 19 mg/dL (ref 6–23)
CHLORIDE: 104 meq/L (ref 96–112)
CO2: 30 meq/L (ref 19–32)
Calcium: 9.3 mg/dL (ref 8.4–10.5)
Creatinine, Ser: 0.83 mg/dL (ref 0.40–1.20)
GFR: 72.03 mL/min (ref >60.00)
Glucose, Bld: 133 mg/dL — ABNORMAL HIGH (ref 70–99)
POTASSIUM: 3.8 meq/L (ref 3.5–5.1)
SODIUM: 142 meq/L (ref 135–145)

## 2018-03-12 LAB — HEMOGLOBIN A1C: HEMOGLOBIN A1C: 8.6 % — AB (ref 4.6–6.5)

## 2018-03-14 NOTE — Progress Notes (Signed)
This encounter was created in error - please disregard.

## 2018-03-15 ENCOUNTER — Encounter: Payer: BLUE CROSS/BLUE SHIELD | Admitting: Endocrinology

## 2018-03-15 ENCOUNTER — Encounter: Payer: Self-pay | Admitting: Endocrinology

## 2018-03-15 MED ORDER — GLUCOSE BLOOD VI STRP: ORAL_STRIP | 12 refills | Status: AC

## 2018-03-15 MED ORDER — INSULIN ASPART 100 UNIT/ML FLEXPEN: PEN_INJECTOR | SUBCUTANEOUS | 1 refills | Status: AC

## 2018-03-15 MED FILL — NOVOLOG FLEXPEN SYRINGE: 100 | 90 days supply | Qty: 18 | Fill #0

## 2018-03-16 MED FILL — NIFEdipine ER 60 MG TB24: 60 | 30 days supply | Qty: 30 | Fill #0

## 2018-03-17 ENCOUNTER — Ambulatory Visit: Payer: BLUE CROSS/BLUE SHIELD | Admitting: Endocrinology

## 2018-03-19 MED FILL — BRIMONIDINE TARTRATE 0.2 %: 0.2 | 25 days supply | Qty: 5 | Fill #8

## 2018-04-06 ENCOUNTER — Ambulatory Visit: Payer: BLUE CROSS/BLUE SHIELD | Admitting: Internal Medicine

## 2018-04-07 NOTE — Progress Notes (Signed)
Name: Cynthia Mata de [facility]  Age/ Sex: 72 y.o., female   MRN/ DOB: [id number], 1945/12/10     PCP: Marisue Brooklyn   Reason for Endocrinology Evaluation: Type 2 Diabetes Mellitus  Initial Endocrine Consultative Visit: 08/07/2014    PATIENT IDENTIFIER: Ms. Cynthia Mata is a 72 y.o. female with a past medical history of HTN, DM, macular degeneration, dyslipidemia, hypothyroidism and osteoporosis. The patient has followed with Endocrinology clinic since 2016 for consultative assistance with management of her diabetes.  DIABETIC HISTORY:  Ms. Cynthia Mata was diagnosed with DM in 2001, and started insulin therapy approximately 11 years after diagnosis. She was started initially on oral glycemic agents in her [place] country (? Metformin, glipizide). In 2016 she was started on Jardiance but she stopped it due to complaints of dizziness.  Her hemoglobin A1c has ranged from 7.7% in 2016, peaking at 9.5%  In 2014.   SUBJECTIVE:   During the last visit (10/21/17): Her A1c was 8.2%. She had not been taking Novolog due to fear of hypoglycemia. Toujeo was reduced to 30 units and Novolog changed to 4 units with breakfast, 8 units with lunch and supper .  Today (04/07/2018): Ms. Cynthia Mata is accompanied by her son Cynthia Mata.  She checks her blood sugars 3 times daily. The patient has not had hypoglycemic episodes since the last clinic visit. She does take toujeo consistently but only takes 4units of Novolog with higher then usual starchy meals. Otherwise, the patient has not required any recent emergency interventions for hypoglycemia and has not had recent hospitalizations secondary to hyper or hypoglycemic episodes.   She also has been taking Metformin with luch and dinner but not with supper, stating she eats a small breakfast that consists of eggs and beans.   In terms of diet, she is on a  poor diet. She likes to snack and eat cookies at times. She understands they will cause hyperglycemia but sometimes she can't help herself.    ROS: As per HPI and as detailed below: Review of Systems  Constitutional: Negative for fever and weight loss.  HENT: Negative for congestion and sore throat.   Eyes: Positive for blurred vision and redness.  Respiratory: Negative for cough and shortness of breath.   Cardiovascular: Positive for palpitations and leg swelling.       Palpitations at night   Gastrointestinal: Positive for constipation. Negative for nausea.  Skin: Negative.   Neurological: Positive for tingling. Negative for tremors.      HOME DIABETES REGIMEN:  Metformin 500 mg with lunch and supper - doesn't take it with breakfast  Toujeo 34 units daily Novolog 4 units with breakfast, 8 units with lunch and supper (patient takes only 4 units with higher then usual starchy meal)  Statin: Yes ACE-I/ARB: Yes Prior Diabetic Education: Yes   GLUCOSE LOG:     DIABETIC COMPLICATIONS: Microvascular complications:   Retinopathy S/P treatment B/L , neuropathy  Denies: CKD  Last Eye Exam: Completed 02/2018  Macrovascular complications:   ?CAD  Denies: CAD, CVA, PVD   HISTORY:  Past Medical History:  Past Medical History:  Diagnosis Date  . Anemia   . Breast cyst   . Cataract   . Diabetes mellitus without complication (HCC)   . Hyperlipidemia   . Hypertension   . Macular degeneration   . Osteoporosis   . Thyroid disease   . Trapezius strain 10/23/2014    Past Surgical History:  Past Surgical History:  Procedure  Laterality Date  . ANKLE FRACTURE SURGERY     Right ankle  . CATARACT EXTRACTION    . cataracts  05-2015  . TONSILLECTOMY     Abcess removed also     Social History:  reports that she has never smoked. She has never used smokeless tobacco. She reports that she does not drink alcohol or use drugs. Family History:  Family History  Problem  Relation Age of Onset  . Hypertension Maternal Aunt   . Heart disease Maternal Aunt   . CAD Maternal Grandfather   . Diabetes Neg Hx   . COPD Neg Hx       HOME MEDICATIONS: Allergies as of 04/08/2018   No Known Allergies     Medication List        Accurate as of 04/07/18  3:44 PM. Always use your most recent med list.          alendronate 10 MG tablet Commonly known as:  FOSAMAX TAKE 1 TABLET BY MOUTH DAILY BEFORE BREAKFAST. TAKE WITH A FULL GLASS OF WATER ON AN EMPTY STOMACH.   atorvastatin 20 MG tablet Commonly known as:  LIPITOR Take 1 tablet (20 mg total) by mouth daily.   brimonidine 0.2 % ophthalmic solution Commonly known as:  ALPHAGAN   dorzolamide-timolol 22.3-6.8 MG/ML ophthalmic solution Commonly known as:  COSOPT   insulin aspart 100 UNIT/ML FlexPen Commonly known as:  NOVOLOG Inject 6 units with breakfast and lunch and 8 units with supper   Insulin Pen Needle 32G X 6 MM Misc USE TO INJECT INSULIN FOUR TIMES DAILY   metFORMIN 500 MG 24 hr tablet Commonly known as:  GLUCOPHAGE-XR Take 3 tablets (1,500 mg total) by mouth daily with supper.   NIFEdipine 60 MG 24 hr tablet Commonly known as:  ADALAT CC Take 1 tablet (60 mg total) by mouth daily.   olmesartan-hydrochlorothiazide 40-25 MG tablet Commonly known as:  BENICAR HCT TAKE 1 TABLET BY MOUTH DAILY.   ONE TOUCH ULTRA TEST test strip Generic drug:  glucose blood Use to check blood sugar 3 times per day dx code E11.9   glucose blood test strip Use to check blood sugars 3 times daily. Dx Code E11.9   glucose blood test strip Use as instructed   TOUJEO SOLOSTAR 300 UNIT/ML Sopn Generic drug:  Insulin Glargine INJECT 34 UNITS INTO THE SKIN DAILY.        OBJECTIVE:   Vital Signs: There were no vitals taken for this visit.  Wt Readings from Last 3 Encounters:  03/15/18 186 lb 9.6 oz (84.6 kg)  12/22/17 181 lb 9.6 oz (82.4 kg)  10/21/17 182 lb 12.8 oz (82.9 kg)      Exam: General: Pt appears well and is in NAD  Hydration: Well-hydrated with moist mucous membranes and good skin turgor  HEENT: Head: Unremarkable with good dentition. Oropharynx clear without exudate.  Eyes: External eye exam normal without stare, lid lag or exophthalmos.  EOM intact.  PERRL.  Neck: General: Supple without adenopathy. Thyroid: Thyroid size normal.  No goiter or nodules appreciated. No thyroid bruit.  Lungs: Clear with good BS bilat with no rales, rhonchi, or wheezes  Heart: RRR with normal S1 and S2 and no gallops; + murmurs; no rub  Abdomen: Normoactive bowel sounds, soft, nontender, without masses or organomegaly palpable  Extremities: 1+ pretibial edema. No tremor. Normal strength and motion throughout. See detailed diabetic foot exam below.  Skin: Normal texture and temperature to palpation. No rash noted. No  Acanthosis nigricans/skin tags.   Neuro: MS is good with appropriate affect, pt is alert and Ox3       DATA REVIEWED:  Lab Results  Component Value Date   HGBA1C 8.6 (H) 03/12/2018   HGBA1C 8.2 (H) 10/16/2017   HGBA1C 9.0 (H) 07/14/2017    Lab Results  Component Value Date   MICRALBCREAT 4.6 10/16/2017         Old records , labs and images have been reviewed.    ASSESSMENT / PLAN / RECOMMENDATIONS:   1) Type 2 Diabetes Mellitus, poorly controlled, With microvascular complications - Most recent A1c of 8.6%. Goal A1c < 7.0 %.    PLAN Poorly controlled diabetes secondary to medication nonadherence and dietary indiscretion. Patient has fear of hypoglycemia, thus she does not take NovoLog with meals on a regular basis. We discussed the pharmacokinetics of basal/bolus insulin and the importance of taking prandial insulin with meals. We reviewed the rule 15 for hypoglycemia. We discussed avoiding snacks in between the meals. We also discussed options for low-carb options such as low-carb tortillas We also discussed the mechanism of action of  metformin and the importance of taking it with meals and to go back to taking it 3 times a day.    MEDICATIONS:  Continue Toujeo 34 units daily  Continue NovoLog 4 units with breakfast  Increase NovoLog to 6 units with lunch and supper  EDUCATION / INSTRUCTIONS:  BG monitoring instructions: Patient is instructed to check her blood sugars 4 times a day, before meals and at bedtime.  Call [facility] clinic if: BG persistently < 70 or > 300. . I reviewed the Rule of 15 for the treatment of hypoglycemia in detail with the patient. Literature supplied.    2) Diabetic complications:   Eye: Does have known diabetic retinopathy. Last eye exam was less 1 year ago.   Neuro/ Feet: Does have known diabetic peripheral neuropathy .   Renal: Patient does not have known baseline CKD. She is on an ACEI/ARB at present.   3) Lipids: Patient is on a statin.  4) Hypertension: She is at goal of < 140/90 mmHg.   Time spent with the patient was 30 minutes.  More than 50% of the time was spent in counseling and answering questions.  F/U in 2 month   Signed electronically by: Lyndle Herrlich, MD  [facility] Endocrinology  [facility] [address] [place], [place] [address] Phone: [phone] FAX: [phone]   CC: Marisue Brooklyn [address] [facility] [address] [place] [address] Phone: [phone]  Fax: [phone]  Return to Endocrinology clinic as below: Future Appointments  Date Time Provider Department Center  04/08/2018  9:50 AM Shamleffer, Konrad Dolores, MD [facility] None  05/26/2018 10:15 AM Reather Littler, MD [facility] None

## 2018-04-08 ENCOUNTER — Encounter: Payer: Self-pay | Admitting: Internal Medicine

## 2018-04-08 ENCOUNTER — Ambulatory Visit: Payer: BLUE CROSS/BLUE SHIELD | Admitting: Internal Medicine

## 2018-04-08 VITALS — BP 122/82 | HR 75 | Ht 62.0 in | Wt 182.6 lb

## 2018-04-08 DIAGNOSIS — E785 Hyperlipidemia, unspecified: Secondary | ICD-10-CM

## 2018-04-08 DIAGNOSIS — Z794 Long term (current) use of insulin: Secondary | ICD-10-CM | POA: Diagnosis not present

## 2018-04-08 DIAGNOSIS — E1165 Type 2 diabetes mellitus with hyperglycemia: Secondary | ICD-10-CM | POA: Diagnosis not present

## 2018-04-08 DIAGNOSIS — I10 Essential (primary) hypertension: Secondary | ICD-10-CM | POA: Diagnosis not present

## 2018-04-08 MED ORDER — INSULIN GLARGINE 300 UNIT/ML ~~LOC~~ SOPN: 34.0000 [IU] | PEN_INJECTOR | Freq: Every day | SUBCUTANEOUS | 6 refills | Status: AC

## 2018-04-08 MED ORDER — METFORMIN HCL ER 500 MG PO TB24: 500.0000 mg | ORAL_TABLET | Freq: Three times a day (TID) | ORAL | 3 refills | Status: AC

## 2018-04-08 MED FILL — metFORMIN HCL ER 500 MG TB2: 500 | 30 days supply | Qty: 90 | Fill #0

## 2018-04-08 MED FILL — TOUJEO MAX SOLOSTAR 300 UNI: 300 | 53 days supply | Qty: 6 | Fill #0

## 2018-04-08 NOTE — Patient Instructions (Signed)
-   Take Metformin 500 mg three times a day with each meal - Continue Toujeo with 34 units daily - Novolog 4 units with Breakfast - Novolog 6 units with Lunch and Supper     HOW TO TREAT LOW BLOOD SUGARS (Blood sugar LESS THAN 70 MG/DL)  Please follow the RULE OF 15 for the treatment of hypoglycemia treatment (when your (blood sugars are less than 70 mg/dL)    STEP 1: Take 15 grams of carbohydrates when your blood sugar is low, which includes:   3-4 GLUCOSE TABS  OR  3-4 OZ OF JUICE OR REGULAR SODA OR  ONE TUBE OF GLUCOSE GEL     STEP 2: RECHECK blood sugar in 15 MINUTES STEP 3: If your blood sugar is still low at the 15 minute recheck --> then, go back to STEP 1 and treat AGAIN with another 15 grams of carbohydrates.

## 2018-04-12 MED FILL — AMOXICILLIN 500 MG CAPSULE: 500 | 10 days supply | Qty: 30 | Fill #0

## 2018-04-13 MED FILL — DORZOLAMIDE-TIMOLOL EYE DRP: 22.3-6.8 | 50 days supply | Qty: 10 | Fill #4

## 2018-04-13 MED FILL — BRIMONIDINE TARTRATE 0.2 %: 0.2 | 25 days supply | Qty: 5 | Fill #9

## 2018-04-15 MED FILL — OLMESARTAN-HCTZ 40-25 MG TA: 40-25 | 30 days supply | Qty: 30 | Fill #1

## 2018-05-03 ENCOUNTER — Other Ambulatory Visit: Payer: Self-pay | Admitting: Endocrinology

## 2018-05-03 MED FILL — TECHLITE PEN NDL 32GX1/4: 32G X 6 MM | 75 days supply | Qty: 300 | Fill #0

## 2018-05-03 MED FILL — TECHLITE PEN NDL 32GX1/4": 32G X 6 MM | 75 days supply | Qty: 300 | Fill #0

## 2018-05-19 MED FILL — OLMESARTAN-HCTZ 40-25 MG TA: 40-25 | 30 days supply | Qty: 30 | Fill #2

## 2018-05-19 MED FILL — ALENDRONATE NA 10 MG TAB: 10 | 30 days supply | Qty: 30 | Fill #2

## 2018-05-19 MED FILL — BRIMONIDINE TARTRATE 0.2 %: 0.2 | 25 days supply | Qty: 5 | Fill #10

## 2018-05-26 ENCOUNTER — Encounter: Payer: Self-pay | Admitting: Endocrinology

## 2018-05-26 ENCOUNTER — Ambulatory Visit: Payer: BLUE CROSS/BLUE SHIELD | Admitting: Endocrinology

## 2018-05-26 VITALS — BP 130/80 | HR 82 | Ht 62.0 in | Wt 186.6 lb

## 2018-05-26 DIAGNOSIS — I10 Essential (primary) hypertension: Secondary | ICD-10-CM | POA: Diagnosis not present

## 2018-05-26 DIAGNOSIS — E1165 Type 2 diabetes mellitus with hyperglycemia: Secondary | ICD-10-CM | POA: Diagnosis not present

## 2018-05-26 DIAGNOSIS — Z23 Encounter for immunization: Secondary | ICD-10-CM

## 2018-05-26 DIAGNOSIS — Z794 Long term (current) use of insulin: Secondary | ICD-10-CM | POA: Diagnosis not present

## 2018-05-26 LAB — GLUCOSE, POCT (MANUAL RESULT ENTRY): POC GLUCOSE: 201 mg/dL — AB (ref 70–99)

## 2018-05-26 NOTE — Patient Instructions (Addendum)
Toujeo 32 units daily  Novolog 6 at Bfst and 8 at lunch and dinner  Check blood sugars on waking up days a week  Also check blood sugars about 2 hours after meals and do this after different meals by rotation  Recommended blood sugar levels on waking up are 90-130 and about 2 hours after meal is 130-160  Please bring your blood sugar monitor to each visit, thank you  1 Starch at each meal

## 2018-05-26 NOTE — Progress Notes (Signed)
Patient ID: Cynthia Mata, female   DOB: 08/31/1945, 72 y.o.   MRN: [id number]           Reason for Appointment: Follow-up for Type 2 Diabetes  Referring physician: Saguier  History of Present Illness:          Diagnosis: Type 2 diabetes mellitus, date of diagnosis: 2001        Past history: At the time of diagnosis she had symptoms of increased thirst and urination and very high blood sugars, was obese.   She was treated in her [place] with oral medications possibly glipizide and metformin She also changed her diet and was able to lose weight  However she does not think her blood sugars had been consistently controlled In 2012 she was given Levemir insulin but not clear if this helped her sugar control as her A1c was still 9.5 in 2014 She was apparently continued on Metaglip also which he had been taking for a while; she has usually taken only 1 tablet twice a day of the 5/500 tablets.  Has not had any abdominal pain or diarrhea with metformin previously  Recent history:   INSULIN regimen is described as:Toujeo 34 in am. Novolog 4-6-6 before meals  Oral hypoglycemic drugs the patient is taking are: Metformin ER 500 tid  Her A1c was last 8.6, previously had gone up to 9%  She has not been seen since 10/2017  Current management, blood sugar patterns and problems:  She now says that her One Touch meter is not being covered by her insurance and she is using some generic monitor  Her family is helping her write down her blood sugars at breakfast and bedtime but no other time  Although she claims that she has had a couple of low sugars at 3 AM this is not being frequent  She was told to take her NovoLog consistently before meals which she thinks she is doing  However blood sugars after meals appear to be still mostly high including 206 today about 3 to 4 hours after breakfast  However her readings at night appear to be overall better than on her last visit when  she was not taking NovoLog regularly  However FASTING blood sugars from review of her home record appear to be mostly near normal without any readings below 100  She had previously had refused to consider insulin pump           Side effects from medications have been: None  Compliance with the medical regimen: Fair   Glucose monitoring:  done 2-3 times a day         Glucometer: Unknown [employer] brand       Blood Glucose readings by recall:   PRE-MEAL Fasting Lunch Dinner Bedtime Overall  Glucose range: 100-180 ?  200+     Mean/median:        POST-MEAL PC Breakfast PC Lunch PC Dinner  Glucose range:   145-225  Mean/median:      Previous readings:  PRE-MEAL Fasting Lunch Dinner  overnight Overall  Glucose range:  Not checked    88-116  88-387  Mean/median:     224   POST-MEAL PC Breakfast PC Lunch PC Dinner  Glucose range:    149-387  Mean/median:  229  238  228    Self-care: The diet that the patient has been following is: tries to limit Sweets, carbs and fats and more vegetables .      Meals: 3  meals per day. Breakfast is eggs, beans and/or bread   .   Has chicken and vegetables for lunch and dinner with some tortillas.  Has bread and nuts for snacks    Dinner is at 6-7 pm          Exercise:  walking a little 2-3 times a week       Dietician visit, most recent: 2016.               Weight history:  Wt Readings from Last 3 Encounters:  05/26/18 186 lb 9.6 oz (84.6 kg)  04/08/18 182 lb 9.6 oz (82.8 kg)  03/15/18 186 lb 9.6 oz (84.6 kg)    Glycemic control:   Lab Results  Component Value Date   HGBA1C 8.6 (H) 03/12/2018   HGBA1C 8.2 (H) 10/16/2017   HGBA1C 9.0 (H) 07/14/2017   Lab Results  Component Value Date   MICROALBUR 1.7 10/16/2017   LDLCALC 125 (H) 10/08/2017   CREATININE 0.83 03/12/2018       Allergies as of 05/26/2018   No Known Allergies     Medication List       Accurate as of May 26, 2018 10:50 AM. Always use your most recent  med list.        alendronate 10 MG tablet Commonly known as:  FOSAMAX TAKE 1 TABLET BY MOUTH DAILY BEFORE BREAKFAST. TAKE WITH A FULL GLASS OF WATER ON AN EMPTY STOMACH.   atorvastatin 20 MG tablet Commonly known as:  LIPITOR Take 1 tablet (20 mg total) by mouth daily.   brimonidine 0.2 % ophthalmic solution Commonly known as:  ALPHAGAN   dorzolamide-timolol 22.3-6.8 MG/ML ophthalmic solution Commonly known as:  COSOPT   insulin aspart 100 UNIT/ML FlexPen Commonly known as:  NOVOLOG FLEXPEN Inject 6 units with breakfast and lunch and 8 units with supper   Insulin Glargine 300 UNIT/ML Sopn Commonly known as:  TOUJEO MAX SOLOSTAR Inject 34 Units into the skin daily.   metFORMIN 500 MG 24 hr tablet Commonly known as:  GLUCOPHAGE-XR Take 1 tablet (500 mg total) by mouth 3 (three) times daily.   NIFEdipine 60 MG 24 hr tablet Commonly known as:  NIFEDIAC CC Take 1 tablet (60 mg total) by mouth daily.   olmesartan-hydrochlorothiazide 40-25 MG tablet Commonly known as:  BENICAR HCT TAKE 1 TABLET BY MOUTH DAILY.   ONE TOUCH ULTRA TEST test strip Generic drug:  glucose blood Use to check blood sugar 3 times per day dx code E11.9   glucose blood test strip Commonly known as:  ONETOUCH VERIO Use to check blood sugars 3 times daily. Dx Code E11.9   glucose blood test strip Commonly known as:  ONETOUCH VERIO Use as instructed   TECHLITE PEN NEEDLES 32G X 6 MM Misc Generic drug:  Insulin Pen Needle USE TO INJECT INSULIN FOUR TIMES DAILY       Allergies: No Known Allergies  Past Medical History:  Diagnosis Date  . Anemia   . Breast cyst   . Cataract   . Diabetes mellitus without complication (HCC)   . Hyperlipidemia   . Hypertension   . Macular degeneration   . Osteoporosis   . Thyroid disease   . Trapezius strain 10/23/2014    Past Surgical History:  Procedure Laterality Date  . ANKLE FRACTURE SURGERY     Right ankle  . CATARACT EXTRACTION    . cataracts   05-2015  . TONSILLECTOMY     Abcess removed also  Family History  Problem Relation Age of Onset  . Hypertension Maternal Aunt   . Heart disease Maternal Aunt   . CAD Maternal Grandfather   . Diabetes Neg Hx   . COPD Neg Hx     Social History:  reports that she has never smoked. She has never used smokeless tobacco. She reports that she does not drink alcohol or use drugs.    Review of Systems    Most recent eye exam was In 2/18, She does have glaucoma       Lipids: On  Rx with Lipitor 20mg          Lab Results  Component Value Date   CHOL 194 10/08/2017   HDL 47.60 10/08/2017   LDLCALC 125 (H) 10/08/2017   TRIG 105.0 10/08/2017   CHOLHDL 4 10/08/2017                    The blood pressure has been high for 15 years treated with Benicar HCT half a tablet and Procardia  by PCP     LABS:  No visits with results within 1 Week(s) from this visit.  Latest known visit with results is:  Lab on 03/12/2018  Component Date Value Ref Range Status  . Sodium 03/12/2018 142  135 - 145 mEq/L Final  . Potassium 03/12/2018 3.8  3.5 - 5.1 mEq/L Final  . Chloride 03/12/2018 104  96 - 112 mEq/L Final  . CO2 03/12/2018 30  19 - 32 mEq/L Final  . Glucose, Bld 03/12/2018 133* 70 - 99 mg/dL Final  . BUN 09/81/1914 19  6 - 23 mg/dL Final  . Creatinine, Ser 03/12/2018 0.83  0.40 - 1.20 mg/dL Final  . Calcium 78/29/5621 9.3  8.4 - 10.5 mg/dL Final  . GFR 30/86/5784 72.03  >60.00 mL/min Final  . Hgb A1c MFr Bld 03/12/2018 8.6* 4.6 - 6.5 % Final   Glycemic Control Guidelines for People with Diabetes:Non Diabetic:  <6%Goal of Therapy: <7%Additional Action Suggested:  >8%     Physical Examination:  BP 130/80 (BP Location: Left Arm, Patient Position: Sitting, Cuff Size: Normal)   Pulse 82   Ht 5\' 2"  (1.575 m)   Wt 186 lb 9.6 oz (84.6 kg)   SpO2 99%   BMI 34.13 kg/m      ASSESSMENT/PLAN:  Diabetes type 2, uncontrolled with obesity and insulin dependence  See history of present  illness for current blood sugar patterns, problems identified and current management  Last A1c was 8.2  She needs to be treated with basal bolus insulin regimen and also metformin Currently she appears to be getting inadequate doses of mealtime insulin Also with not getting a reliable glucose monitor not clear how accurate her home readings are She has done a little better with trying to take NovoLog before each meal Her diet appears to be usually reasonable with low fat content  Recommendations:  She will increase her NovoLog by 2 units with all her meals  Discussed blood sugar targets after meals  Although she is afraid of overnight hypoglycemia her fasting readings are mostly over 100; however as a precaution will reduce her Toujeo by 2 units for now since bedtime readings may improve  She needs to make sure she has a carbohydrate serving with her evening meal consistently  She will call her insurance to see which meter is preferred and will call in the prescription accordingly  Again discussed frequency and timing of glucose monitoring and blood sugar targets  Encouraged her to walk regularly for exercise and weight loss    HYPERTENSION: Blood pressure is ok with Benicar HCT   LIPIDS: Discussed that she will need to have this checked again on her next visit with PCP who is prescribing her atorvastatin  Explained to the patient that she has not had her influenza vaccine and stressed importance of doing this, high-dose influenza vaccine was given  Patient Instructions  Toujeo 32 units daily  Novolog 4 at Bfst and 8 at lunch and dinner  Check blood sugars on waking up days a week  Also check blood sugars about 2 hours after meals and do this after different meals by rotation  Recommended blood sugar levels on waking up are 90-130 and about 2 hours after meal is 130-160  Please bring your blood sugar monitor to each visit, thank you   Counseling time on subjects  discussed in assessment and plan sections is over 50% of today's 25 minute visit       Reather Littler 05/26/2018, 10:50 AM   Note: This office note was prepared with Dragon voice recognition system technology. Any transcriptional errors that result from this process are unintentional.

## 2018-05-28 ENCOUNTER — Other Ambulatory Visit: Payer: Self-pay

## 2018-05-28 MED ORDER — ONETOUCH ULTRA BLUE VI STRP: ORAL_STRIP | 3 refills | Status: AC

## 2018-05-31 MED FILL — DORZOLAMIDE-TIMOLOL EYE DRP: 22.3-6.8 | 50 days supply | Qty: 10 | Fill #0

## 2018-06-03 MED FILL — NIFEdipine ER 60 MG TB24: 60 | 30 days supply | Qty: 30 | Fill #1

## 2018-06-03 MED FILL — TOUJEO MAX SOLOSTAR 300 UNI: 300 | 53 days supply | Qty: 6 | Fill #1

## 2018-06-07 MED FILL — BRIMONIDINE TARTRATE 0.2 %: 0.2 | 50 days supply | Qty: 10 | Fill #0

## 2018-06-22 MED FILL — OLMESARTAN-HCTZ 40-25 MG TA: 40-25 | 30 days supply | Qty: 30 | Fill #3

## 2018-07-02 MED FILL — BRIMONIDINE TARTRATE 0.2 %: 0.2 | 50 days supply | Qty: 10 | Fill #0

## 2018-07-05 ENCOUNTER — Telehealth: Payer: Self-pay | Admitting: Medical

## 2018-07-05 ENCOUNTER — Ambulatory Visit: Payer: BLUE CROSS/BLUE SHIELD | Admitting: Medical

## 2018-07-05 DIAGNOSIS — Z0289 Encounter for other administrative examinations: Secondary | ICD-10-CM

## 2018-07-05 NOTE — Telephone Encounter (Signed)
Was pt a complete no show. If so then charge.

## 2018-07-22 ENCOUNTER — Other Ambulatory Visit: Payer: Self-pay

## 2018-07-22 ENCOUNTER — Telehealth: Payer: Self-pay | Admitting: Endocrinology

## 2018-07-22 ENCOUNTER — Other Ambulatory Visit: Payer: Self-pay | Admitting: Medical

## 2018-07-22 MED ORDER — OLMESARTAN MEDOXOMIL-HCTZ 40-25 MG PO TABS: 1.0000 | ORAL_TABLET | Freq: Every day | ORAL | 3 refills | Status: DC

## 2018-07-22 MED FILL — NIFEdipine ER 60 MG TB24: 60 | 30 days supply | Qty: 30 | Fill #2

## 2018-07-22 MED FILL — DORZOLAMIDE-TIMOLOL EYE DRP: 22.3-6.8 | 50 days supply | Qty: 10 | Fill #1

## 2018-07-22 MED FILL — OLMESARTAN-HCTZ 40-25 MG TA: 40-25 | 30 days supply | Qty: 30 | Fill #0

## 2018-07-22 NOTE — Telephone Encounter (Signed)
Rx sent 

## 2018-07-22 NOTE — Telephone Encounter (Signed)
MEDICATION: olmesartan-hydrochlorothiazide (BENICAR HCT) 40-25 MG tablet  PHARMACY:  [facility] - [place], [place] - [address]  IS THIS A 90 DAY SUPPLY :  30 day supply   IS PATIENT OUT OF MEDICATION: yes  IF NOT; HOW MUCH IS LEFT:   LAST APPOINTMENT DATE: @12 /20/2019  NEXT APPOINTMENT DATE:@2 /17/2020  DO WE HAVE YOUR PERMISSION TO LEAVE A DETAILED MESSAGE:  OTHER COMMENTS:    **Let patient know to contact pharmacy at the end of the day to make sure medication is ready. **  ** Please notify patient to allow 48-72 hours to process**  **Encourage patient to contact the pharmacy for refills or they can request refills through [facility]**

## 2018-07-26 ENCOUNTER — Other Ambulatory Visit: Payer: BLUE CROSS/BLUE SHIELD

## 2018-07-28 ENCOUNTER — Ambulatory Visit: Payer: BLUE CROSS/BLUE SHIELD | Admitting: Endocrinology

## 2018-08-03 ENCOUNTER — Telehealth: Payer: Self-pay | Admitting: Endocrinology

## 2018-08-03 NOTE — Telephone Encounter (Signed)
LMTCB and reschedule NS Appt from 07/26/2018 (lab) 07/28/2018 (Dr Lucianne Muss)

## 2018-08-16 MED FILL — TOUJEO MAX SOLOSTAR 300 UNI: 300 | 53 days supply | Qty: 6 | Fill #2

## 2018-08-16 MED FILL — ALENDRONATE NA 10 MG TAB: 10 | 30 days supply | Qty: 30 | Fill #3

## 2018-08-16 MED FILL — NOVOLOG FLEXPEN SYRINGE: 100 | 90 days supply | Qty: 18 | Fill #1

## 2018-08-26 MED FILL — BRIMONIDINE TARTRATE 0.2 %: 0.2 | 50 days supply | Qty: 10 | Fill #1

## 2018-08-27 MED FILL — OLMESARTAN-HCTZ 40-25 MG TA: 40-25 | 30 days supply | Qty: 30 | Fill #1

## 2018-08-27 MED FILL — DORZOLAMIDE HCL-TIMOLOL MAL: 22.3-6.8 | 50 days supply | Qty: 10 | Fill #1

## 2018-09-07 MED FILL — TECHLITE PEN NDL 32GX1/4: 32G X 6 MM | 75 days supply | Qty: 300 | Fill #1

## 2018-09-07 MED FILL — TECHLITE PEN NDL 32GX1/4": 32G X 6 MM | 75 days supply | Qty: 300 | Fill #1

## 2018-09-15 ENCOUNTER — Telehealth: Payer: Self-pay | Admitting: Medical

## 2018-09-15 NOTE — Telephone Encounter (Signed)
Looks like pt due for office visit/ blood pressure check. Cynthia Mata sent message to pt. Will you call and let her know she can set up virtual visit. Her son should be able to help get her set up.If she schedules I want her to check her bp and pulse at home or at pharmacy day of virtual visit.

## 2018-09-16 NOTE — Telephone Encounter (Signed)
Spoke with pt and also pt put daughter on the phone to explain that pt has insurance [employer] [facility]) that is in network and that our office is NOT in network with her insurance. Pt had to change PCP. Pt wanted to let provider know Thank you for being there for the pt and is sorry that had to change provider due to insurance. Done

## 2018-10-04 ENCOUNTER — Other Ambulatory Visit: Payer: Self-pay | Admitting: Medical

## 2018-10-04 ENCOUNTER — Other Ambulatory Visit: Payer: Self-pay

## 2018-10-04 MED ORDER — NIFEDIPINE ER 60 MG PO TB24: ORAL_TABLET | ORAL | 0 refills | Status: DC

## 2018-10-04 MED FILL — NIFEdipine ER 60 MG TB24: 60 | 30 days supply | Qty: 30 | Fill #0

## 2018-10-11 MED FILL — OLMESARTAN MEDOXOMIL 40 MG: 40 | 30 days supply | Qty: 30 | Fill #0

## 2018-10-11 MED FILL — HYDROCHLOROTHIAZIDE 25 MG T: 25 | 30 days supply | Qty: 30 | Fill #0

## 2018-10-22 MED FILL — DORZOLAMIDE HCL-TIMOLOL MAL: 22.3-6.8 | 50 days supply | Qty: 10 | Fill #2

## 2018-10-22 MED FILL — BRIMONIDINE TARTRATE 0.2 %: 0.2 | 50 days supply | Qty: 10 | Fill #2

## 2018-10-22 MED FILL — TOUJEO MAX SOLOSTAR 300 UNI: 300 | 53 days supply | Qty: 6 | Fill #3

## 2018-11-08 MED FILL — ATORVASTATIN 20 MG TABLET: 20 | 90 days supply | Qty: 90 | Fill #0

## 2018-11-08 MED FILL — AMLODIPINE BESYLATE 5 MG TA: 5 | 30 days supply | Qty: 30 | Fill #0

## 2018-11-09 MED FILL — NOVOLOG FLEXPEN SYRINGE: 100 | 90 days supply | Qty: 18 | Fill #2

## 2018-11-09 MED FILL — OLMESARTAN-HCTZ 40-25 MG TA: 40-25 | 30 days supply | Qty: 30 | Fill #0

## 2018-12-02 MED FILL — metFORMIN HCL ER 500 MG TB2: 500 | 30 days supply | Qty: 90 | Fill #1

## 2018-12-07 MED FILL — OLMESARTAN-HCTZ 40-25 MG TA: 40-25 | 30 days supply | Qty: 30 | Fill #1

## 2018-12-07 MED FILL — AMLODIPINE BESYLATE 5 MG TA: 5 | 30 days supply | Qty: 30 | Fill #0

## 2018-12-13 MED FILL — BRIMONIDINE 0.2% EYE DROP: 0.2 | 50 days supply | Qty: 10 | Fill #0

## 2018-12-13 MED FILL — DORZOLAMIDE-TIMOLOL EYE DRP: 22.3-6.8 | 50 days supply | Qty: 10 | Fill #2

## 2018-12-27 MED FILL — TECHLITE PEN NDL 32GX1/4": 32G X 6 MM | 75 days supply | Qty: 300 | Fill #2

## 2018-12-27 MED FILL — TOUJEO MAX SOLOSTAR 300 UNI: 300 | 53 days supply | Qty: 6 | Fill #4

## 2018-12-27 MED FILL — METFORMIN HCL ER 500 MG TB2: 500 | 30 days supply | Qty: 90 | Fill #2

## 2018-12-27 MED FILL — TECHLITE PEN NDL 32GX1/4: 32G X 6 MM | 75 days supply | Qty: 300 | Fill #2

## 2019-01-03 MED FILL — OLMESARTAN-HCTZ 40-25 MG TA: 40-25 | 30 days supply | Qty: 30 | Fill #2

## 2019-01-06 MED FILL — VIT D2 1.25 MG (50,000 UNIT: 1.25 MG | 84 days supply | Qty: 12 | Fill #0

## 2019-01-07 MED FILL — BRIMONIDINE 0.2% EYE DROP: 0.2 | 50 days supply | Qty: 10 | Fill #1

## 2019-01-11 MED FILL — AMLODIPINE BESYLATE 5 MG TA: 5 | 30 days supply | Qty: 30 | Fill #0

## 2019-01-31 MED FILL — NOVOLOG FLEXPEN SYRINGE: 100 | 90 days supply | Qty: 18 | Fill #3

## 2019-02-02 MED FILL — DORZOLAMIDE-TIMOLOL EYE DRP: 22.3-6.8 | 50 days supply | Qty: 10 | Fill #3

## 2019-02-02 MED FILL — OLMESARTAN-HCTZ 40-25 MG TA: 40-25 | 30 days supply | Qty: 30 | Fill #3

## 2019-02-11 MED FILL — AMLODIPINE BESYLATE 5 MG TA: 5 | 30 days supply | Qty: 30 | Fill #1

## 2019-02-24 MED FILL — metFORMIN HCL ER 500 MG TB2: 500 | 45 days supply | Qty: 90 | Fill #0

## 2019-02-24 MED FILL — ATORVASTATIN 20 MG TABLET: 20 | 90 days supply | Qty: 90 | Fill #0

## 2019-02-28 MED FILL — OLMESARTAN-HCTZ 40-25 MG TA: 40-25 | 90 days supply | Qty: 90 | Fill #0

## 2019-03-03 MED FILL — BRIMONIDINE 0.2% EYE DROP: 0.2 | 50 days supply | Qty: 10 | Fill #2

## 2019-03-08 MED FILL — TOUJEO MAX SOLOSTAR 300 UNI: 300 | 53 days supply | Qty: 6 | Fill #5

## 2019-03-18 MED FILL — AMLODIPINE BESYLATE 5 MG TA: 5 | 30 days supply | Qty: 30 | Fill #2

## 2019-04-01 MED FILL — DORZOLAMIDE HCL-TIMOLOL MAL: 22.3-6.8 | 50 days supply | Qty: 10 | Fill #4

## 2019-04-01 MED FILL — TECHLITE PEN NDL 32GX1/4": 32G X 6 MM | 75 days supply | Qty: 300 | Fill #3

## 2019-04-01 MED FILL — TECHLITE PEN NDL 32GX1/4: 32G X 6 MM | 75 days supply | Qty: 300 | Fill #3

## 2019-04-12 ENCOUNTER — Other Ambulatory Visit: Payer: Self-pay | Admitting: Endocrinology

## 2019-04-14 MED FILL — NOVOLOG FLEXPEN SYRINGE: 100 | 90 days supply | Qty: 18 | Fill #0

## 2019-04-20 MED FILL — BRIMONIDINE TARTRATE 0.2 %: 0.2 | 30 days supply | Qty: 10 | Fill #0

## 2019-04-26 MED FILL — TOUJEO MAX SOLOSTAR 300 UNI: 300 | 53 days supply | Qty: 6 | Fill #0

## 2019-04-27 MED FILL — AMLODIPINE BESYLATE 5 MG TA: 5 | 30 days supply | Qty: 30 | Fill #3

## 2019-04-27 MED FILL — BRIMONIDINE TARTRATE 0.2 %: 0.2 | 50 days supply | Qty: 10 | Fill #3

## 2019-05-09 MED FILL — DORZOLAMIDE HCL-TIMOLOL MAL: 22.3-6.8 | 50 days supply | Qty: 10 | Fill #0

## 2019-05-19 MED FILL — BRIMONIDINE TARTRATE 0.2 %: 0.2 | 30 days supply | Qty: 10 | Fill #1

## 2019-05-19 MED FILL — DORZOLAMIDE HCL-TIMOLOL MAL: 22.3-6.8 | 50 days supply | Qty: 10 | Fill #5

## 2019-05-31 MED FILL — LATANOPROST 0.005% OPTH SOL: 0.005 | 25 days supply | Qty: 3 | Fill #0

## 2019-06-01 MED FILL — OLMESARTAN-HCTZ 40-25 MG TA: 40-25 | 90 days supply | Qty: 90 | Fill #1

## 2019-06-01 MED FILL — AMLODIPINE BESYLATE 5 MG TA: 5 | 30 days supply | Qty: 30 | Fill #0

## 2019-06-14 MED FILL — TOUJEO MAX SOLOSTAR 300 UNI: 300 | 79 days supply | Qty: 9 | Fill #0

## 2019-06-14 MED FILL — AMLODIPINE BESYLATE 10 MG T: 10 | 30 days supply | Qty: 30 | Fill #0

## 2019-06-21 MED FILL — VIT D2 1.25 MG (50,000 UNIT: 1.25 MG | 28 days supply | Qty: 4 | Fill #0

## 2019-06-29 MED FILL — NOVOLOG FLEXPEN SYRINGE: 100 | 15 days supply | Qty: 3 | Fill #0

## 2019-07-01 ENCOUNTER — Other Ambulatory Visit: Payer: Self-pay | Admitting: Endocrinology

## 2019-07-01 MED FILL — VIT D2 1.25 MG (50,000 UNIT: 1.25 MG | 28 days supply | Qty: 4 | Fill #0

## 2019-07-01 MED FILL — DORZOLAMIDE HCL-TIMOLOL MAL: 22.3-6.8 | 50 days supply | Qty: 10 | Fill #0

## 2019-07-01 MED FILL — BRIMONIDINE TARTRATE 0.2 %: 0.2 | 30 days supply | Qty: 10 | Fill #2

## 2019-07-04 ENCOUNTER — Telehealth: Payer: Self-pay

## 2019-07-04 ENCOUNTER — Other Ambulatory Visit: Payer: Self-pay | Admitting: Endocrinology

## 2019-07-04 NOTE — Telephone Encounter (Signed)
Called to make f/u and daughter informed me that patient has another Endocrinologist due to insurance not covering her for visits here-FYI

## 2019-07-12 MED FILL — LATANOPROST 0.005% OPTH SOL: 0.005 | 25 days supply | Qty: 3 | Fill #1

## 2019-07-12 MED FILL — NOVOLOG FLEXPEN SYRINGE: 100 | 75 days supply | Qty: 15 | Fill #1

## 2019-07-19 MED FILL — AMLODIPINE BESYLATE 10 MG T: 10 | 90 days supply | Qty: 90 | Fill #0

## 2019-07-19 MED FILL — VIT D2 1.25 MG (50,000 UNIT: 1.25 MG | 28 days supply | Qty: 4 | Fill #0

## 2019-07-20 MED FILL — ULTICARE PEN NDL 4MM 32G: 32G X 4 MM | 25 days supply | Qty: 100 | Fill #0

## 2019-07-25 MED FILL — VICTOZA 18 MG/3 ML INJECT P: 18 | 30 days supply | Qty: 9 | Fill #0

## 2019-08-03 MED FILL — AMLODIPINE BESYLATE 10 MG T: 10 | 30 days supply | Qty: 30 | Fill #1

## 2019-08-13 ENCOUNTER — Emergency Department (HOSPITAL_BASED_OUTPATIENT_CLINIC_OR_DEPARTMENT_OTHER): Payer: Medicaid Other

## 2019-08-13 ENCOUNTER — Other Ambulatory Visit: Payer: Self-pay

## 2019-08-13 ENCOUNTER — Encounter (HOSPITAL_BASED_OUTPATIENT_CLINIC_OR_DEPARTMENT_OTHER): Payer: Self-pay | Admitting: *Deleted

## 2019-08-13 ENCOUNTER — Observation Stay (HOSPITAL_BASED_OUTPATIENT_CLINIC_OR_DEPARTMENT_OTHER)
Admission: EM | Admit: 2019-08-13 | Discharge: 2019-08-15 | Disposition: A | Discharge status: Home / Self Care | Payer: Medicaid Other | Attending: Internal Medicine | Admitting: Internal Medicine

## 2019-08-13 DIAGNOSIS — I700 Atherosclerosis of aorta: Secondary | ICD-10-CM | POA: Diagnosis not present

## 2019-08-13 DIAGNOSIS — E039 Hypothyroidism, unspecified: Secondary | ICD-10-CM | POA: Diagnosis not present

## 2019-08-13 DIAGNOSIS — R29898 Other symptoms and signs involving the musculoskeletal system: Secondary | ICD-10-CM | POA: Diagnosis not present

## 2019-08-13 DIAGNOSIS — Z20822 Contact with and (suspected) exposure to covid-19: Secondary | ICD-10-CM | POA: Diagnosis not present

## 2019-08-13 DIAGNOSIS — I10 Essential (primary) hypertension: Secondary | ICD-10-CM | POA: Diagnosis not present

## 2019-08-13 DIAGNOSIS — E119 Type 2 diabetes mellitus without complications: Secondary | ICD-10-CM

## 2019-08-13 DIAGNOSIS — Z794 Long term (current) use of insulin: Secondary | ICD-10-CM | POA: Diagnosis not present

## 2019-08-13 DIAGNOSIS — R531 Weakness: Secondary | ICD-10-CM | POA: Diagnosis present

## 2019-08-13 DIAGNOSIS — Z79899 Other long term (current) drug therapy: Secondary | ICD-10-CM | POA: Insufficient documentation

## 2019-08-13 LAB — CBC WITH DIFFERENTIAL/PLATELET
Abs Immature Granulocytes: 0.03 10*3/uL (ref 0.00–0.07)
Basophils Absolute: 0 10*3/uL (ref 0.0–0.1)
Basophils Relative: 0 %
Eosinophils Absolute: 0.1 10*3/uL (ref 0.0–0.5)
Eosinophils Relative: 1 %
HCT: 35.7 % — ABNORMAL LOW (ref 36.0–46.0)
Hemoglobin: 11.4 g/dL — ABNORMAL LOW (ref 12.0–15.0)
Immature Granulocytes: 0 %
Lymphocytes Relative: 29 %
Lymphs Abs: 2.3 10*3/uL (ref 0.7–4.0)
MCH: 29.5 pg (ref 26.0–34.0)
MCHC: 31.9 g/dL (ref 30.0–36.0)
MCV: 92.2 fL (ref 80.0–100.0)
Monocytes Absolute: 0.5 10*3/uL (ref 0.1–1.0)
Monocytes Relative: 6 %
Neutro Abs: 4.9 10*3/uL (ref 1.7–7.7)
Neutrophils Relative %: 64 %
Platelets: 274 10*3/uL (ref 150–400)
RBC: 3.87 MIL/uL (ref 3.87–5.11)
RDW: 13.3 % (ref 11.5–15.5)
WBC: 7.9 10*3/uL (ref 4.0–10.5)
nRBC: 0 % (ref 0.0–0.2)

## 2019-08-13 LAB — BASIC METABOLIC PANEL
Anion gap: 12 (ref 5–15)
BUN: 24 mg/dL — ABNORMAL HIGH (ref 8–23)
CO2: 27 mmol/L (ref 22–32)
Calcium: 9.3 mg/dL (ref 8.9–10.3)
Chloride: 97 mmol/L — ABNORMAL LOW (ref 98–111)
Creatinine, Ser: 1.06 mg/dL — ABNORMAL HIGH (ref 0.44–1.00)
GFR calc Af Amer: >60 mL/min (ref >60)
GFR calc non Af Amer: 52 mL/min — ABNORMAL LOW (ref >60)
Glucose, Bld: 211 mg/dL — ABNORMAL HIGH (ref 70–99)
Potassium: 3.3 mmol/L — ABNORMAL LOW (ref 3.5–5.1)
Sodium: 136 mmol/L (ref 135–145)

## 2019-08-13 LAB — URINALYSIS, ROUTINE W REFLEX MICROSCOPIC
Bilirubin Urine: NEGATIVE
Glucose, UA: NEGATIVE mg/dL
Hgb urine dipstick: NEGATIVE
Ketones, ur: NEGATIVE mg/dL
Nitrite: NEGATIVE
Protein, ur: NEGATIVE mg/dL
Specific Gravity, Urine: 1.01 (ref 1.005–1.030)
pH: 6 (ref 5.0–8.0)

## 2019-08-13 LAB — URINALYSIS, MICROSCOPIC (REFLEX)

## 2019-08-13 LAB — PROTIME-INR
INR: 0.9 (ref 0.8–1.2)
Prothrombin Time: 11.7 seconds (ref 11.4–15.2)

## 2019-08-13 MED ORDER — ASPIRIN 81 MG PO CHEW
324.0000 mg | CHEWABLE_TABLET | Freq: Once | ORAL | Status: AC
  Administered 2019-08-14: 324 mg via ORAL
  Filled 2019-08-13: qty 4

## 2019-08-13 MED ORDER — IOHEXOL 350 MG/ML SOLN
100.0000 mL | Freq: Once | INTRAVENOUS | Status: AC | PRN
  Administered 2019-08-14: 100 mL via INTRAVENOUS

## 2019-08-13 MED ORDER — CLOPIDOGREL BISULFATE 300 MG PO TABS
300.0000 mg | ORAL_TABLET | Freq: Once | ORAL | Status: AC
  Administered 2019-08-14: 300 mg via ORAL
  Filled 2019-08-13: qty 1

## 2019-08-13 NOTE — ED Triage Notes (Signed)
Pt's family reports pt had and episode of left arm becoming uncoordinated this evening around 2110 while pt was making her bed. Reports similar episode yesterday around 5pm. Sx resolved at present. Grips equal and strong, no drift, normal speech (per pt's family), facial symmetry present. Pt alert. Does not speak english. Pt's daughter (who is pt's caregiver) is present in triage and giving history

## 2019-08-13 NOTE — ED Provider Notes (Addendum)
[facility] EMERGENCY DEPARTMENT Provider Note  CSN: [id number] Arrival date & time: 08/13/19 2128  Chief Complaint(s) Extremity Weakness  HPI Cynthia Mata is a 74 y.o. female with a past medical history listed below including hypertension, hyperlipidemia, diabetes and macular degeneration who presents to the emergency department with 2 episodes of left upper extremity weakness.  Patient reports that episodes have lasted approximately 30 minutes.  Last of which was around 9 or 10 PM this evening.  She reports that this last time she was attempting to put on her eyedrops when her arm fell onto her face.  She was unable to move it for the duration of the time.  Her symptoms self resolved.  She denied any falls or traumas.  No recent fevers or infections.  No associated chest pain or shortness of breath.  No nausea or vomiting.  No lower extremity weakness.  No other focal deficits.  No other physical complaints.  HPI   Past Medical History Past Medical History:  Diagnosis Date  . Anemia   . Breast cyst   . Cataract   . Diabetes mellitus without complication (HCC)   . Hyperlipidemia   . Hypertension   . Macular degeneration   . Osteoporosis   . Thyroid disease   . Trapezius strain 10/23/2014   Patient Active Problem List   Diagnosis Date Noted  . Weakness of left upper extremity 08/14/2019  . Cerumen impaction 09/12/2015  . Injury of right toe 07/23/2015  . LBBB (left bundle branch block) 04/20/2015  . Pain in joint, shoulder region 11/28/2014  . Trapezius strain 10/23/2014  . Lymphadenopathy 10/23/2014  . Chest pressure 08/11/2014  . Vision changes 08/11/2014  . Chest pain 08/11/2014  . Diabetes mellitus type 2, controlled (HCC) 08/11/2014  . Pain in the chest   . Anemia 06/29/2014  . Hypothyroid 06/29/2014  . Hyperlipidemia 06/29/2014  . Sinusitis, acute maxillary 06/29/2014  . DM (diabetes mellitus) (HCC) 05/31/2013  . Essential hypertension, benign  05/31/2013   Home Medication(s) Prior to Admission medications   Medication Sig Start Date End Date Taking? Authorizing Provider  alendronate (FOSAMAX) 10 MG tablet TAKE 1 TABLET BY MOUTH DAILY BEFORE BREAKFAST. TAKE WITH A FULL GLASS OF WATER ON AN EMPTY STOMACH. 10/21/17   Saguier, Ramon Dredge, PA-C  atorvastatin (LIPITOR) 20 MG tablet Take 1 tablet (20 mg total) by mouth daily. 12/30/16   Saguier, Ramon Dredge, PA-C  brimonidine [facility]) 0.2 % ophthalmic solution  07/24/17   [provider]  dorzolamide-timolol (COSOPT) 22.3-6.8 MG/ML ophthalmic solution  07/24/17   [provider]  glucose blood (ONETOUCH VERIO) test strip Use to check blood sugars 3 times daily. Dx Code E11.9 02/16/17   Reather Littler, MD  glucose blood [facility] VERIO) test strip Use as instructed 03/15/18   Reather Littler, MD  insulin aspart (NOVOLOG FLEXPEN) 100 UNIT/ML FlexPen Inject 6 units with breakfast and lunch and 8 units with supper 03/15/18   Reather Littler, MD  Insulin Glargine (TOUJEO MAX SOLOSTAR) 300 UNIT/ML SOPN Inject 34 Units into the skin daily. 04/08/18   Shamleffer, Konrad Dolores, MD  metFORMIN (GLUCOPHAGE-XR) 500 MG 24 hr tablet Take 1 tablet (500 mg total) by mouth 3 (three) times daily. 04/08/18   Shamleffer, Konrad Dolores, MD  NIFEdipine (NIFEDIAC CC) 60 MG 24 hr tablet Take 1 tablet by mouth once daily. (patient must be seen in office for additional refills.) 10/04/18   Reather Littler, MD  olmesartan-hydrochlorothiazide (BENICAR HCT) 40-25 MG tablet TAKE 1 TABLET  BY MOUTH DAILY. 07/23/18   Saguier, Ramon Dredge, PA-C  olmesartan-hydrochlorothiazide (BENICAR HCT) 40-25 MG tablet Take 1 tablet by mouth daily. 07/22/18   Reather Littler, MD  ONE TOUCH ULTRA TEST test strip Use to check blood sugar 3 times per day dx code E11.9 05/28/18   Reather Littler, MD  TECHLITE PEN NEEDLES 32G X 6 MM MISC USE TO INJECT INSULIN FOUR TIMES DAILY 05/03/18   Reather Littler, MD                                                                                                                                     Past Surgical History Past Surgical History:  Procedure Laterality Date  . ANKLE FRACTURE SURGERY     Right ankle  . CATARACT EXTRACTION    . cataracts  05-2015  . TONSILLECTOMY     Abcess removed also   Family History Family History  Problem Relation Age of Onset  . Hypertension Maternal Aunt   . Heart disease Maternal Aunt   . CAD Maternal Grandfather   . Diabetes Neg Hx   . COPD Neg Hx     Social History Social History   Tobacco Use  . Smoking status: Never Smoker  . Smokeless tobacco: Never Used  Substance Use Topics  . Alcohol use: No    Alcohol/week: 0.0 standard drinks  . Drug use: No   Allergies Patient has no known allergies.  Review of Systems Review of Systems All other systems are reviewed and are negative for acute change except as noted in the HPI  Physical Exam Vital Signs  I have reviewed the triage vital signs BP (!) 161/72 (BP Location: Right Arm)   Pulse 80   Temp 98.2 F (36.8 C) (Oral)   Resp 17   Ht 5' (1.524 m)   Wt 81.6 kg   SpO2 100%   BMI 35.15 kg/m   Physical Exam Vitals reviewed.  Constitutional:      General: She is not in acute distress.    Appearance: She is well-developed. She is not diaphoretic.  HENT:     Head: Normocephalic and atraumatic.     Nose: Nose normal.  Eyes:     General: No scleral icterus.       Right eye: No discharge.        Left eye: No discharge.     Conjunctiva/sclera: Conjunctivae normal.     Pupils: Pupils are equal, round, and reactive to light.  Cardiovascular:     Rate and Rhythm: Normal rate and regular rhythm.     Heart sounds: No murmur. No friction rub. No gallop.   Pulmonary:     Effort: Pulmonary effort is normal. No respiratory distress.     Breath sounds: Normal breath sounds. No stridor. No rales.  Abdominal:     General: There is no distension.     Palpations: Abdomen is soft.  Tenderness: There is no  abdominal tenderness.  Musculoskeletal:        General: No tenderness.     Cervical back: Normal range of motion and neck supple.  Skin:    General: Skin is warm and dry.     Findings: No erythema or rash.  Neurological:     Mental Status: She is alert and oriented to person, place, and time.     Comments: Mental Status:  Alert and oriented to person, place, and time.  Attention and concentration normal.  Speech clear.  Recent memory is intact  Cranial Nerves:  II Visual Fields: Intact to confrontation. Limited central vision III, IV, VI: Pupils equal and reactive to light and near. Full eye movement without nystagmus  V Facial Sensation: Normal. No weakness of masticatory muscles  VII: No facial weakness or asymmetry  VIII Auditory Acuity: Grossly normal  IX/X: The uvula is midline; the palate elevates symmetrically  XI: Normal sternocleidomastoid and trapezius strength  XII: The tongue is midline. No atrophy or fasciculations.   Motor System: Muscle Strength: 5/5 and symmetric in the upper and lower extremities. No pronation or drift.  Muscle Tone: Tone and muscle bulk are normal in the upper and lower extremities.   Reflexes: DTRs: 1+ and symmetrical in all four extremities. No Clonus Coordination: limited due to her macular degeneration but intact finger-to-nose, heel-to-shin. No tremor.  Sensation: Intact to light touch.st.  Gait: deferred.      ED Results and Treatments Labs (all labs ordered are listed, but only abnormal results are displayed) Labs Reviewed  BASIC METABOLIC PANEL - Abnormal; Notable for the following components:      Result Value   Potassium 3.3 (*)    Chloride 97 (*)    Glucose, Bld 211 (*)    BUN 24 (*)    Creatinine, Ser 1.06 (*)    GFR calc non Af Amer 52 (*)    All other components within normal limits  CBC WITH DIFFERENTIAL/PLATELET - Abnormal; Notable for the following components:   Hemoglobin 11.4 (*)    HCT 35.7 (*)    All other  components within normal limits  URINALYSIS, ROUTINE W REFLEX MICROSCOPIC - Abnormal; Notable for the following components:   Leukocytes,Ua SMALL (*)    All other components within normal limits  URINALYSIS, MICROSCOPIC (REFLEX) - Abnormal; Notable for the following components:   Bacteria, UA RARE (*)    All other components within normal limits  SARS CORONAVIRUS 2 (TAT 6-24 HRS)  PROTIME-INR                                                                                                                         EKG  EKG Interpretation  Date/Time:  Saturday August 13 2019 22:30:01 EST Ventricular Rate:  79 PR Interval:    QRS Duration: 163 QT Interval:  444 QTC Calculation: 509 R Axis:   28 Text Interpretation: Sinus rhythm Left bundle branch block Baseline wander in  lead(s) V6 No acute changes Confirmed by Drema Pry [phone]) on 08/14/2019 12:41:37 AM      Radiology CT Angio Head W or Wo Contrast  Result Date: 08/14/2019 CLINICAL DATA:  TIA.  Episodes of left arm weakness. EXAM: CT ANGIOGRAPHY HEAD AND NECK TECHNIQUE: Multidetector CT imaging of the head and neck was performed using the standard protocol during bolus administration of intravenous contrast. Multiplanar CT image reconstructions and MIPs were obtained to evaluate the vascular anatomy. Carotid stenosis measurements (when applicable) are obtained utilizing NASCET criteria, using the distal internal carotid diameter as the denominator. CONTRAST:  OMNIPAQUE IOHEXOL 350 MG/ML SOLN COMPARISON:  Head CT earlier same day FINDINGS: CTA NECK FINDINGS Aortic arch: Minimal aortic atherosclerosis. No aneurysm or dissection. Branching pattern is normal without origin stenosis. Right carotid system: Common carotid artery widely patent to the bifurcation. Minimal calcified plaque at the ICA bulb but no stenosis. Carotid artery tortuous. Left carotid system: Common carotid artery widely patent to the bifurcation. No soft or calcified  plaque at the carotid bifurcation or ICA bulb. No stenosis. The vessel is tortuous. Vertebral arteries: Both vertebral artery origins are widely patent. Both vertebral arteries appear normal through the cervical region to the foramen magnum. Skeleton: Ordinary cervical spondylosis. Other neck: No mass or lymphadenopathy. Upper chest: Normal Review of the MIP images confirms the above findings CTA HEAD FINDINGS Anterior circulation: Both internal carotid arteries are patent through the skull base and siphon regions. Ordinary mild siphon calcification but no stenosis greater than 30%. The anterior and middle cerebral vessels are patent without proximal stenosis, aneurysm or vascular malformation. No large or medium vessel occlusion is identified. Posterior circulation: Both vertebral arteries are patent to the basilar. Mild atherosclerotic change of the V4 segments. The basilar artery is diminutive, due to the fact that both posterior cerebral arteries receive most of there supply from the anterior circulation. Superior cerebellar and posterior cerebral vessels appear normal. Venous sinuses: Patent and normal. Anatomic variants: None significant otherwise. Incidental proximal basilar fenestration, not significant. Review of the MIP images confirms the above findings IMPRESSION: No sign of acute large or medium vessel occlusion. Tortuous vessels as might be seen with a history of hypertension. Fairly minimal atherosclerotic disease for a person of this age. No carotid bifurcation stenosis. Ordinary age related atherosclerosis of the carotid siphons without flow limiting stenosis. Posterior circulation is diminutive, due to the fact that the posterior cerebral arteries receive most of there supply from the anterior circulation. Electronically Signed   By: Paulina Fusi M.D.   On: 08/14/2019 01:05   CT Head Wo Contrast  Result Date: 08/13/2019 CLINICAL DATA:  Episode of left arm weakness and discoordination. Symptoms  presently resolved. EXAM: CT HEAD WITHOUT CONTRAST TECHNIQUE: Contiguous axial images were obtained from the base of the skull through the vertex without intravenous contrast. COMPARISON:  None. FINDINGS: Brain: Age related volume loss. No focal finding affects the brainstem or cerebellum. Cerebral hemispheres show moderate chronic appearing small vessel changes of the white matter. No sign of cortical infarction. No mass lesion, hemorrhage, hydrocephalus or extra-axial collection. Vascular: There is atherosclerotic calcification of the major vessels at the base of the brain. Skull: Negative Sinuses/Orbits: Clear/normal Other: None IMPRESSION: No acute finding by CT. Age related volume loss. Moderate chronic appearing small vessel changes of the cerebral hemispheric white. Electronically Signed   By: Paulina Fusi M.D.   On: 08/13/2019 22:55   CT Angio Neck W and/or Wo Contrast  Result Date: 08/14/2019 CLINICAL  DATA:  TIA.  Episodes of left arm weakness. EXAM: CT ANGIOGRAPHY HEAD AND NECK TECHNIQUE: Multidetector CT imaging of the head and neck was performed using the standard protocol during bolus administration of intravenous contrast. Multiplanar CT image reconstructions and MIPs were obtained to evaluate the vascular anatomy. Carotid stenosis measurements (when applicable) are obtained utilizing NASCET criteria, using the distal internal carotid diameter as the denominator. CONTRAST:  OMNIPAQUE IOHEXOL 350 MG/ML SOLN COMPARISON:  Head CT earlier same day FINDINGS: CTA NECK FINDINGS Aortic arch: Minimal aortic atherosclerosis. No aneurysm or dissection. Branching pattern is normal without origin stenosis. Right carotid system: Common carotid artery widely patent to the bifurcation. Minimal calcified plaque at the ICA bulb but no stenosis. Carotid artery tortuous. Left carotid system: Common carotid artery widely patent to the bifurcation. No soft or calcified plaque at the carotid bifurcation or ICA bulb.  No stenosis. The vessel is tortuous. Vertebral arteries: Both vertebral artery origins are widely patent. Both vertebral arteries appear normal through the cervical region to the foramen magnum. Skeleton: Ordinary cervical spondylosis. Other neck: No mass or lymphadenopathy. Upper chest: Normal Review of the MIP images confirms the above findings CTA HEAD FINDINGS Anterior circulation: Both internal carotid arteries are patent through the skull base and siphon regions. Ordinary mild siphon calcification but no stenosis greater than 30%. The anterior and middle cerebral vessels are patent without proximal stenosis, aneurysm or vascular malformation. No large or medium vessel occlusion is identified. Posterior circulation: Both vertebral arteries are patent to the basilar. Mild atherosclerotic change of the V4 segments. The basilar artery is diminutive, due to the fact that both posterior cerebral arteries receive most of there supply from the anterior circulation. Superior cerebellar and posterior cerebral vessels appear normal. Venous sinuses: Patent and normal. Anatomic variants: None significant otherwise. Incidental proximal basilar fenestration, not significant. Review of the MIP images confirms the above findings IMPRESSION: No sign of acute large or medium vessel occlusion. Tortuous vessels as might be seen with a history of hypertension. Fairly minimal atherosclerotic disease for a person of this age. No carotid bifurcation stenosis. Ordinary age related atherosclerosis of the carotid siphons without flow limiting stenosis. Posterior circulation is diminutive, due to the fact that the posterior cerebral arteries receive most of there supply from the anterior circulation. Electronically Signed   By: Paulina Fusi M.D.   On: 08/14/2019 01:05    Pertinent labs & imaging results that were available during my care of the patient were reviewed by me and considered in my medical decision making (see chart for  details).  Medications Ordered in ED Medications  aspirin chewable tablet 324 mg (324 mg Oral Given 08/14/19 0006)  clopidogrel (PLAVIX) tablet 300 mg (300 mg Oral Given 08/14/19 0006)  iohexol (OMNIPAQUE) 350 MG/ML injection 100 mL (100 mLs Intravenous Contrast Given 08/14/19 0056)  Procedures Procedures  (including critical care time)  Medical Decision Making / ED Course I have reviewed the nursing notes for this encounter and the patient's prior records (if available in EHR or on provided paperwork).   Cynthia Mata was evaluated in Emergency Department on 08/14/2019 for the symptoms described in the history of present illness. She was evaluated in the context of the global COVID-19 pandemic, which necessitated consideration that the patient might be at risk for infection with the SARS-CoV-2 virus that causes COVID-19. Institutional protocols and algorithms that pertain to the evaluation of patients at risk for COVID-19 are in a state of rapid change based on information released by regulatory bodies including the CDC and federal and state organizations. These policies and algorithms were followed during the patient's care in the ED.  Left upper extremity weakness.  Now resolved.  High risk for CVA.  Likely TIA.  Screening labs notable for mild hypokalemia (possible hypokalemic periodic paralysis though less likely).  Patient has hyperglycemia without evidence of DKA.  Otherwise labs close to patient's baseline and reassuring.  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  Given patient's risk, will require admission for TIA work-up.  Case discussed with Dr. Amada JupiterKirkpatrick who recommended loading the patient with aspirin and Plavix and obtaining a CTA of the head and neck.  We will attempt to obtain these [facility].  We  will consult hospitalist for admission      Final Clinical Impression(s) / ED Diagnoses Final diagnoses:  Left arm weakness      This chart was dictated using voice recognition software.  Despite best efforts to proofread,  errors can occur which can change the documentation meaning.     Nira Connardama, Chidiebere Wynn Eduardo, MD 08/14/19 0300

## 2019-08-13 NOTE — ED Notes (Signed)
ED Provider made aware of pt status 

## 2019-08-14 ENCOUNTER — Observation Stay (HOSPITAL_BASED_OUTPATIENT_CLINIC_OR_DEPARTMENT_OTHER): Payer: Medicaid Other

## 2019-08-14 ENCOUNTER — Observation Stay (HOSPITAL_COMMUNITY): Payer: Medicaid Other

## 2019-08-14 DIAGNOSIS — R29898 Other symptoms and signs involving the musculoskeletal system: Secondary | ICD-10-CM | POA: Diagnosis not present

## 2019-08-14 DIAGNOSIS — M6281 Muscle weakness (generalized): Secondary | ICD-10-CM | POA: Diagnosis not present

## 2019-08-14 DIAGNOSIS — R9431 Abnormal electrocardiogram [ECG] [EKG]: Secondary | ICD-10-CM

## 2019-08-14 LAB — SARS CORONAVIRUS 2 (TAT 6-24 HRS): SARS Coronavirus 2: NEGATIVE

## 2019-08-14 LAB — ECHOCARDIOGRAM COMPLETE
Height: 60 in
Weight: 2880 oz

## 2019-08-14 LAB — LIPID PANEL
Cholesterol: 167 mg/dL (ref 0–200)
HDL: 45 mg/dL (ref >40)
LDL Cholesterol: 104 mg/dL — ABNORMAL HIGH (ref 0–99)
Total CHOL/HDL Ratio: 3.7 RATIO
Triglycerides: 89 mg/dL (ref <150)
VLDL: 18 mg/dL (ref 0–40)

## 2019-08-14 LAB — HEMOGLOBIN A1C
Hgb A1c MFr Bld: 8.5 % — ABNORMAL HIGH (ref 4.8–5.6)
Mean Plasma Glucose: 197.25 mg/dL

## 2019-08-14 LAB — GLUCOSE, CAPILLARY
Glucose-Capillary: 160 mg/dL — ABNORMAL HIGH (ref 70–99)
Glucose-Capillary: 134 mg/dL — ABNORMAL HIGH (ref 70–99)
Glucose-Capillary: 210 mg/dL — ABNORMAL HIGH (ref 70–99)
Glucose-Capillary: 202 mg/dL — ABNORMAL HIGH (ref 70–99)
Glucose-Capillary: 298 mg/dL — ABNORMAL HIGH (ref 70–99)

## 2019-08-14 MED ORDER — ASPIRIN 325 MG PO TABS
325.0000 mg | ORAL_TABLET | Freq: Every day | ORAL | Status: DC
  Administered 2019-08-14 – 2019-08-15 (×2): 325 mg via ORAL
  Filled 2019-08-14 (×2): qty 1

## 2019-08-14 MED ORDER — CLOPIDOGREL BISULFATE 75 MG PO TABS
75.0000 mg | ORAL_TABLET | Freq: Every day | ORAL | Status: DC
  Administered 2019-08-14 – 2019-08-15 (×2): 75 mg via ORAL
  Filled 2019-08-14 (×2): qty 1

## 2019-08-14 MED ORDER — ACETAMINOPHEN 325 MG PO TABS: 650.0000 mg | ORAL_TABLET | ORAL | Status: DC | PRN

## 2019-08-14 MED ORDER — ATORVASTATIN CALCIUM 10 MG PO TABS
20.0000 mg | ORAL_TABLET | Freq: Every day | ORAL | Status: DC
  Administered 2019-08-14: 20 mg via ORAL
  Filled 2019-08-14: qty 2

## 2019-08-14 MED ORDER — ASPIRIN 300 MG RE SUPP: 300.0000 mg | Freq: Every day | RECTAL | Status: DC

## 2019-08-14 MED ORDER — ACETAMINOPHEN 650 MG RE SUPP: 650.0000 mg | RECTAL | Status: DC | PRN

## 2019-08-14 MED ORDER — STROKE: EARLY STAGES OF RECOVERY BOOK
Freq: Once | Status: AC
  Filled 2019-08-14: qty 1

## 2019-08-14 MED ORDER — POTASSIUM CHLORIDE CRYS ER 20 MEQ PO TBCR
40.0000 meq | EXTENDED_RELEASE_TABLET | Freq: Once | ORAL | Status: AC
  Administered 2019-08-14: 09:00:00 40 meq via ORAL
  Filled 2019-08-14: qty 2

## 2019-08-14 MED ORDER — INSULIN ASPART 100 UNIT/ML ~~LOC~~ SOLN
5.0000 [IU] | Freq: Once | SUBCUTANEOUS | Status: AC
  Administered 2019-08-14: 5 [IU] via SUBCUTANEOUS

## 2019-08-14 MED ORDER — INSULIN ASPART 100 UNIT/ML ~~LOC~~ SOLN
0.0000 [IU] | SUBCUTANEOUS | Status: DC
  Administered 2019-08-14: 3 [IU] via SUBCUTANEOUS
  Administered 2019-08-14: 2 [IU] via SUBCUTANEOUS
  Administered 2019-08-14: 3 [IU] via SUBCUTANEOUS
  Administered 2019-08-14: 1 [IU] via SUBCUTANEOUS

## 2019-08-14 MED ORDER — ACETAMINOPHEN 160 MG/5ML PO SOLN: 650.0000 mg | ORAL | Status: DC | PRN

## 2019-08-14 MED ORDER — INSULIN ASPART 100 UNIT/ML ~~LOC~~ SOLN
0.0000 [IU] | Freq: Three times a day (TID) | SUBCUTANEOUS | Status: DC
  Administered 2019-08-15: 3 [IU] via SUBCUTANEOUS
  Administered 2019-08-15: 2 [IU] via SUBCUTANEOUS

## 2019-08-14 NOTE — Progress Notes (Signed)
Occupational Therapy Evaluation Patient Details Name: Cynthia Mata MRN: [id number] DOB: 27-Jul-1945 Today's Date: 08/14/2019    History of Present Illness 74 y.o. female with history of diabetes, hyperlipidemia, hypertension  with a history of diabetes, hyperlipidemia, hypertension who presents with recurrent episodes of left arm weakness that has happened twice now.  MRI -. 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   Clinical Impression   PTA, pt lived at home with her daughter and was modified independent. Daughter assists with medication management and IADL tasks due to pt being legally blind. Pt has had 2 falls in last 6 months. Feel pt would benefit form HHOT/PT to increase independence within home given vision deficits and reduce risk of falls. Pt/daughter in agreement. Signs/symptoms of CVA discussed using [employer].     Follow Up Recommendations  Home health OT;Supervision - Intermittent    Equipment Recommendations  Tub/shower seat    Recommendations for Other Services       Precautions / Restrictions Precautions Precautions: Fall      Mobility Bed Mobility Overal bed mobility: Independent                Transfers Overall transfer level: Modified independent                    Balance Overall balance assessment: Mild deficits observed, not formally tested;History of Falls                                         ADL either performed or assessed with clinical judgement   ADL Overall ADL's : At baseline                                       General ADL Comments: Educated pt. daughter on strateiges to reduce risk of falls, including low vision compensatory strategies. Educated on lighting, contrast and reducing clutter. (Overall set up due to unfamiliar environment)     Vision Baseline Vision/History: Legally blind;Glaucoma;Macular Degeneration Additional Comments: legally blind      Perception     Praxis      Pertinent Vitals/Pain Pain Assessment: No/denies pain     Hand Dominance Right   Extremity/Trunk Assessment Upper Extremity Assessment Upper Extremity Assessment: Overall WFL for tasks assessed   Lower Extremity Assessment Lower Extremity Assessment: Overall WFL for tasks assessed   Cervical / Trunk Assessment Cervical / Trunk Assessment: Normal   Communication Communication Communication: Prefers language other than English(Spainish)   Cognition Arousal/Alertness: Awake/alert Behavior During Therapy: WFL for tasks assessed/performed Overall Cognitive Status: Within Functional Limits for tasks assessed                                     General Comments       Exercises     Shoulder Instructions      Home Living Family/patient expects to be discharged to:: Private residence Living Arrangements: Children Available Help at Discharge: Family;Available 24 hours/day Type of Home: House Home Access: Level entry     Home Layout: One level     Bathroom Shower/Tub: Tub/shower unit;Walk-in shower   Bathroom Toilet: Standard Bathroom Accessibility: Yes How Accessible: Accessible via walker Home Equipment: Walker - 2  wheels;Cane - single point          Prior Functioning/Environment Level of Independence: Independent        Comments: fear of falling        OT Problem List: Impaired balance (sitting and/or standing);Impaired vision/perception;Decreased knowledge of use of DME or AE      OT Treatment/Interventions:      OT Goals(Current goals can be found in the care plan section) Acute Rehab OT Goals Patient Stated Goal: to be independent and not fall OT Goal Formulation: All assessment and education complete, DC therapy  OT Frequency:     Barriers to D/C:            Co-evaluation              AM-PAC OT "6 Clicks" Daily Activity     Outcome Measure Help from another person eating meals?:  None Help from another person taking care of personal grooming?: None Help from another person toileting, which includes using toliet, bedpan, or urinal?: A Little Help from another person bathing (including washing, rinsing, drying)?: A Little Help from another person to put on and taking off regular upper body clothing?: A Little Help from another person to put on and taking off regular lower body clothing?: A Little 6 Click Score: 20   End of Session Nurse Communication: Other (comment)(DC needs)  Activity Tolerance: Patient tolerated treatment well Patient left: in bed;with call bell/phone within reach;with family/visitor present  OT Visit Diagnosis: Unsteadiness on feet (R26.81);History of falling (Z91.81);Low vision, both eyes (H54.2)                Time: 1308-6578 OT Time Calculation (min): 33 min Charges:  OT General Charges $OT Visit: 1 Visit OT Evaluation $OT Eval Low Complexity: 1 Low OT Treatments $Self Care/Home Management : 8-22 mins  Maurie Boettcher, OT/L   Acute OT Clinical Specialist [place] Pager [phone] Office [phone]   [facility] 08/14/2019, 4:33 PM

## 2019-08-14 NOTE — Consult Note (Signed)
Neurology Consultation Reason for Consult: Left arm weakness Referring Physician: Cardama, P  CC: Left arm weakness  History is obtained from: Patient  HPI: Cynthia Mata is a 74 y.o. female with a history of diabetes, hyperlipidemia, hypertension who presents with recurrent episodes of left arm weakness that has happened twice now.  The first time happened around 9 PM 2 nights ago, the most recent 5 PM last night.  They both lasted for approximately 30 minutes.  She denies visual change or numbness.  She does state there may been some slight sensory change on her face, but no weakness or other involvement of her face or arm.  Due to this she presented to med [facility] where CT/CTA were performed which were essentially negative.   LKW: 3/6 5 PM tpa given?: no, resolution of symptoms   ROS: A 14 point ROS was performed and is negative except as noted in the HPI.   Past Medical History:  Diagnosis Date  . Anemia   . Breast cyst   . Cataract   . Diabetes mellitus without complication (HCC)   . Hyperlipidemia   . Hypertension   . Macular degeneration   . Osteoporosis   . Thyroid disease   . Trapezius strain 10/23/2014     Family History  Problem Relation Age of Onset  . Hypertension Maternal Aunt   . Heart disease Maternal Aunt   . CAD Maternal Grandfather   . Diabetes Neg Hx   . COPD Neg Hx      Social History:  reports that she has never smoked. She has never used smokeless tobacco. She reports that she does not drink alcohol or use drugs.   Exam: Current vital signs: BP (!) 159/73 (BP Location: Right Arm)   Pulse 80   Temp 98.3 F (36.8 C) (Oral)   Resp 18   Ht 5' (1.524 m)   Wt 81.6 kg   SpO2 100%   BMI 35.15 kg/m  Vital signs in last 24 hours: Temp:  [98 F (36.7 C)-98.3 F (36.8 C)] 98.3 F (36.8 C) (03/07 0321) Pulse Rate:  [74-81] 80 (03/07 0321) Resp:  [14-20] 18 (03/07 0321) BP: (123-161)/(59-73) 159/73 (03/07 0321) SpO2:  [95  %-100 %] 100 % (03/07 0321) Weight:  [81.6 kg] 81.6 kg (03/06 2139)   Physical Exam  Constitutional: Appears well-developed and well-nourished.  Psych: Affect appropriate to situation Eyes: No scleral injection HENT: No OP obstrucion MSK: no joint deformities.  Cardiovascular: Normal rate and regular rhythm.  Respiratory: Effort normal, non-labored breathing GI: Soft.  No distension. There is no tenderness.  Skin: WDI  Neuro: Mental Status: Patient is awake, alert, oriented to person, place, month, year, and situation. Patient is able to give a clear and coherent history. No signs of aphasia or neglect Cranial Nerves: II: Visual Fields are full. Pupils are equal, round, and reactive to light.   III,IV, VI: EOMI without ptosis or diploplia.  V: Facial sensation is symmetric to temperature VII: Facial movement is symmetric.  VIII: hearing is intact to voice X: Uvula elevates symmetrically XI: Shoulder shrug is symmetric. XII: tongue is midline without atrophy or fasciculations.  Motor: Tone is normal. Bulk is normal. 5/5 strength was present in all four extremities.  Sensory: Sensation is symmetric to light touch and temperature in the arms and legs. Deep Tendon Reflexes: 2+ and symmetric in the biceps and patellae.  Plantars: Toes are downgoing bilaterally.  Cerebellar: FNF and HKS are intact bilaterally  I have reviewed labs in epic and the results pertinent to this consultation are: Glucose 211  I have reviewed the images obtained: CT/CTA-no acute findings  Impression: 74 year old female with recurrent episodes of left arm weakness most consistent with TIA.  Given the multiple episodes, she has been loaded with aspirin Plavix will need admission for secondary risk factor modification.  Recommendations: - HgbA1c, fasting lipid panel - MRI  of the brain without contrast - Frequent neuro checks - Echocardiogram - Prophylactic therapy-Antiplatelet med: Aspirin -81  mg and Plavix 75 mg daily - Risk factor modification - Telemetry monitoring - PT consult, OT consult, Speech consult - Stroke team to follow    Roland Rack, MD [facility] [phone]  If 7pm- 7am, please page neurology on call as listed in [place].

## 2019-08-14 NOTE — H&P (Signed)
History and Physical    [name] [place] FWY:[id number] DOB: 08/26/1945 DOA: 08/13/2019  PCP: Eather Colas, FNP  Patient coming from: Home  Chief Complaint: left arm weakness  HPI: Cynthia Mata de Cynthia Mata is a 74 y.o. female with medical history significant of diabetes, hypertension comes in because of left arm weakness that occurred for about 30 minutes and then resolved.  She has had no recent illnesses.  No nausea vomiting diarrhea.  No chest pain or shortness of breath.  She denies any facial drooping or weakness while this happened.  Patient is being referred for admission for TIA work-up.  Review of Systems: As per HPI otherwise 10 point review of systems negative.   Past Medical History:  Diagnosis Date  . Anemia   . Breast cyst   . Cataract   . Diabetes mellitus without complication (HCC)   . Hyperlipidemia   . Hypertension   . Macular degeneration   . Osteoporosis   . Thyroid disease   . Trapezius strain 10/23/2014    Past Surgical History:  Procedure Laterality Date  . ANKLE FRACTURE SURGERY     Right ankle  . CATARACT EXTRACTION    . cataracts  05-2015  . TONSILLECTOMY     Abcess removed also     reports that she has never smoked. She has never used smokeless tobacco. She reports that she does not drink alcohol or use drugs.  No Known Allergies  Family History  Problem Relation Age of Onset  . Hypertension Maternal Aunt   . Heart disease Maternal Aunt   . CAD Maternal Grandfather   . Diabetes Neg Hx   . COPD Neg Hx     Prior to Admission medications   Medication Sig Start Date End Date Taking? Authorizing Provider  alendronate (FOSAMAX) 10 MG tablet TAKE 1 TABLET BY MOUTH DAILY BEFORE BREAKFAST. TAKE WITH A FULL GLASS OF WATER ON AN EMPTY STOMACH. 10/21/17   Saguier, Ramon Dredge, PA-C  atorvastatin (LIPITOR) 20 MG tablet Take 1 tablet (20 mg total) by mouth daily. 12/30/16   Saguier, Ramon Dredge, PA-C  brimonidine [facility]) 0.2 % ophthalmic solution   07/24/17   [provider]  dorzolamide-timolol (COSOPT) 22.3-6.8 MG/ML ophthalmic solution  07/24/17   [provider]  glucose blood (ONETOUCH VERIO) test strip Use to check blood sugars 3 times daily. Dx Code E11.9 02/16/17   Reather Littler, MD  glucose blood [facility] VERIO) test strip Use as instructed 03/15/18   Reather Littler, MD  insulin aspart (NOVOLOG FLEXPEN) 100 UNIT/ML FlexPen Inject 6 units with breakfast and lunch and 8 units with supper 03/15/18   Reather Littler, MD  Insulin Glargine (TOUJEO MAX SOLOSTAR) 300 UNIT/ML SOPN Inject 34 Units into the skin daily. 04/08/18   Shamleffer, Konrad Dolores, MD  metFORMIN (GLUCOPHAGE-XR) 500 MG 24 hr tablet Take 1 tablet (500 mg total) by mouth 3 (three) times daily. 04/08/18   Shamleffer, Konrad Dolores, MD  NIFEdipine (NIFEDIAC CC) 60 MG 24 hr tablet Take 1 tablet by mouth once daily. (patient must be seen in office for additional refills.) 10/04/18   Reather Littler, MD  olmesartan-hydrochlorothiazide (BENICAR HCT) 40-25 MG tablet TAKE 1 TABLET BY MOUTH DAILY. 07/23/18   Saguier, Ramon Dredge, PA-C  olmesartan-hydrochlorothiazide (BENICAR HCT) 40-25 MG tablet Take 1 tablet by mouth daily. 07/22/18   Reather Littler, MD  ONE TOUCH ULTRA TEST test strip Use to check blood sugar 3 times per day dx code E11.9 05/28/18   Reather Littler, MD  TECHLITE PEN NEEDLES 32G X 6 MM MISC USE TO INJECT INSULIN FOUR TIMES DAILY 05/03/18   Elayne Snare, MD    Physical Exam: Vitals:   08/14/19 0015 08/14/19 0200 08/14/19 0227 08/14/19 0321  BP: (!) 145/59 123/62  (!) 159/73  Pulse: 74 74  80  Resp: 20 14  18   Temp:   98 F (36.7 C) 98.3 F (36.8 C)  TempSrc:   Oral Oral  SpO2: 98% 95%  100%  Weight:      Height:        Constitutional: NAD, calm, comfortable Vitals:   08/14/19 0015 08/14/19 0200 08/14/19 0227 08/14/19 0321  BP: (!) 145/59 123/62  (!) 159/73  Pulse: 74 74  80  Resp: 20 14  18   Temp:   98 F (36.7 C) 98.3 F (36.8 C)  TempSrc:   Oral Oral    SpO2: 98% 95%  100%  Weight:      Height:       Eyes: PERRL, lids and conjunctivae normal ENMT: Mucous membranes are moist. Posterior pharynx clear of any exudate or lesions.Normal dentition.  Neck: normal, supple, no masses, no thyromegaly Respiratory: clear to auscultation bilaterally, no wheezing, no crackles. Normal respiratory effort. No accessory muscle use.  Cardiovascular: Regular rate and rhythm, no murmurs / rubs / gallops. No extremity edema. 2+ pedal pulses. No carotid bruits.  Abdomen: no tenderness, no masses palpated. No hepatosplenomegaly. Bowel sounds positive.  Musculoskeletal: no clubbing / cyanosis. No joint deformity upper and lower extremities. Good ROM, no contractures. Normal muscle tone.  Skin: no rashes, lesions, ulcers. No induration Neurologic: CN 2-12 grossly intact. Sensation intact, DTR normal. Strength 5/5 in all 4.  Psychiatric: Normal judgment and insight. Alert and oriented x 3. Normal mood.    Labs on Admission: I have personally reviewed following labs and imaging studies  CBC: Recent Labs  Lab 08/13/19 2236  WBC 7.9  NEUTROABS 4.9  HGB 11.4*  HCT 35.7*  MCV 92.2  PLT 616   Basic Metabolic Panel: Recent Labs  Lab 08/13/19 2236  NA 136  K 3.3*  CL 97*  CO2 27  GLUCOSE 211*  BUN 24*  CREATININE 1.06*  CALCIUM 9.3   GFR: Estimated Creatinine Clearance: 45.4 mL/min (A) (by C-G formula based on SCr of 1.06 mg/dL (H)). Liver Function Tests: No results for input(s): AST, ALT, ALKPHOS, BILITOT, PROT, ALBUMIN in the last 168 hours. No results for input(s): LIPASE, AMYLASE in the last 168 hours. No results for input(s): AMMONIA in the last 168 hours. Coagulation Profile: Recent Labs  Lab 08/13/19 2236  INR 0.9   Cardiac Enzymes: No results for input(s): CKTOTAL, CKMB, CKMBINDEX, TROPONINI in the last 168 hours. BNP (last 3 results) No results for input(s): PROBNP in the last 8760 hours. HbA1C: No results for input(s): HGBA1C in  the last 72 hours. CBG: No results for input(s): GLUCAP in the last 168 hours. Lipid Profile: No results for input(s): CHOL, HDL, LDLCALC, TRIG, CHOLHDL, LDLDIRECT in the last 72 hours. Thyroid Function Tests: No results for input(s): TSH, T4TOTAL, FREET4, T3FREE, THYROIDAB in the last 72 hours. Anemia Panel: No results for input(s): VITAMINB12, FOLATE, FERRITIN, TIBC, IRON, RETICCTPCT in the last 72 hours. Urine analysis:    Component Value Date/Time   COLORURINE YELLOW 08/13/2019 2230   APPEARANCEUR CLEAR 08/13/2019 2230   LABSPEC 1.010 08/13/2019 2230   PHURINE 6.0 08/13/2019 2230   GLUCOSEU NEGATIVE 08/13/2019 2230   HGBUR NEGATIVE 08/13/2019 2230   BILIRUBINUR  NEGATIVE 08/13/2019 2230   KETONESUR NEGATIVE 08/13/2019 2230   PROTEINUR NEGATIVE 08/13/2019 2230   NITRITE NEGATIVE 08/13/2019 2230   LEUKOCYTESUR SMALL (A) 08/13/2019 2230   Sepsis Labs: !!!!!!!!!!!!!!!!!!!!!!!!!!!!!!!!!!!!!!!!!!!! @LABRCNTIP (procalcitonin:4,lacticidven:4) )No results found for this or any previous visit (from the past 240 hour(s)).   Radiological Exams on Admission: CT Angio Head W or Wo Contrast  Result Date: 08/14/2019 CLINICAL DATA:  TIA.  Episodes of left arm weakness. EXAM: CT ANGIOGRAPHY HEAD AND NECK TECHNIQUE: Multidetector CT imaging of the head and neck was performed using the standard protocol during bolus administration of intravenous contrast. Multiplanar CT image reconstructions and MIPs were obtained to evaluate the vascular anatomy. Carotid stenosis measurements (when applicable) are obtained utilizing NASCET criteria, using the distal internal carotid diameter as the denominator. CONTRAST:  10/14/2019 OMNIPAQUE IOHEXOL 350 MG/ML SOLN COMPARISON:  Head CT earlier same day FINDINGS: CTA NECK FINDINGS Aortic arch: Minimal aortic atherosclerosis. No aneurysm or dissection. Branching pattern is normal without origin stenosis. Right carotid system: Common carotid artery widely patent to the  bifurcation. Minimal calcified plaque at the ICA bulb but no stenosis. Carotid artery tortuous. Left carotid system: Common carotid artery widely patent to the bifurcation. No soft or calcified plaque at the carotid bifurcation or ICA bulb. No stenosis. The vessel is tortuous. Vertebral arteries: Both vertebral artery origins are widely patent. Both vertebral arteries appear normal through the cervical region to the foramen magnum. Skeleton: Ordinary cervical spondylosis. Other neck: No mass or lymphadenopathy. Upper chest: Normal Review of the MIP images confirms the above findings CTA HEAD FINDINGS Anterior circulation: Both internal carotid arteries are patent through the skull base and siphon regions. Ordinary mild siphon calcification but no stenosis greater than 30%. The anterior and middle cerebral vessels are patent without proximal stenosis, aneurysm or vascular malformation. No large or medium vessel occlusion is identified. Posterior circulation: Both vertebral arteries are patent to the basilar. Mild atherosclerotic change of the V4 segments. The basilar artery is diminutive, due to the fact that both posterior cerebral arteries receive most of there supply from the anterior circulation. Superior cerebellar and posterior cerebral vessels appear normal. Venous sinuses: Patent and normal. Anatomic variants: None significant otherwise. Incidental proximal basilar fenestration, not significant. Review of the MIP images confirms the above findings IMPRESSION: No sign of acute large or medium vessel occlusion. Tortuous vessels as might be seen with a history of hypertension. Fairly minimal atherosclerotic disease for a person of this age. No carotid bifurcation stenosis. Ordinary age related atherosclerosis of the carotid siphons without flow limiting stenosis. Posterior circulation is diminutive, due to the fact that the posterior cerebral arteries receive most of there supply from the anterior circulation.  Electronically Signed   By: M.D.   On: 08/14/2019 01:05   CT Head Wo Contrast  Result Date: 08/13/2019 CLINICAL DATA:  Episode of left arm weakness and discoordination. Symptoms presently resolved. EXAM: CT HEAD WITHOUT CONTRAST TECHNIQUE: Contiguous axial images were obtained from the base of the skull through the vertex without intravenous contrast. COMPARISON:  None. FINDINGS: Brain: Age related volume loss. No focal finding affects the brainstem or cerebellum. Cerebral hemispheres show moderate chronic appearing small vessel changes of the white matter. No sign of cortical infarction. No mass lesion, hemorrhage, hydrocephalus or extra-axial collection. Vascular: There is atherosclerotic calcification of the major vessels at the base of the brain. Skull: Negative Sinuses/Orbits: Clear/normal Other: None IMPRESSION: No acute finding by CT. Age related volume loss. Moderate chronic appearing small vessel  changes of the cerebral hemispheric white. Electronically Signed   By: Paulina Fusi M.D.   On: 08/13/2019 22:55   CT Angio Neck W and/or Wo Contrast  Result Date: 08/14/2019 CLINICAL DATA:  TIA.  Episodes of left arm weakness. EXAM: CT ANGIOGRAPHY HEAD AND NECK TECHNIQUE: Multidetector CT imaging of the head and neck was performed using the standard protocol during bolus administration of intravenous contrast. Multiplanar CT image reconstructions and MIPs were obtained to evaluate the vascular anatomy. Carotid stenosis measurements (when applicable) are obtained utilizing NASCET criteria, using the distal internal carotid diameter as the denominator. CONTRAST:  OMNIPAQUE IOHEXOL 350 MG/ML SOLN COMPARISON:  Head CT earlier same day FINDINGS: CTA NECK FINDINGS Aortic arch: Minimal aortic atherosclerosis. No aneurysm or dissection. Branching pattern is normal without origin stenosis. Right carotid system: Common carotid artery widely patent to the bifurcation. Minimal calcified plaque at the  ICA bulb but no stenosis. Carotid artery tortuous. Left carotid system: Common carotid artery widely patent to the bifurcation. No soft or calcified plaque at the carotid bifurcation or ICA bulb. No stenosis. The vessel is tortuous. Vertebral arteries: Both vertebral artery origins are widely patent. Both vertebral arteries appear normal through the cervical region to the foramen magnum. Skeleton: Ordinary cervical spondylosis. Other neck: No mass or lymphadenopathy. Upper chest: Normal Review of the MIP images confirms the above findings CTA HEAD FINDINGS Anterior circulation: Both internal carotid arteries are patent through the skull base and siphon regions. Ordinary mild siphon calcification but no stenosis greater than 30%. The anterior and middle cerebral vessels are patent without proximal stenosis, aneurysm or vascular malformation. No large or medium vessel occlusion is identified. Posterior circulation: Both vertebral arteries are patent to the basilar. Mild atherosclerotic change of the V4 segments. The basilar artery is diminutive, due to the fact that both posterior cerebral arteries receive most of there supply from the anterior circulation. Superior cerebellar and posterior cerebral vessels appear normal. Venous sinuses: Patent and normal. Anatomic variants: None significant otherwise. Incidental proximal basilar fenestration, not significant. Review of the MIP images confirms the above findings IMPRESSION: No sign of acute large or medium vessel occlusion. Tortuous vessels as might be seen with a history of hypertension. Fairly minimal atherosclerotic disease for a person of this age. No carotid bifurcation stenosis. Ordinary age related atherosclerosis of the carotid siphons without flow limiting stenosis. Posterior circulation is diminutive, due to the fact that the posterior cerebral arteries receive most of there supply from the anterior circulation. Electronically Signed   By: Paulina Fusi M.D.    On: 08/14/2019 01:05    EKG: Independently reviewed. nsr lbbb c/w old same Old chart reviewed   Assessment/Plan 74 year old female with TIA  Principal Problem:   Weakness of left upper extremity-patient neuro exam is normal at this time.  Obtain MRI brain.  Obtain cardiac echo.  Neurology consulted will be seeing patient.  Continue aspirin and Plavix.  Continue statin.  Check hemoglobin A1c and fasting lipid panel in the morning.  Obtain frequent neurological checks.  Monitor on telemetry monitoring.  Active Problems:    Essential hypertension, benign-holding blood pressures at this time and allowing permissive hypertension for overnight    Diabetes mellitus type 2, controlled (HCC)-holding diabetic medications and placed on sliding scale insulin    DVT prophylaxis: scds Code Status:  full Family Communication: none Disposition Plan:  1-2 days Consults called:  neurology Admission status:  observation   Janiel Crisostomo A MD [facility]  If 7PM-7AM, please contact  night-coverage [email or web address] Password [email or web address]  08/14/2019, 4:01 AM

## 2019-08-14 NOTE — Plan of Care (Signed)

## 2019-08-14 NOTE — Progress Notes (Signed)
  Echocardiogram 2D Echocardiogram has been performed.  Cynthia Mata 08/14/2019, 2:14 PM

## 2019-08-14 NOTE — Progress Notes (Signed)
STROKE TEAM PROGRESS NOTE   HISTORY OF PRESENT ILLNESS (per record) Cynthia Mata is a 74 y.o. female with a history of diabetes, hyperlipidemia, hypertension who presents with recurrent episodes of left arm weakness that has happened twice now.  The first time happened around 9 PM 2 nights ago, the most recent 5 PM last night.  They both lasted for approximately 30 minutes.  She denies visual change or numbness.  She does state there may been some slight sensory change on her face, but no weakness or other involvement of her face or arm.  Due to this she presented to [facility] where CT/CTA were performed which were essentially negative. LKW: 3/6 5 PM tpa given?: no, resolution of symptoms   INTERVAL HISTORY  I have personally reviewed history of presenting illness with the patient and her daughter as well as electronic medical records and imaging films in PACS.  She presented with 2 transient episodes of left arm weakness and clumsiness has improved completely.  MRI scan of the brain is negative for acute stroke.  Echocardiogram is pending.  CT angiogram of the brain and neck does not show significant large vessel intracranial or extracranial stenosis or occlusion    OBJECTIVE Vitals:   08/14/19 0200 08/14/19 0227 08/14/19 0321 08/14/19 0514  BP: 123/62  (!) 159/73 123/61  Pulse: 74  80 73  Resp: 14  18   Temp:  98 F (36.7 C) 98.3 F (36.8 C) 97.9 F (36.6 C)  TempSrc:  Oral Oral Oral  SpO2: 95%  100% 99%  Weight:      Height:        CBC:  Recent Labs  Lab 08/13/19 2236  WBC 7.9  NEUTROABS 4.9  HGB 11.4*  HCT 35.7*  MCV 92.2  PLT 937    Basic Metabolic Panel:  Recent Labs  Lab 08/13/19 2236  NA 136  K 3.3*  CL 97*  CO2 27  GLUCOSE 211*  BUN 24*  CREATININE 1.06*  CALCIUM 9.3    Lipid Panel:     Component Value Date/Time   CHOL 194 10/08/2017 0904   TRIG 105.0 10/08/2017 0904   HDL 47.60 10/08/2017 0904   CHOLHDL 4 10/08/2017  0904   VLDL 21.0 10/08/2017 0904   LDLCALC 125 (H) 10/08/2017 0904   HgbA1c:  Lab Results  Component Value Date   HGBA1C 8.6 (H) 03/12/2018   Urine Drug Screen: No results found for: LABOPIA, COCAINSCRNUR, LABBENZ, AMPHETMU, THCU, LABBARB  Alcohol Level No results found for: ETH  IMAGING  CT Angio Head W or Wo Contrast CT Angio Neck W and/or Wo Contrast 08/14/2019 IMPRESSION:  No sign of acute large or medium vessel occlusion. Tortuous vessels as might be seen with a history of hypertension. Fairly minimal atherosclerotic disease for a person of this age. No carotid bifurcation stenosis. Ordinary age related atherosclerosis of the carotid siphons without flow limiting stenosis. Posterior circulation is diminutive, due to the fact that the posterior cerebral arteries receive most of there supply from the anterior circulation.   CT Head Wo Contrast 08/13/2019 IMPRESSION: No acute finding by CT. Age related volume loss. Moderate chronic appearing small vessel changes of the cerebral hemispheric white.   CT Angio Neck W and/or Wo Contrast 08/14/2019  IMPRESSION:  No sign of acute large or medium vessel occlusion. Tortuous vessels as might be seen with a history of hypertension. Fairly minimal atherosclerotic disease for a person of this age. No carotid bifurcation  stenosis. Ordinary age related atherosclerosis of the carotid siphons without flow limiting stenosis. Posterior circulation is diminutive, due to the fact that the posterior cerebral arteries receive most of there supply from the anterior circulation.    MRI Brain WO Contrast 07/17/2019 IMPRESSION: No acute infarction, hemorrhage, or mass. Moderate chronic microvascular ischemic changes.   Transthoracic Echocardiogram  2/7//2021 IMPRESSIONS  1. Normal LV systolic function; severe LVH; grade 1 diastolic  dysfunction; severe MAC.  2. Left ventricular ejection fraction, by estimation, is 60 to 65%. The  left ventricle has  normal function. The left ventricle has no regional  wall motion abnormalities. There is severe left ventricular hypertrophy.  Left ventricular diastolic parameters  are consistent with Grade I diastolic dysfunction (impaired relaxation).  Elevated left atrial pressure.  3. Right ventricular systolic function is normal. The right ventricular  size is normal. There is normal pulmonary artery systolic pressure.  4. The mitral valve is normal in structure and function. No evidence of  mitral valve regurgitation. No evidence of mitral stenosis.  5. The aortic valve is tricuspid. Aortic valve regurgitation is not  visualized. Mild to moderate aortic valve sclerosis/calcification is  present, without any evidence of aortic stenosis.  6. The inferior vena cava is normal in size with greater than 50%  respiratory variability, suggesting right atrial pressure of 3 mmHg.    ECG - SR rate 79 BPM. LBBB (See cardiology reading for complete details)   PHYSICAL EXAM Blood pressure 123/61, pulse 73, temperature 97.9 F (36.6 C), temperature source Oral, resp. rate 18, height 5' (1.524 m), weight 81.6 kg, SpO2 99 %. Obese elderly Hispanic lady not in distress. . Afebrile. Head is nontraumatic. Neck is supple without bruit.    Cardiac exam no murmur or gallop. Lungs are clear to auscultation. Distal pulses are well felt.  Neurological Exam ;  Awake  Alert oriented x 3. Normal speech and language.eye movements full without nystagmus.fundi were not visualized. 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  She is unable to read but can barely count fingers.  Visual fields impaired bilaterally due to poor acuity. Hearing is normal. Palatal movements are normal. Face symmetric. Tongue midline. Normal strength, tone, reflexes and coordination. Normal sensation. Gait deferred.   ASSESSMENT/PLAN Ms. Cynthia Mata is a 74 y.o. female with history of diabetes,  hyperlipidemia, hypertension who presents with recurrent episodes of left arm weakness. No tPA due to resolution of deficits.  Possible TIA:   Resultant no residual deficits.  Code Stroke CT Head - not ordered  CT head - No acute finding by CT. Age related volume loss. Moderate chronic appearing small vessel changes of the cerebral hemispheric white.   MRI head - No acute infarction, hemorrhage, or mass.  MRA head - not ordered  CTA H&N - No sign of acute large or medium vessel occlusion. Tortuous vessels as might be seen with a history of hypertension. Fairly minimal atherosclerotic disease for a person of this age. No carotid bifurcation stenosis. Ordinary age related atherosclerosis of the carotid siphons without flow limiting stenosis. Posterior circulation is diminutive, due to the fact that the posterior cerebral arteries receive most of there supply from the anterior circulation.   CT Perfusion - not ordered  Carotid Doppler - CTA neck performed - carotid dopplers not indicated.  2D Echo - EF 60 - 65%. No cardiac source of emboli identified.   Sars Corona Virus 2 - pending  LDL - 125  HgbA1c - 8.6  UDS - not ordered  VTE prophylaxis - SCDs Diet  Diet Order            Diet Carb Modified Fluid consistency: Thin; Room service appropriate? Yes  Diet effective now               No antithrombotic prior to admission, now on aspirin 84m daily and clopidogrel 75 mg daily  Patient counseled to be compliant with her antithrombotic medications  Ongoing aggressive stroke risk factor management  Therapy recommendations:  pending  Disposition:  Pending  Hypertension  Home BP meds: Nifedipine : Benicar HCT  Current BP meds: none   Stable . Permissive hypertension (OK if < 220/120) but gradually normalize in 5-7 days   . Long-term BP goal normotensive  Hyperlipidemia  Home Lipid lowering medication: Lipitor 20 mg daily   LDL 125, goal < 70  Current lipid  lowering medication: Lipitor 20 mg daily   Continue statin at discharge  Diabetes  Home diabetic meds: insulin and metformin  Current diabetic meds: SSI   HgbA1c 8.6, goal < 7.0 Recent Labs    08/14/19 0423  GLUCAP 160*     Other Stroke Risk Factors  Advanced age  Obesity, Body mass index is 35.15 kg/m., recommend weight loss, diet and exercise as appropriate   Other Active Problems  Code status - Full Code  Anemia - 11.4  Creatinine - 1.06  Hypokalemia - 3.3  Hospital day # 0  She presented with 2 transient episodes of left upper extremity weakness and numbness likely due to right 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  She remains at risk for recurrent strokes and TIAs.  Recommend aspirin and Plavix for 3 weeks followed by aspirin alone and aggressive risk factor modification.  I discussed briefly with the patient and daughter possible participation in the [employer] stroke prevention trial.  Patient showed some initial interest but after reading the consent form she decided not to participate.  Discussed with Dr. HJerilee Hoh  Greater than 50% time during this 35-minute visit was spent on counseling and coordination of care about her TIA and discussion about stroke prevention and treatment and answering questions.  PAntony Contras MD  To contact Stroke Continuity provider, please refer to [email or web address] After hours, contact General Neurology

## 2019-08-14 NOTE — ED Notes (Signed)
[facility] notified (Tammy) - patient ready for transport 

## 2019-08-14 NOTE — Progress Notes (Signed)
Patient seen and examined, database reviewed, discussed with daughter at bedside all questions answered.  She was admitted earlier today with left arm weakness that was present for about 30 minutes and resolved by time of arrival.  She was admitted for TIA evaluation.  MRI of the brain was negative for acute infarction, hemorrhage or mass.  2D echocardiogram showed ejection fraction of 60 to 65% with left ventricular hypertrophy and grade 1 diastolic dysfunction.  Dopplers are pending.  OT is recommending home health OT, PT consultation is pending.  Anticipate discharge home tomorrow after completion of stroke work-up.  Will continue to follow.  Peggye Pitt, MD [facility] [phone]

## 2019-08-15 ENCOUNTER — Encounter (HOSPITAL_COMMUNITY): Payer: Medicaid Other

## 2019-08-15 DIAGNOSIS — M6281 Muscle weakness (generalized): Secondary | ICD-10-CM | POA: Diagnosis not present

## 2019-08-15 LAB — CBC
HCT: 33.5 % — ABNORMAL LOW (ref 36.0–46.0)
Hemoglobin: 10.5 g/dL — ABNORMAL LOW (ref 12.0–15.0)
MCH: 28.5 pg (ref 26.0–34.0)
MCHC: 31.3 g/dL (ref 30.0–36.0)
MCV: 90.8 fL (ref 80.0–100.0)
Platelets: 278 10*3/uL (ref 150–400)
RBC: 3.69 MIL/uL — ABNORMAL LOW (ref 3.87–5.11)
RDW: 13.3 % (ref 11.5–15.5)
WBC: 5.1 10*3/uL (ref 4.0–10.5)
nRBC: 0 % (ref 0.0–0.2)

## 2019-08-15 LAB — BASIC METABOLIC PANEL
Anion gap: 9 (ref 5–15)
BUN: 13 mg/dL (ref 8–23)
CO2: 27 mmol/L (ref 22–32)
Calcium: 8.9 mg/dL (ref 8.9–10.3)
Chloride: 106 mmol/L (ref 98–111)
Creatinine, Ser: 0.89 mg/dL (ref 0.44–1.00)
GFR calc Af Amer: >60 mL/min (ref >60)
GFR calc non Af Amer: >60 mL/min (ref >60)
Glucose, Bld: 171 mg/dL — ABNORMAL HIGH (ref 70–99)
Potassium: 3.8 mmol/L (ref 3.5–5.1)
Sodium: 142 mmol/L (ref 135–145)

## 2019-08-15 LAB — GLUCOSE, CAPILLARY
Glucose-Capillary: 136 mg/dL — ABNORMAL HIGH (ref 70–99)
Glucose-Capillary: 154 mg/dL — ABNORMAL HIGH (ref 70–99)
Glucose-Capillary: 182 mg/dL — ABNORMAL HIGH (ref 70–99)
Glucose-Capillary: 211 mg/dL — ABNORMAL HIGH (ref 70–99)

## 2019-08-15 MED ORDER — CLOPIDOGREL BISULFATE 75 MG PO TABS: 75.0000 mg | ORAL_TABLET | Freq: Every day | ORAL | 0 refills | Status: AC

## 2019-08-15 MED ORDER — ASPIRIN 325 MG PO TABS: 325.0000 mg | ORAL_TABLET | Freq: Every day | ORAL | 11 refills | Status: AC

## 2019-08-15 MED FILL — CLOPIDOGREL 75 MG TABLET: 75 | 21 days supply | Qty: 21 | Fill #0

## 2019-08-15 MED FILL — ASPIRIN EC 325 MG TABLET: 325 | 30 days supply | Qty: 30 | Fill #0

## 2019-08-15 MED FILL — DORZOLAMIDE HCL-TIMOLOL MAL: 22.3-6.8 | 50 days supply | Qty: 10 | Fill #1

## 2019-08-15 NOTE — Discharge Summary (Signed)
Physician Discharge Summary  Adams EGB:[id number] DOB: 1945/08/02 DOA: 08/13/2019  PCP: Deborah Chalk, FNP  Admit date: 08/13/2019 Discharge date: 08/15/2019  Time spent: 45 minutes  Recommendations for Outpatient Follow-up:  -Will be discharged home today. -HH PT/OT services will be arranged prior to DC. -Advised follow up with PCP in 2-3 weeks.   Discharge Diagnoses:  Principal Problem:   Weakness of left upper extremity Active Problems:   Essential hypertension, benign   Diabetes mellitus type 2, controlled ([employer])   Discharge Condition: Stable and improved  Filed Weights   08/13/19 2139  Weight: 81.6 kg    History of present illness:  As per Dr. Shanon Brow on 3/7: Cynthia Mata is a 74 y.o. female with medical history significant of diabetes, hypertension comes in because of left arm weakness that occurred for about 30 minutes and then resolved.  She has had no recent illnesses.  No nausea vomiting diarrhea.  No chest pain or shortness of breath.  She denies any facial drooping or weakness while this happened.  Patient is being referred for admission for TIA work-up.  Hospital Course:   TIA -All symptoms resolved at time of DC. -MRI negative for acute ischemic disease, mass or hemorrhage. -HH PT/OT has been recommended, will order on DC. -Plan ASA/Plavix for 21 days, then ASA alone. -2D ECHO: 1. Normal LV systolic function; severe LVH; grade 1 diastolic  dysfunction; severe MAC.  2. Left ventricular ejection fraction, by estimation, is 60 to 65%. The  left ventricle has normal function. The left ventricle has no regional  wall motion abnormalities. There is severe left ventricular hypertrophy.  Left ventricular diastolic parameters  are consistent with Grade I diastolic dysfunction (impaired relaxation).  Elevated left atrial pressure.  3. Right ventricular systolic function is normal. The right ventricular  size is normal. There is  normal pulmonary artery systolic pressure.   -LDL 104, started on lipitor 20. -CT neck without significant carotid artery stenosis. -Neurology was consulted and provided recommendations.  HTN -Well controlled this admission.  DM 2 -Resume home meds on DC. -Fair control while hospitalized. -A1c 8.5.  Procedures:  As above   Consultations:  Neurology  Discharge Instructions  Discharge Instructions    Diet - low sodium heart healthy   Complete by: As directed    Increase activity slowly   Complete by: As directed      Allergies as of 08/15/2019   No Known Allergies     Medication List    STOP taking these medications   alendronate 10 MG tablet Commonly known as: FOSAMAX   NIFEdipine 60 MG 24 hr tablet Commonly known as: Nifediac CC     TAKE these medications   amLODipine 10 MG tablet Commonly known as: NORVASC Take 10 mg by mouth daily.   aspirin 325 MG tablet Take 1 tablet (325 mg total) by mouth daily. Start taking on: August 16, 2019   atorvastatin 20 MG tablet Commonly known as: LIPITOR Take 1 tablet (20 mg total) by mouth daily. What changed: when to take this   brimonidine 0.2 % ophthalmic solution Commonly known as: ALPHAGAN Place 1 drop into both eyes 3 (three) times daily.   CAMPHOR EX Apply 1 application topically at bedtime as needed (foot pain).   clopidogrel 75 MG tablet Commonly known as: PLAVIX Take 1 tablet (75 mg total) by mouth daily for 21 days. Start taking on: August 16, 2019   dorzolamide-timolol 22.3-6.8 MG/ML ophthalmic  solution Commonly known as: COSOPT Place 1 drop into both eyes 2 (two) times daily.   glucose blood test strip Commonly known as: OneTouch Verio Use to check blood sugars 3 times daily. Dx Code E11.9 What changed: Another medication with the same name was changed. Make sure you understand how and when to take each.   glucose blood test strip Commonly known as: Civil engineer, contracting Use as instructed What  changed:   how much to take  how to take this  when to take this   ONE TOUCH ULTRA TEST test strip Generic drug: glucose blood Use to check blood sugar 3 times per day dx code E11.9 What changed: Another medication with the same name was changed. Make sure you understand how and when to take each.   insulin aspart 100 UNIT/ML FlexPen Commonly known as: NovoLOG FlexPen Inject 6 units with breakfast and lunch and 8 units with supper What changed:   how much to take  how to take this  when to take this  additional instructions   Insulin Glargine 300 UNIT/ML Sopn Commonly known as: Toujeo Max SoloStar Inject 34 Units into the skin daily. What changed: when to take this   latanoprost 0.005 % ophthalmic solution Commonly known as: XALATAN Place 1 drop into both eyes at bedtime.   liraglutide 18 MG/3ML Sopn Commonly known as: VICTOZA Inject 1.8 mg into the skin daily before breakfast.   metFORMIN 500 MG 24 hr tablet Commonly known as: GLUCOPHAGE-XR Take 1 tablet (500 mg total) by mouth 3 (three) times daily. What changed: when to take this   olmesartan-hydrochlorothiazide 40-25 MG tablet Commonly known as: BENICAR HCT TAKE 1 TABLET BY MOUTH DAILY. What changed: Another medication with the same name was removed. Continue taking this medication, and follow the directions you see here.   TechLite Pen Needles 32G X 6 MM Misc Generic drug: Insulin Pen Needle USE TO INJECT INSULIN FOUR TIMES DAILY What changed: See the new instructions.   Vitamin D (Ergocalciferol) 1.25 MG (50000 UNIT) Caps capsule Commonly known as: DRISDOL Take 50,000 Units by mouth every Monday.      No Known Allergies    The results of significant diagnostics from this hospitalization (including imaging, microbiology, ancillary and laboratory) are listed below for reference.    Significant Diagnostic Studies: CT Angio Head W or Wo Contrast  Result Date: 08/14/2019 CLINICAL DATA:  TIA.   Episodes of left arm weakness. EXAM: CT ANGIOGRAPHY HEAD AND NECK TECHNIQUE: Multidetector CT imaging of the head and neck was performed using the standard protocol during bolus administration of intravenous contrast. Multiplanar CT image reconstructions and MIPs were obtained to evaluate the vascular anatomy. Carotid stenosis measurements (when applicable) are obtained utilizing NASCET criteria, using the distal internal carotid diameter as the denominator. CONTRAST:  147m OMNIPAQUE IOHEXOL 350 MG/ML SOLN COMPARISON:  Head CT earlier same day FINDINGS: CTA NECK FINDINGS Aortic arch: Minimal aortic atherosclerosis. No aneurysm or dissection. Branching pattern is normal without origin stenosis. Right carotid system: Common carotid artery widely patent to the bifurcation. Minimal calcified plaque at the ICA bulb but no stenosis. Carotid artery tortuous. Left carotid system: Common carotid artery widely patent to the bifurcation. No soft or calcified plaque at the carotid bifurcation or ICA bulb. No stenosis. The vessel is tortuous. Vertebral arteries: Both vertebral artery origins are widely patent. Both vertebral arteries appear normal through the cervical region to the foramen magnum. Skeleton: Ordinary cervical spondylosis. Other neck: No mass or lymphadenopathy. Upper chest:  Normal Review of the MIP images confirms the above findings CTA HEAD FINDINGS Anterior circulation: Both internal carotid arteries are patent through the skull base and siphon regions. Ordinary mild siphon calcification but no stenosis greater than 30%. The anterior and middle cerebral vessels are patent without proximal stenosis, aneurysm or vascular malformation. No large or medium vessel occlusion is identified. Posterior circulation: Both vertebral arteries are patent to the basilar. Mild atherosclerotic change of the V4 segments. The basilar artery is diminutive, due to the fact that both posterior cerebral arteries receive most of there  supply from the anterior circulation. Superior cerebellar and posterior cerebral vessels appear normal. Venous sinuses: Patent and normal. Anatomic variants: None significant otherwise. Incidental proximal basilar fenestration, not significant. Review of the MIP images confirms the above findings IMPRESSION: No sign of acute large or medium vessel occlusion. Tortuous vessels as might be seen with a history of hypertension. Fairly minimal atherosclerotic disease for a person of this age. No carotid bifurcation stenosis. Ordinary age related atherosclerosis of the carotid siphons without flow limiting stenosis. Posterior circulation is diminutive, due to the fact that the posterior cerebral arteries receive most of there supply from the anterior circulation. Electronically Signed   By: Nelson Chimes M.D.   On: 08/14/2019 01:05   CT Head Wo Contrast  Result Date: 08/13/2019 CLINICAL DATA:  Episode of left arm weakness and discoordination. Symptoms presently resolved. EXAM: CT HEAD WITHOUT CONTRAST TECHNIQUE: Contiguous axial images were obtained from the base of the skull through the vertex without intravenous contrast. COMPARISON:  None. FINDINGS: Brain: Age related volume loss. No focal finding affects the brainstem or cerebellum. Cerebral hemispheres show moderate chronic appearing small vessel changes of the white matter. No sign of cortical infarction. No mass lesion, hemorrhage, hydrocephalus or extra-axial collection. Vascular: There is atherosclerotic calcification of the major vessels at the base of the brain. Skull: Negative Sinuses/Orbits: Clear/normal Other: None IMPRESSION: No acute finding by CT. Age related volume loss. Moderate chronic appearing small vessel changes of the cerebral hemispheric white. Electronically Signed   By: Nelson Chimes M.D.   On: 08/13/2019 22:55   CT Angio Neck W and/or Wo Contrast  Result Date: 08/14/2019 CLINICAL DATA:  TIA.  Episodes of left arm weakness. EXAM: CT  ANGIOGRAPHY HEAD AND NECK TECHNIQUE: Multidetector CT imaging of the head and neck was performed using the standard protocol during bolus administration of intravenous contrast. Multiplanar CT image reconstructions and MIPs were obtained to evaluate the vascular anatomy. Carotid stenosis measurements (when applicable) are obtained utilizing NASCET criteria, using the distal internal carotid diameter as the denominator. CONTRAST:  185m OMNIPAQUE IOHEXOL 350 MG/ML SOLN COMPARISON:  Head CT earlier same day FINDINGS: CTA NECK FINDINGS Aortic arch: Minimal aortic atherosclerosis. No aneurysm or dissection. Branching pattern is normal without origin stenosis. Right carotid system: Common carotid artery widely patent to the bifurcation. Minimal calcified plaque at the ICA bulb but no stenosis. Carotid artery tortuous. Left carotid system: Common carotid artery widely patent to the bifurcation. No soft or calcified plaque at the carotid bifurcation or ICA bulb. No stenosis. The vessel is tortuous. Vertebral arteries: Both vertebral artery origins are widely patent. Both vertebral arteries appear normal through the cervical region to the foramen magnum. Skeleton: Ordinary cervical spondylosis. Other neck: No mass or lymphadenopathy. Upper chest: Normal Review of the MIP images confirms the above findings CTA HEAD FINDINGS Anterior circulation: Both internal carotid arteries are patent through the skull base and siphon regions. Ordinary mild siphon calcification  but no stenosis greater than 30%. The anterior and middle cerebral vessels are patent without proximal stenosis, aneurysm or vascular malformation. No large or medium vessel occlusion is identified. Posterior circulation: Both vertebral arteries are patent to the basilar. Mild atherosclerotic change of the V4 segments. The basilar artery is diminutive, due to the fact that both posterior cerebral arteries receive most of there supply from the anterior circulation.  Superior cerebellar and posterior cerebral vessels appear normal. Venous sinuses: Patent and normal. Anatomic variants: None significant otherwise. Incidental proximal basilar fenestration, not significant. Review of the MIP images confirms the above findings IMPRESSION: No sign of acute large or medium vessel occlusion. Tortuous vessels as might be seen with a history of hypertension. Fairly minimal atherosclerotic disease for a person of this age. No carotid bifurcation stenosis. Ordinary age related atherosclerosis of the carotid siphons without flow limiting stenosis. Posterior circulation is diminutive, due to the fact that the posterior cerebral arteries receive most of there supply from the anterior circulation. Electronically Signed   By: Nelson Chimes M.D.   On: 08/14/2019 01:05   MR BRAIN WO CONTRAST  Result Date: 08/14/2019 CLINICAL DATA:  Stroke, follow-up EXAM: MRI HEAD WITHOUT CONTRAST TECHNIQUE: Multiplanar, multiecho pulse sequences of the brain and surrounding structures were obtained without intravenous contrast. COMPARISON:  Correlation made with recent CT imaging FINDINGS: Brain: There is no acute infarction or intracranial hemorrhage. There is no intracranial mass, mass effect, or edema. There is no hydrocephalus or extra-axial fluid collection. Prominence of the ventricles and sulci reflects mild generalized parenchymal volume loss. Patchy and confluent areas of T2 hyperintensity in the supratentorial white matter are nonspecific but probably reflect moderate chronic microvascular ischemic changes. Vascular: Major vessel flow voids at the skull base are preserved. Skull and upper cervical spine: Normal marrow signal is preserved. Sinuses/Orbits: Minor mucosal thickening. Bilateral lens replacements. Other: Partially empty sella.  Mastoid air cells are clear. IMPRESSION: No acute infarction, hemorrhage, or mass. Moderate chronic microvascular ischemic changes. Electronically Signed   By:  Macy Mis M.D.   On: 08/14/2019 07:39   ECHOCARDIOGRAM COMPLETE  Result Date: 08/14/2019    ECHOCARDIOGRAM REPORT   Patient Name:   Cynthia Mata Date of Exam: 08/14/2019 Medical Rec #:  [id number]                  Height:       60.0 in Accession #:    [id number]                 Weight:       180.0 lb Date of Birth:  Oct 06, 1945                  BSA:          1.785 m Patient Age:    43 years                   BP:           137/63 mmHg Patient Gender: F                          HR:           76 bpm. Exam Location:  Inpatient Procedure: 2D Echo, Cardiac Doppler and Color Doppler Indications:    Abnormal ECG 794.31/R94.31  History:        Patient has no prior history of Echocardiogram examinations.  Arrythmias:LBBB; Risk Factors:Hypertension, Diabetes,                 Dyslipidemia and Non-Smoker.  Sonographer:    Clayton Lefort RDCS (AE) Referring Phys: [place]  1. Normal LV systolic function; severe LVH; grade 1 diastolic dysfunction; severe MAC.  2. Left ventricular ejection fraction, by estimation, is 60 to 65%. The left ventricle has normal function. The left ventricle has no regional wall motion abnormalities. There is severe left ventricular hypertrophy. Left ventricular diastolic parameters  are consistent with Grade I diastolic dysfunction (impaired relaxation). Elevated left atrial pressure.  3. Right ventricular systolic function is normal. The right ventricular size is normal. There is normal pulmonary artery systolic pressure.  4. The mitral valve is normal in structure and function. No evidence of mitral valve regurgitation. No evidence of mitral stenosis.  5. The aortic valve is tricuspid. Aortic valve regurgitation is not visualized. Mild to moderate aortic valve sclerosis/calcification is present, without any evidence of aortic stenosis.  6. The inferior vena cava is normal in size with greater than 50% respiratory variability, suggesting right atrial  pressure of 3 mmHg. FINDINGS  Left Ventricle: Left ventricular ejection fraction, by estimation, is 60 to 65%. The left ventricle has normal function. The left ventricle has no regional wall motion abnormalities. The left ventricular internal cavity size was normal in size. There is  severe left ventricular hypertrophy. Abnormal (paradoxical) septal motion, consistent with left bundle branch block. Left ventricular diastolic parameters are consistent with Grade I diastolic dysfunction (impaired relaxation). Elevated left atrial pressure. Right Ventricle: The right ventricular size is normal. Right ventricular systolic function is normal. There is normal pulmonary artery systolic pressure. The tricuspid regurgitant velocity is 2.14 m/s, and with an assumed right atrial pressure of 8 mmHg,  the estimated right ventricular systolic pressure is 44.0 mmHg. Left Atrium: Left atrial size was normal in size. Right Atrium: Right atrial size was normal in size. Pericardium: There is no evidence of pericardial effusion. Mitral Valve: The mitral valve is normal in structure and function. Normal mobility of the mitral valve leaflets. Severe mitral annular calcification. No evidence of mitral valve regurgitation. No evidence of mitral valve stenosis. MV peak gradient, 11.7  mmHg. The mean mitral valve gradient is 4.0 mmHg. Tricuspid Valve: The tricuspid valve is normal in structure. Tricuspid valve regurgitation is trivial. No evidence of tricuspid stenosis. Aortic Valve: The aortic valve is tricuspid. Aortic valve regurgitation is not visualized. Mild to moderate aortic valve sclerosis/calcification is present, without any evidence of aortic stenosis. Aortic valve mean gradient measures 6.0 mmHg. Aortic valve peak gradient measures 9.5 mmHg. Aortic valve area, by VTI measures 1.91 cm. Pulmonic Valve: The pulmonic valve was not well visualized. Pulmonic valve regurgitation is trivial. No evidence of pulmonic stenosis. Aorta: The  aortic root is normal in size and structure. Venous: The inferior vena cava is normal in size with greater than 50% respiratory variability, suggesting right atrial pressure of 3 mmHg. IAS/Shunts: No atrial level shunt detected by color flow Doppler. Additional Comments: Normal LV systolic function; severe LVH; grade 1 diastolic dysfunction; severe MAC.  LEFT VENTRICLE PLAX 2D LVIDd:         3.30 cm  Diastology LVIDs:         2.20 cm  LV e' lateral:   4.68 cm/s LV PW:         1.80 cm  LV E/e' lateral: 14.9 LV IVS:  1.70 cm  LV e' medial:    3.92 cm/s LVOT diam:     1.80 cm  LV E/e' medial:  17.8 LV SV:         61 LV SV Index:   34 LVOT Area:     2.54 cm  RIGHT VENTRICLE             IVC RV Basal diam:  2.20 cm     IVC diam: 1.80 cm RV S prime:     11.00 cm/s TAPSE (M-mode): 1.8 cm LEFT ATRIUM             Index       RIGHT ATRIUM          Index LA diam:        3.10 cm 1.74 cm/m  RA Area:     7.54 cm LA Vol (A2C):   63.3 ml 35.46 ml/m RA Volume:   11.30 ml 6.33 ml/m LA Vol (A4C):   40.6 ml 22.75 ml/m LA Biplane Vol: 56.3 ml 31.54 ml/m  AORTIC VALVE AV Area (Vmax):    1.87 cm AV Area (Vmean):   1.94 cm AV Area (VTI):     1.91 cm AV Vmax:           154.00 cm/s AV Vmean:          114.000 cm/s AV VTI:            0.321 m AV Peak Grad:      9.5 mmHg AV Mean Grad:      6.0 mmHg LVOT Vmax:         113.00 cm/s LVOT Vmean:        87.000 cm/s LVOT VTI:          0.241 m LVOT/AV VTI ratio: 0.75  AORTA Ao Root diam: 3.00 cm Ao Asc diam:  2.90 cm MITRAL VALVE                TRICUSPID VALVE MV Area (PHT): 2.16 cm     TR Peak grad:   18.3 mmHg MV Peak grad:  11.7 mmHg    TR Vmax:        214.00 cm/s MV Mean grad:  4.0 mmHg MV Vmax:       1.71 m/s     SHUNTS MV Vmean:      85.0 cm/s    Systemic VTI:  0.24 m MV Decel Time: 351 msec     Systemic Diam: 1.80 cm MV E velocity: 69.70 cm/s MV A velocity: 147.00 cm/s MV E/A ratio:  0.47 Kirk Ruths MD Electronically signed by Kirk Ruths MD Signature Date/Time:  08/14/2019/2:20:11 PM    Final     Microbiology: Recent Results (from the past 240 hour(s))  SARS CORONAVIRUS 2 (TAT 6-24 HRS) Nasopharyngeal Nasopharyngeal Swab     Status: None   Collection Time: 08/13/19 11:35 PM   Specimen: Nasopharyngeal Swab  Result Value Ref Range Status   SARS Coronavirus 2 NEGATIVE NEGATIVE Final    Comment: (NOTE) SARS-CoV-2 target nucleic acids are NOT DETECTED. The SARS-CoV-2 RNA is generally detectable in upper and lower respiratory specimens during the acute phase of infection. Negative results do not preclude SARS-CoV-2 infection, do not rule out co-infections with other pathogens, and should not be used as the sole basis for treatment or other patient management decisions. Negative results must be combined with clinical observations, patient history, and epidemiological information. The expected result is Negative. Fact Sheet for Patients: SugarRoll.be  Fact Sheet for Healthcare Providers: https://www.woods-mathews.com/ This test is not yet approved or cleared by the [place] FDA and  has been authorized for detection and/or diagnosis of SARS-CoV-2 by FDA under an Emergency Use Authorization (EUA). This EUA will remain  in effect (meaning this test can be used) for the duration of the COVID-19 declaration under Section 56 4(b)(1) of the Act, 21 U.S.C. section 360bbb-3(b)(1), unless the authorization is terminated or revoked sooner. Performed at [facility], [place] [address] [place], [place] [address]      Labs: Basic Metabolic Panel: Recent Labs  Lab 08/13/19 2236 08/15/19 0442  NA 136 142  K 3.3* 3.8  CL 97* 106  CO2 27 27  GLUCOSE 211* 171*  BUN 24* 13  CREATININE 1.06* 0.89  CALCIUM 9.3 8.9   Liver Function Tests: No results for input(s): AST, ALT, ALKPHOS, BILITOT, PROT, ALBUMIN in the last 168 hours. No results for input(s): LIPASE, AMYLASE in the last 168 hours. No results for  input(s): AMMONIA in the last 168 hours. CBC: Recent Labs  Lab 08/13/19 2236 08/15/19 0442  WBC 7.9 5.1  NEUTROABS 4.9  --   HGB 11.4* 10.5*  HCT 35.7* 33.5*  MCV 92.2 90.8  PLT 274 278   Cardiac Enzymes: No results for input(s): CKTOTAL, CKMB, CKMBINDEX, TROPONINI in the last 168 hours. BNP: BNP (last 3 results) No results for input(s): BNP in the last 8760 hours.  ProBNP (last 3 results) No results for input(s): PROBNP in the last 8760 hours.  CBG: Recent Labs  Lab 08/14/19 1537 08/14/19 2018 08/15/19 0014 08/15/19 0403 08/15/19 0755  GLUCAP 202* 298* 154* 136* 182*       Signed:  Lelon Frohlich  [facility] Pager: [phone] 08/15/2019, 11:30 AM

## 2019-08-15 NOTE — Progress Notes (Signed)
Inpatient Diabetes Program Recommendations  AACE/ADA: New Consensus Statement on Inpatient Glycemic Control   Target Ranges:  Prepandial:   less than 140 mg/dL      Peak postprandial:   less than 180 mg/dL (1-2 hours)      Critically ill patients:  140 - 180 mg/dL  Results for AKYRA, BOUCHIE (MRN [id number]) as of 08/15/2019 10:21  Ref. Range 08/14/2019 07:33 08/14/2019 11:46 08/14/2019 15:37 08/14/2019 20:18 08/15/2019 00:14 08/15/2019 04:03 08/15/2019 07:55  Glucose-Capillary Latest Ref Range: 70 - 99 mg/dL 224 (H) 497 (H) 530 (H) 298 (H) 154 (H) 136 (H) 182 (H)    Review of Glycemic Control  Diabetes history: DM2 Outpatient Diabetes medications: Toujeo 34 units QAM, Novolog 6 units with breakfast and lunch, Novolog 8 units with supper, Victoza 1.8 mg daily, Metformin XR 500 mg BID Current orders for Inpatient glycemic control: Novolog 0-9 units TID with meals  Inpatient Diabetes Program Recommendations:    Insulin-Correction: Please consider ordering Novolog 0-5 units QHS for bedtime correction.  Insulin-Meal Coverage: Please consider ordering Novolog 3 units TID with meals for meal coverage if patient eats at least 50% of meals.  Thanks, Orlando Penner, RN, MSN, CDE Diabetes Coordinator Inpatient Diabetes Program [phone] (Team Pager from 8am to 5pm)

## 2019-08-15 NOTE — Progress Notes (Signed)
SLP Cancellation Note  Patient Details Name: Cynthia Mata MRN: [id number] DOB: 09-Apr-1946   Cancelled treatment:       Reason Eval/Treat Not Completed: SLP screened, no needs identified, will sign off   Koehn Salehi, Riley Nearing 08/15/2019, 7:45 AM

## 2019-08-15 NOTE — Plan of Care (Signed)
Patient stable, discussed POC with patient and daughter, agreeable with plan, denies question/concerns at this time.  

## 2019-08-15 NOTE — Progress Notes (Signed)
D/C instructions provided to patient, denies questions/concerns at this time. Patient transported to front entrance via WC, tol well. 

## 2019-08-15 NOTE — Evaluation (Signed)
Physical Therapy Evaluation Patient Details Name: Cynthia Mata MRN: [id number] DOB: 10-22-1945 Today's Date: 08/15/2019   History of Present Illness  74 y.o. female with history of diabetes, hyperlipidemia, hypertension  with a history of diabetes, hyperlipidemia, hypertension who presents with recurrent episodes of left arm weakness that has happened twice now.  MRI -. 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  Clinical Impression  Pt in bed upon arrival of PT, agreeable to evaluation at this time. Prior to admission the pt was independent without use of AD for ambulation and needing only some assist for errands from daughter who she lives with. The pt now presents with limitations in functional mobility and dynamic stability due to above dx, and will continue to benefit from skilled PT to address these deficits. The pt and her daughter would prefer OPPT services as the pt has 5 children in the area who can assist with taking her to OPPT visits, and feel this would help her to return to her prior independence most efficiently.      Follow Up Recommendations Outpatient PT;Supervision - Intermittent    Equipment Recommendations  None recommended by PT(pt has RW)    Recommendations for Other Services       Precautions / Restrictions Precautions Precautions: Fall Restrictions Weight Bearing Restrictions: No      Mobility  Bed Mobility Overal bed mobility: Independent                Transfers Overall transfer level: Modified independent Equipment used: Rolling walker (2 wheeled);None             General transfer comment: pt able to complete without use of AD, minG for safety first time up, but modI through session  Ambulation/Gait Ambulation/Gait assistance: Modified independent (Device/Increase time) Gait Distance (Feet): 150 Feet Assistive device: Rolling walker (2 wheeled);None Gait Pattern/deviations: WFL(Within Functional  Limits);Step-through pattern;Decreased stride length   Gait velocity interpretation: <1.8 ft/sec, indicate of risk for recurrent falls General Gait Details: pt ambulates slowly but with good stability with RW. pt reports improved comfort with use of RW. pt also able to demo good ambulation without RW for short distances in room with HHA of 1  Stairs            Wheelchair Mobility    Modified Rankin (Stroke Patients Only) Modified Rankin (Stroke Patients Only) Pre-Morbid Rankin Score: Slight disability Modified Rankin: Moderately severe disability     Balance Overall balance assessment: Mild deficits observed, not formally tested;History of Falls                                           Pertinent Vitals/Pain Pain Assessment: No/denies pain    Home Living Family/patient expects to be discharged to:: Private residence Living Arrangements: Children(5 children all live nearby, lives with daughter) Available Help at Discharge: Family;Available 24 hours/day Type of Home: House Home Access: Level entry     Home Layout: One level Home Equipment: Walker - 2 wheels;Cane - single point      Prior Function Level of Independence: Independent         Comments: fear of falling, pt reports she ambulates without AD, uses family members for errands,     Hand Dominance   Dominant Hand: Right    Extremity/Trunk Assessment   Upper Extremity Assessment Upper Extremity Assessment: Defer to OT evaluation  Lower Extremity Assessment Lower Extremity Assessment: Overall WFL for tasks assessed    Cervical / Trunk Assessment Cervical / Trunk Assessment: Normal  Communication   Communication: Prefers language other than English(spanish, but pt declined need for use of translator)  Cognition Arousal/Alertness: Awake/alert Behavior During Therapy: WFL for tasks assessed/performed Overall Cognitive Status: Within Functional Limits for tasks assessed                                         General Comments General comments (skin integrity, edema, etc.): pt able to demo good stability with head turns, changes in speed, and stading marches for dynamic SLS challenge. no LOB    Exercises     Assessment/Plan    PT Assessment Patient needs continued PT services  PT Problem List Decreased strength;Decreased mobility;Decreased coordination;Decreased balance;Decreased knowledge of use of DME       PT Treatment Interventions DME instruction;Therapeutic exercise;Gait training;Balance training;Functional mobility training;Patient/family education;Therapeutic activities    PT Goals (Current goals can be found in the Care Plan section)  Acute Rehab PT Goals Patient Stated Goal: to be independent and not fall PT Goal Formulation: With patient Time For Goal Achievement: 08/29/19 Potential to Achieve Goals: Good    Frequency Min 3X/week   Barriers to discharge        Co-evaluation               AM-PAC PT "6 Clicks" Mobility  Outcome Measure Help needed turning from your back to your side while in a flat bed without using bedrails?: None Help needed moving from lying on your back to sitting on the side of a flat bed without using bedrails?: None Help needed moving to and from a bed to a chair (including a wheelchair)?: None Help needed standing up from a chair using your arms (e.g., wheelchair or bedside chair)?: None Help needed to walk in hospital room?: A Little Help needed climbing 3-5 steps with a railing? : A Little 6 Click Score: 22    End of Session Equipment Utilized During Treatment: Gait belt Activity Tolerance: Patient tolerated treatment well Patient left: in bed;with family/visitor present;with call bell/phone within reach;with bed alarm set(sitting EOB) Nurse Communication: Mobility status PT Visit Diagnosis: Unsteadiness on feet (R26.81);Muscle weakness (generalized) (M62.81);Difficulty in walking, not  elsewhere classified (R26.2)    Time: 1062-6948 PT Time Calculation (min) (ACUTE ONLY): 27 min   Charges:   PT Evaluation $PT Eval Low Complexity: 1 Low PT Treatments $Gait Training: 8-22 mins        Karma Ganja, PT, DPT   Acute Rehabilitation Department Pager #: [phone]  Otho Bellows 08/15/2019, 1:03 PM

## 2019-08-15 NOTE — Plan of Care (Signed)
  Problem: Education: Goal: Knowledge of disease or condition will improve Outcome: Progressing Goal: Knowledge of secondary prevention will improve Outcome: Progressing Goal: Knowledge of patient specific risk factors addressed and post discharge goals established will improve Outcome: Progressing Goal: Individualized Educational Video(s) Outcome: Progressing   Problem: Coping: Goal: Will verbalize positive feelings about self Outcome: Progressing Goal: Will identify appropriate support needs Outcome: Progressing    Pt progressing well with plan to go home today with Home Health.

## 2019-08-31 MED FILL — OLMESARTAN-HCTZ 40-25 MG TA: 40-25 | 30 days supply | Qty: 30 | Fill #0

## 2019-09-15 MED FILL — ASPIRIN EC 325 MG TABLET: 325 | 30 days supply | Qty: 30 | Fill #1

## 2019-09-15 MED FILL — BRIMONIDINE TARTRATE 0.2 %: 0.2 | 30 days supply | Qty: 10 | Fill #3

## 2019-09-15 MED FILL — ULTICARE PEN NDL 4MM 32G: 32G X 4 MM | 25 days supply | Qty: 100 | Fill #1

## 2019-09-19 MED FILL — TOUJEO MAX SOLOSTAR 300 UNI: 300 | 80 days supply | Qty: 9 | Fill #1

## 2019-09-21 MED FILL — NOVOLOG FLEXPEN SYRINGE: 100 | 60 days supply | Qty: 12 | Fill #2

## 2019-09-30 MED FILL — OLMESARTAN-HCTZ 40-25 MG TA: 40-25 | 30 days supply | Qty: 30 | Fill #1

## 2019-09-30 MED FILL — DORZOLAMIDE HCL-TIMOLOL MAL: 22.3-6.8 | 50 days supply | Qty: 10 | Fill #2

## 2019-10-12 MED FILL — ASPIRIN EC 325 MG TABLET: 325 | 30 days supply | Qty: 30 | Fill #2

## 2019-10-12 MED FILL — ULTICARE PEN NDL 4MM 32G: 32G X 4 MM | 25 days supply | Qty: 100 | Fill #2

## 2019-10-20 ENCOUNTER — Other Ambulatory Visit (HOSPITAL_BASED_OUTPATIENT_CLINIC_OR_DEPARTMENT_OTHER): Payer: Self-pay | Admitting: Ophthalmology

## 2019-10-21 ENCOUNTER — Other Ambulatory Visit (HOSPITAL_BASED_OUTPATIENT_CLINIC_OR_DEPARTMENT_OTHER): Payer: Self-pay | Admitting: Ophthalmology

## 2019-10-28 MED FILL — OLMESARTAN-HCTZ 40-25 MG TA: 40-25 | 30 days supply | Qty: 30 | Fill #2

## 2019-10-31 MED FILL — ULTICARE PEN NDL 4MM 32G: 32G X 4 MM | 25 days supply | Qty: 100 | Fill #3

## 2019-11-15 ENCOUNTER — Other Ambulatory Visit (HOSPITAL_BASED_OUTPATIENT_CLINIC_OR_DEPARTMENT_OTHER): Payer: Self-pay | Admitting: Ophthalmology

## 2019-12-01 MED FILL — OLMESARTAN-HCTZ 40-25 MG TA: 40-25 | 30 days supply | Qty: 30 | Fill #3

## 2019-12-01 MED FILL — ULTICARE PEN NDL 4MM 32G: 32G X 4 MM | 25 days supply | Qty: 100 | Fill #4

## 2019-12-19 MED FILL — METOPROLOL TARTRATE 25 MG T: 25 | 30 days supply | Qty: 60 | Fill #1

## 2019-12-27 MED FILL — DORZOLAMIDE HCL-TIMOLOL MAL: 22.3-6.8 | 50 days supply | Qty: 10 | Fill #3

## 2019-12-27 MED FILL — VIT D2 1.25 MG (50,000 UNIT: 1.25 MG | 28 days supply | Qty: 4 | Fill #1

## 2019-12-27 MED FILL — OLMESARTAN-HCTZ 40-25 MG TA: 40-25 | 30 days supply | Qty: 30 | Fill #4

## 2019-12-27 MED FILL — TOUJEO MAX SOLOSTAR 300 UNI: 300 | 80 days supply | Qty: 9 | Fill #2

## 2019-12-27 MED FILL — ULTICARE PEN NDL 4MM 32G: 32G X 4 MM | 25 days supply | Qty: 100 | Fill #5

## 2019-12-27 MED FILL — BRIMONIDINE TARTRATE 0.2 %: 0.2 | 34 days supply | Qty: 10 | Fill #1

## 2019-12-29 MED FILL — metFORMIN HCL ER 500 MG TB2: 500 | 90 days supply | Qty: 180 | Fill #0

## 2020-01-11 ENCOUNTER — Other Ambulatory Visit (HOSPITAL_BASED_OUTPATIENT_CLINIC_OR_DEPARTMENT_OTHER): Payer: Self-pay | Admitting: Endocrinology

## 2020-01-11 MED FILL — NovoLOG 100 UNIT/ML SOLN: 100 | 33 days supply | Qty: 10 | Fill #0

## 2020-01-11 MED FILL — ULTICARE SYR 0.3 ML 30GX5/1: 30G X 5/16" | 33 days supply | Qty: 100 | Fill #0

## 2020-01-26 MED FILL — METOPROLOL TARTRATE 25 MG T: 25 | 30 days supply | Qty: 60 | Fill #2

## 2020-02-07 MED FILL — AMLODIPINE BESYLATE 10 MG T: 10 | 90 days supply | Qty: 90 | Fill #0

## 2020-02-08 MED FILL — DORZOLAMIDE HCL-TIMOLOL MAL: 22.3-6.8 | 50 days supply | Qty: 10 | Fill #1

## 2020-02-20 MED FILL — OLMESARTAN-HCTZ 40-25 MG TA: 40-25 | 30 days supply | Qty: 30 | Fill #5

## 2020-02-23 MED FILL — ULTICARE SYR 0.3 ML 30GX5/1: 30G X 5/16" | 33 days supply | Qty: 100 | Fill #1

## 2020-02-23 MED FILL — ULTICARE PEN NDL 4MM 32G: 32G X 4 MM | 25 days supply | Qty: 100 | Fill #6

## 2020-02-23 MED FILL — NovoLOG 100 UNIT/ML SOLN: 100 | 33 days supply | Qty: 10 | Fill #1

## 2020-03-06 MED FILL — METOPROLOL TARTRATE 25 MG T: 25 | 30 days supply | Qty: 60 | Fill #3

## 2020-03-19 MED FILL — DORZOLAMIDE HCL-TIMOLOL MAL: 22.3-6.8 | 50 days supply | Qty: 10 | Fill #2

## 2020-03-19 MED FILL — NovoLOG 100 UNIT/ML SOLN: 100 | 33 days supply | Qty: 10 | Fill #2

## 2020-03-30 ENCOUNTER — Other Ambulatory Visit (HOSPITAL_BASED_OUTPATIENT_CLINIC_OR_DEPARTMENT_OTHER): Payer: Self-pay | Admitting: Nurse Practitioner

## 2020-03-30 MED FILL — OLMESARTAN-HCTZ 40-25 MG TA: 40-25 | 30 days supply | Qty: 30 | Fill #0

## 2020-04-04 MED FILL — ULTICARE SYR 0.3 ML 30GX5/1: 30G X 5/16" | 26 days supply | Qty: 80 | Fill #2

## 2020-04-12 MED FILL — TOUJEO MAX SOLOSTAR 300 UNI: 300 | 26 days supply | Qty: 3 | Fill #3

## 2020-04-12 MED FILL — BRIMONIDINE TARTRATE 0.2 %: 0.2 | 34 days supply | Qty: 10 | Fill #0

## 2020-04-19 ENCOUNTER — Other Ambulatory Visit (HOSPITAL_BASED_OUTPATIENT_CLINIC_OR_DEPARTMENT_OTHER): Payer: Self-pay | Admitting: Endocrinology

## 2020-04-19 MED FILL — NovoLOG 100 UNIT/ML SOLN: 100 | 33 days supply | Qty: 10 | Fill #0

## 2020-04-19 MED FILL — LEVEMIR 100 UNITS/ML VIAL: 100 | 33 days supply | Qty: 20 | Fill #0

## 2020-04-25 ENCOUNTER — Other Ambulatory Visit (HOSPITAL_BASED_OUTPATIENT_CLINIC_OR_DEPARTMENT_OTHER): Payer: Self-pay | Admitting: Cardiology

## 2020-04-25 MED FILL — METOPROLOL TARTRATE 25 MG T: 25 | 30 days supply | Qty: 60 | Fill #0

## 2020-04-25 MED FILL — OLMESARTAN-HCTZ 40-25 MG TA: 40-25 | 30 days supply | Qty: 30 | Fill #1

## 2020-05-04 MED FILL — DORZOLAMIDE HCL-TIMOLOL MAL: 22.3-6.8 | 50 days supply | Qty: 10 | Fill #3

## 2020-05-04 MED FILL — ULTICARE PEN NDL 4MM 32G: 32G X 4 MM | 25 days supply | Qty: 100 | Fill #7

## 2020-05-04 MED FILL — ULTICARE SYR 0.3 ML 30GX5/1: 30G X 5/16" | 30 days supply | Qty: 100 | Fill #3

## 2020-05-22 ENCOUNTER — Other Ambulatory Visit (HOSPITAL_BASED_OUTPATIENT_CLINIC_OR_DEPARTMENT_OTHER): Payer: Self-pay | Admitting: Nurse Practitioner

## 2020-05-22 MED FILL — FLUTICASONE PROP 50 MCG SPR: 50 | 30 days supply | Qty: 16 | Fill #0

## 2020-05-23 MED FILL — LEVEMIR 100 UNITS/ML VIAL: 100 | 50 days supply | Qty: 20 | Fill #1

## 2020-05-23 MED FILL — NovoLOG 100 UNIT/ML SOLN: 100 | 30 days supply | Qty: 10 | Fill #1

## 2020-05-23 MED FILL — BRIMONIDINE TARTRATE 0.2 %: 0.2 | 30 days supply | Qty: 10 | Fill #1

## 2020-05-29 MED FILL — ULTICARE SYR 0.3 ML 30GX5/1: 30G X 5/16" | 30 days supply | Qty: 100 | Fill #4

## 2020-05-29 MED FILL — metFORMIN HCL ER 500 MG TB2: 500 | 90 days supply | Qty: 180 | Fill #0

## 2020-05-29 MED FILL — METOPROLOL TARTRATE 25 MG T: 25 | 30 days supply | Qty: 60 | Fill #1

## 2020-05-31 ENCOUNTER — Other Ambulatory Visit (HOSPITAL_BASED_OUTPATIENT_CLINIC_OR_DEPARTMENT_OTHER): Payer: Self-pay | Admitting: Nurse Practitioner

## 2020-05-31 MED FILL — OLMESARTAN-HCTZ 40-25 MG TA: 40-25 | 30 days supply | Qty: 30 | Fill #0

## 2020-06-11 MED FILL — OLMESARTAN-HCTZ 40-25 MG TA: 40-25 | 30 days supply | Qty: 30 | Fill #0

## 2020-06-20 MED FILL — DORZOLAMIDE HCL-TIMOLOL MAL: 22.3-6.8 | 50 days supply | Qty: 10 | Fill #4

## 2020-06-25 MED FILL — BRIMONIDINE TARTRATE 0.2 %: 0.2 | 30 days supply | Qty: 10 | Fill #2

## 2020-06-25 MED FILL — ULTICARE SYR 0.3 ML 30GX5/1: 30G X 5/16" | 30 days supply | Qty: 100 | Fill #5

## 2020-06-25 MED FILL — NovoLOG 100 UNIT/ML SOLN: 100 | 30 days supply | Qty: 10 | Fill #2

## 2020-06-28 ENCOUNTER — Other Ambulatory Visit (HOSPITAL_BASED_OUTPATIENT_CLINIC_OR_DEPARTMENT_OTHER): Payer: Self-pay | Admitting: Nurse Practitioner

## 2020-06-28 MED FILL — OLMESARTAN-HCTZ 40-25 MG TA: 40-25 | 30 days supply | Qty: 30 | Fill #0

## 2020-06-28 MED FILL — AMLODIPINE BESYLATE 10 MG T: 10 | 30 days supply | Qty: 30 | Fill #0

## 2020-07-17 MED FILL — OLMESARTAN-HCTZ 40-25 MG TA: 40-25 | 30 days supply | Qty: 30 | Fill #1

## 2020-07-17 MED FILL — METOPROLOL TARTRATE 25 MG T: 25 | 30 days supply | Qty: 60 | Fill #2

## 2020-07-23 MED FILL — AMLODIPINE BESYLATE 10 MG T: 10 | 30 days supply | Qty: 30 | Fill #0

## 2020-07-24 MED FILL — NovoLOG 100 UNIT/ML SOLN: 100 | 30 days supply | Qty: 10 | Fill #3

## 2020-07-24 MED FILL — LEVEMIR 100 UNITS/ML VIAL: 100 | 34 days supply | Qty: 20 | Fill #2

## 2020-07-24 MED FILL — ULTICARE SYR 0.3 ML 30GX5/1: 30G X 5/16" | 6 days supply | Qty: 20 | Fill #6

## 2020-07-25 ENCOUNTER — Other Ambulatory Visit (HOSPITAL_BASED_OUTPATIENT_CLINIC_OR_DEPARTMENT_OTHER): Payer: Self-pay | Admitting: Endocrinology

## 2020-07-27 ENCOUNTER — Other Ambulatory Visit (HOSPITAL_BASED_OUTPATIENT_CLINIC_OR_DEPARTMENT_OTHER): Payer: Self-pay | Admitting: Endocrinology

## 2020-07-27 MED FILL — ULTICARE INS 0.3 ML 30GX1/2: 30G X 1/2" | 30 days supply | Qty: 300 | Fill #0

## 2020-08-03 MED FILL — BRIMONIDINE TARTRATE 0.2 %: 0.2 | 34 days supply | Qty: 10 | Fill #2

## 2020-08-03 MED FILL — DORZOLAMIDE HCL-TIMOLOL MAL: 22.3-6.8 | 50 days supply | Qty: 10 | Fill #5

## 2020-08-28 MED FILL — OLMESARTAN-HCTZ 40-25 MG TA: 40-25 | 30 days supply | Qty: 30 | Fill #0

## 2020-09-11 ENCOUNTER — Other Ambulatory Visit (HOSPITAL_BASED_OUTPATIENT_CLINIC_OR_DEPARTMENT_OTHER): Payer: Self-pay

## 2020-09-11 MED ORDER — VALSARTAN-HYDROCHLOROTHIAZIDE 320-25 MG PO TABS
1.0000 | ORAL_TABLET | Freq: Every day | ORAL | 1 refills | Status: DC
  Filled 2020-09-11: qty 30, 30d supply, fill #0

## 2020-09-11 MED ORDER — VITAMIN D (ERGOCALCIFEROL) 1.25 MG (50000 UNIT) PO CAPS
1.0000 | ORAL_CAPSULE | ORAL | 4 refills | Status: AC
  Filled 2020-09-11: qty 4, 28d supply, fill #0

## 2020-09-11 MED ORDER — ATORVASTATIN CALCIUM 40 MG PO TABS
1.0000 | ORAL_TABLET | Freq: Every day | ORAL | 1 refills | Status: AC
  Filled 2020-09-11: qty 90, 90d supply, fill #0

## 2020-09-11 MED ORDER — AMLODIPINE BESYLATE 10 MG PO TABS
1.0000 | ORAL_TABLET | Freq: Every day | ORAL | 1 refills | Status: AC
  Filled 2020-09-11: qty 30, 30d supply, fill #0

## 2020-09-11 MED FILL — Insulin Aspart Inj 100 Unit/ML: SUBCUTANEOUS | 33 days supply | Qty: 10 | Fill #0 | Status: CN

## 2020-09-11 MED FILL — Metformin HCl Tab ER 24HR 500 MG: ORAL | 90 days supply | Qty: 180 | Fill #0 | Status: AC

## 2020-09-11 MED FILL — Metoprolol Tartrate Tab 25 MG: ORAL | 30 days supply | Qty: 60 | Fill #0 | Status: AC

## 2020-09-11 MED FILL — Insulin Aspart Inj 100 Unit/ML: SUBCUTANEOUS | 34 days supply | Qty: 10 | Fill #0 | Status: CN

## 2020-09-11 MED FILL — Dorzolamide HCl-Timolol Maleate Ophth Soln 2-0.5%: OPHTHALMIC | 50 days supply | Qty: 10 | Fill #0 | Status: AC

## 2020-09-11 MED FILL — Brimonidine Tartrate Ophth Soln 0.2%: OPHTHALMIC | 67 days supply | Qty: 10 | Fill #0 | Status: AC

## 2020-09-12 ENCOUNTER — Other Ambulatory Visit (HOSPITAL_BASED_OUTPATIENT_CLINIC_OR_DEPARTMENT_OTHER): Payer: Self-pay

## 2020-09-12 MED ORDER — NOVOLOG 100 UNIT/ML ~~LOC~~ SOLN
SUBCUTANEOUS | 3 refills | Status: AC
  Filled 2020-09-12: qty 20, 30d supply, fill #0

## 2020-09-13 ENCOUNTER — Other Ambulatory Visit (HOSPITAL_BASED_OUTPATIENT_CLINIC_OR_DEPARTMENT_OTHER): Payer: Self-pay

## 2020-09-14 ENCOUNTER — Other Ambulatory Visit (HOSPITAL_BASED_OUTPATIENT_CLINIC_OR_DEPARTMENT_OTHER): Payer: Self-pay

## 2020-09-14 MED ORDER — NOVOLOG 100 UNIT/ML ~~LOC~~ SOLN
SUBCUTANEOUS | 3 refills | Status: AC
  Filled 2020-09-14: qty 20, 34d supply, fill #0

## 2020-09-24 ENCOUNTER — Other Ambulatory Visit (HOSPITAL_BASED_OUTPATIENT_CLINIC_OR_DEPARTMENT_OTHER): Payer: Self-pay

## 2020-09-24 MED ORDER — LATANOPROST 0.005 % OP SOLN
OPHTHALMIC | 11 refills | Status: AC
  Filled 2020-09-24: qty 2.5, 25d supply, fill #0

## 2020-09-27 ENCOUNTER — Other Ambulatory Visit (HOSPITAL_BASED_OUTPATIENT_CLINIC_OR_DEPARTMENT_OTHER): Payer: Self-pay

## 2020-09-28 ENCOUNTER — Other Ambulatory Visit (HOSPITAL_BASED_OUTPATIENT_CLINIC_OR_DEPARTMENT_OTHER): Payer: Self-pay

## 2020-09-28 MED ORDER — ATORVASTATIN CALCIUM 80 MG PO TABS
1.0000 | ORAL_TABLET | Freq: Every day | ORAL | 0 refills | Status: AC
  Filled 2020-09-28 – 2021-02-12 (×2): qty 90, 90d supply, fill #0

## 2020-10-05 ENCOUNTER — Other Ambulatory Visit (HOSPITAL_BASED_OUTPATIENT_CLINIC_OR_DEPARTMENT_OTHER): Payer: Self-pay

## 2020-10-09 ENCOUNTER — Other Ambulatory Visit (HOSPITAL_BASED_OUTPATIENT_CLINIC_OR_DEPARTMENT_OTHER): Payer: Self-pay

## 2020-10-09 MED ORDER — RHOPRESSA 0.02 % OP SOLN
OPHTHALMIC | 11 refills | Status: AC
  Filled 2020-10-09: qty 2.5, 25d supply, fill #0
  Filled 2020-11-16: qty 2.5, 25d supply, fill #1

## 2020-10-10 ENCOUNTER — Other Ambulatory Visit (HOSPITAL_BASED_OUTPATIENT_CLINIC_OR_DEPARTMENT_OTHER): Payer: Self-pay

## 2020-10-11 ENCOUNTER — Other Ambulatory Visit (HOSPITAL_BASED_OUTPATIENT_CLINIC_OR_DEPARTMENT_OTHER): Payer: Self-pay

## 2020-10-12 ENCOUNTER — Other Ambulatory Visit (HOSPITAL_BASED_OUTPATIENT_CLINIC_OR_DEPARTMENT_OTHER): Payer: Self-pay

## 2020-10-26 ENCOUNTER — Other Ambulatory Visit (HOSPITAL_BASED_OUTPATIENT_CLINIC_OR_DEPARTMENT_OTHER): Payer: Self-pay

## 2020-10-26 MED FILL — Dorzolamide HCl-Timolol Maleate Ophth Soln 2-0.5%: OPHTHALMIC | 50 days supply | Qty: 10 | Fill #1 | Status: AC

## 2020-11-08 ENCOUNTER — Other Ambulatory Visit (HOSPITAL_BASED_OUTPATIENT_CLINIC_OR_DEPARTMENT_OTHER): Payer: Self-pay

## 2020-11-08 MED ORDER — OLMESARTAN MEDOXOMIL 40 MG PO TABS
40.0000 mg | ORAL_TABLET | Freq: Every day | ORAL | 0 refills | Status: DC
  Filled 2020-11-08: qty 90, 90d supply, fill #0

## 2020-11-08 MED ORDER — ATORVASTATIN CALCIUM 80 MG PO TABS
1.0000 | ORAL_TABLET | Freq: Every day | ORAL | 0 refills | Status: AC
  Filled 2020-11-08: qty 90, 90d supply, fill #0

## 2020-11-08 MED ORDER — HYDROCHLOROTHIAZIDE 25 MG PO TABS
1.0000 | ORAL_TABLET | Freq: Every day | ORAL | 0 refills | Status: DC
  Filled 2020-11-08: qty 90, 90d supply, fill #0

## 2020-11-08 MED ORDER — AMLODIPINE BESYLATE 10 MG PO TABS
1.0000 | ORAL_TABLET | Freq: Every day | ORAL | 0 refills | Status: AC
  Filled 2020-11-08: qty 90, 90d supply, fill #0

## 2020-11-08 MED ORDER — ERGOCALCIFEROL 1.25 MG (50000 UT) PO CAPS
1.0000 | ORAL_CAPSULE | ORAL | 2 refills | Status: AC
  Filled 2020-11-08: qty 4, 28d supply, fill #0
  Filled 2020-12-17: qty 4, 28d supply, fill #1

## 2020-11-12 ENCOUNTER — Other Ambulatory Visit (HOSPITAL_BASED_OUTPATIENT_CLINIC_OR_DEPARTMENT_OTHER): Payer: Self-pay

## 2020-11-12 MED ORDER — INSULIN ASPART 100 UNIT/ML IJ SOLN
INTRAMUSCULAR | 3 refills | Status: AC
  Filled 2020-11-12: qty 20, 34d supply, fill #0
  Filled 2021-01-07: qty 20, 34d supply, fill #1

## 2020-11-12 MED FILL — Insulin Detemir Inj 100 Unit/ML: SUBCUTANEOUS | 34 days supply | Qty: 20 | Fill #0 | Status: AC

## 2020-11-13 ENCOUNTER — Other Ambulatory Visit (HOSPITAL_BASED_OUTPATIENT_CLINIC_OR_DEPARTMENT_OTHER): Payer: Self-pay

## 2020-11-13 MED ORDER — AMOXICILLIN 875 MG PO TABS
ORAL_TABLET | ORAL | 1 refills | Status: DC
  Filled 2020-11-13: qty 14, 7d supply, fill #0

## 2020-11-15 ENCOUNTER — Other Ambulatory Visit (HOSPITAL_BASED_OUTPATIENT_CLINIC_OR_DEPARTMENT_OTHER): Payer: Self-pay

## 2020-11-15 MED ORDER — ROCKLATAN 0.02-0.005 % OP SOLN
OPHTHALMIC | 11 refills | Status: AC
  Filled 2020-11-15: qty 2.5, 25d supply, fill #0

## 2020-11-16 ENCOUNTER — Other Ambulatory Visit (HOSPITAL_BASED_OUTPATIENT_CLINIC_OR_DEPARTMENT_OTHER): Payer: Self-pay

## 2020-11-19 ENCOUNTER — Other Ambulatory Visit (HOSPITAL_BASED_OUTPATIENT_CLINIC_OR_DEPARTMENT_OTHER): Payer: Self-pay

## 2020-11-19 MED ORDER — METOPROLOL TARTRATE 25 MG PO TABS
25.0000 mg | ORAL_TABLET | Freq: Two times a day (BID) | ORAL | 3 refills | Status: AC
  Filled 2020-11-19 – 2020-11-26 (×2): qty 60, 30d supply, fill #0

## 2020-11-19 MED ORDER — DORZOLAMIDE HCL-TIMOLOL MAL 2-0.5 % OP SOLN
OPHTHALMIC | 11 refills | Status: AC
  Filled 2020-11-19 – 2020-12-03 (×2): qty 10, 50d supply, fill #0

## 2020-11-26 ENCOUNTER — Other Ambulatory Visit (HOSPITAL_BASED_OUTPATIENT_CLINIC_OR_DEPARTMENT_OTHER): Payer: Self-pay

## 2020-12-03 ENCOUNTER — Other Ambulatory Visit (HOSPITAL_BASED_OUTPATIENT_CLINIC_OR_DEPARTMENT_OTHER): Payer: Self-pay

## 2020-12-05 ENCOUNTER — Other Ambulatory Visit (HOSPITAL_BASED_OUTPATIENT_CLINIC_OR_DEPARTMENT_OTHER): Payer: Self-pay

## 2020-12-06 ENCOUNTER — Other Ambulatory Visit (HOSPITAL_BASED_OUTPATIENT_CLINIC_OR_DEPARTMENT_OTHER): Payer: Self-pay

## 2020-12-06 MED ORDER — BRIMONIDINE TARTRATE 0.2 % OP SOLN
OPHTHALMIC | 11 refills | Status: AC
  Filled 2020-12-06: qty 15, 50d supply, fill #0
  Filled 2021-02-24: qty 15, 50d supply, fill #1

## 2020-12-07 ENCOUNTER — Other Ambulatory Visit (HOSPITAL_BASED_OUTPATIENT_CLINIC_OR_DEPARTMENT_OTHER): Payer: Self-pay

## 2020-12-18 ENCOUNTER — Other Ambulatory Visit (HOSPITAL_BASED_OUTPATIENT_CLINIC_OR_DEPARTMENT_OTHER): Payer: Self-pay

## 2020-12-19 ENCOUNTER — Other Ambulatory Visit (HOSPITAL_BASED_OUTPATIENT_CLINIC_OR_DEPARTMENT_OTHER): Payer: Self-pay

## 2020-12-19 MED ORDER — ROCKLATAN 0.02-0.005 % OP SOLN
OPHTHALMIC | 11 refills | Status: AC
  Filled 2020-12-19: qty 2.5, 25d supply, fill #0

## 2020-12-20 ENCOUNTER — Other Ambulatory Visit (HOSPITAL_BASED_OUTPATIENT_CLINIC_OR_DEPARTMENT_OTHER): Payer: Self-pay

## 2020-12-30 MED FILL — Metformin HCl Tab ER 24HR 500 MG: ORAL | 90 days supply | Qty: 180 | Fill #1 | Status: AC

## 2020-12-31 ENCOUNTER — Other Ambulatory Visit (HOSPITAL_BASED_OUTPATIENT_CLINIC_OR_DEPARTMENT_OTHER): Payer: Self-pay

## 2021-01-07 ENCOUNTER — Other Ambulatory Visit (HOSPITAL_BASED_OUTPATIENT_CLINIC_OR_DEPARTMENT_OTHER): Payer: Self-pay

## 2021-01-07 MED ORDER — METOPROLOL TARTRATE 25 MG PO TABS
ORAL_TABLET | ORAL | 3 refills | Status: AC
  Filled 2021-01-07: qty 60, 30d supply, fill #0
  Filled 2021-02-12: qty 60, 30d supply, fill #1

## 2021-01-07 MED FILL — Insulin Detemir Inj 100 Unit/ML: SUBCUTANEOUS | 33 days supply | Qty: 20 | Fill #0 | Status: AC

## 2021-01-08 ENCOUNTER — Other Ambulatory Visit (HOSPITAL_BASED_OUTPATIENT_CLINIC_OR_DEPARTMENT_OTHER): Payer: Self-pay

## 2021-01-08 MED ORDER — NOVOLOG 100 UNIT/ML IJ SOLN
INTRAMUSCULAR | 3 refills | Status: AC
  Filled 2021-01-08 – 2021-02-12 (×2): qty 20, 33d supply, fill #0

## 2021-01-15 ENCOUNTER — Other Ambulatory Visit (HOSPITAL_BASED_OUTPATIENT_CLINIC_OR_DEPARTMENT_OTHER): Payer: Self-pay

## 2021-01-15 MED ORDER — DORZOLAMIDE HCL-TIMOLOL MAL 2-0.5 % OP SOLN
OPHTHALMIC | 11 refills | Status: AC
  Filled 2021-01-15: qty 10, 30d supply, fill #0
  Filled 2021-03-06: qty 10, 30d supply, fill #1

## 2021-01-28 ENCOUNTER — Other Ambulatory Visit (HOSPITAL_BASED_OUTPATIENT_CLINIC_OR_DEPARTMENT_OTHER): Payer: Self-pay

## 2021-01-28 MED ORDER — ROCKLATAN 0.02-0.005 % OP SOLN
OPHTHALMIC | 11 refills | Status: AC
  Filled 2021-01-28: qty 2.5, 25d supply, fill #0
  Filled 2021-02-24: qty 2.5, 25d supply, fill #1

## 2021-02-12 ENCOUNTER — Other Ambulatory Visit (HOSPITAL_BASED_OUTPATIENT_CLINIC_OR_DEPARTMENT_OTHER): Payer: Self-pay

## 2021-02-12 MED FILL — Insulin Detemir Inj 100 Unit/ML: SUBCUTANEOUS | 33 days supply | Qty: 20 | Fill #1 | Status: AC

## 2021-02-14 ENCOUNTER — Other Ambulatory Visit (HOSPITAL_BASED_OUTPATIENT_CLINIC_OR_DEPARTMENT_OTHER): Payer: Self-pay

## 2021-02-15 ENCOUNTER — Other Ambulatory Visit (HOSPITAL_BASED_OUTPATIENT_CLINIC_OR_DEPARTMENT_OTHER): Payer: Self-pay

## 2021-02-15 MED ORDER — OLMESARTAN MEDOXOMIL 40 MG PO TABS
40.0000 mg | ORAL_TABLET | Freq: Every day | ORAL | 0 refills | Status: AC
  Filled 2021-02-15: qty 30, 30d supply, fill #0

## 2021-02-15 MED ORDER — HYDROCHLOROTHIAZIDE 25 MG PO TABS
25.0000 mg | ORAL_TABLET | Freq: Every day | ORAL | 0 refills | Status: AC
  Filled 2021-02-15: qty 30, 30d supply, fill #0

## 2021-02-19 ENCOUNTER — Other Ambulatory Visit (HOSPITAL_BASED_OUTPATIENT_CLINIC_OR_DEPARTMENT_OTHER): Payer: Self-pay

## 2021-02-25 ENCOUNTER — Other Ambulatory Visit (HOSPITAL_BASED_OUTPATIENT_CLINIC_OR_DEPARTMENT_OTHER): Payer: Self-pay

## 2021-02-25 MED ORDER — CEPHALEXIN 500 MG PO CAPS
ORAL_CAPSULE | ORAL | 0 refills | Status: DC
  Filled 2021-02-25: qty 40, 10d supply, fill #0

## 2021-02-25 MED ORDER — MUPIROCIN 2 % EX OINT
TOPICAL_OINTMENT | CUTANEOUS | 0 refills | Status: AC
  Filled 2021-02-25: qty 22, 7d supply, fill #0
  Filled 2021-09-03: qty 22, 14d supply, fill #0

## 2021-03-06 ENCOUNTER — Other Ambulatory Visit (HOSPITAL_BASED_OUTPATIENT_CLINIC_OR_DEPARTMENT_OTHER): Payer: Self-pay

## 2021-07-11 ENCOUNTER — Other Ambulatory Visit (HOSPITAL_BASED_OUTPATIENT_CLINIC_OR_DEPARTMENT_OTHER): Payer: Self-pay

## 2021-09-04 ENCOUNTER — Other Ambulatory Visit (HOSPITAL_BASED_OUTPATIENT_CLINIC_OR_DEPARTMENT_OTHER): Payer: Self-pay

## 2023-07-05 ENCOUNTER — Other Ambulatory Visit: Payer: Self-pay

## 2023-07-05 ENCOUNTER — Ambulatory Visit
Admission: EM | Admit: 2023-07-05 | Discharge: 2023-07-05 | Disposition: A | Discharge status: Home / Self Care | Payer: 59 | Attending: Family Medicine | Admitting: Family Medicine

## 2023-07-05 DIAGNOSIS — L089 Local infection of the skin and subcutaneous tissue, unspecified: Secondary | ICD-10-CM | POA: Diagnosis not present

## 2023-07-05 DIAGNOSIS — T148XXA Other injury of unspecified body region, initial encounter: Secondary | ICD-10-CM | POA: Diagnosis not present

## 2023-07-05 MED ORDER — DOXYCYCLINE HYCLATE 100 MG PO CAPS: 100.0000 mg | ORAL_CAPSULE | Freq: Two times a day (BID) | ORAL | 0 refills | Status: AC

## 2023-07-05 NOTE — ED Provider Notes (Signed)
[facility]    CSN: [id number] Arrival date & time: 07/05/23  1228      History   Chief Complaint Chief Complaint  Patient presents with   Wound Check    HPI Cynthia Mata is a 78 y.o. female.   HPI 78 year old female presents with wound check.  Patient reports wound of right ankle scar from previous surgery and wound on top of that.  Patient's daughter reports scab has fallen off and wound has become worse in appearance.  PMH significant for T2DM, HTN, and HLD.  Past Medical History:  Diagnosis Date   Anemia    Breast cyst    Cataract    Diabetes mellitus without complication (HCC)    Hyperlipidemia    Hypertension    Macular degeneration    Osteoporosis    Thyroid disease    Trapezius strain 10/23/2014    Patient Active Problem List   Diagnosis Date Noted   Weakness of left upper extremity 08/14/2019   Cerumen impaction 09/12/2015   Injury of right toe 07/23/2015   LBBB (left bundle branch block) 04/20/2015   Pain in joint, shoulder region 11/28/2014   Trapezius strain 10/23/2014   Lymphadenopathy 10/23/2014   Chest pressure 08/11/2014   Vision changes 08/11/2014   Chest pain 08/11/2014   Diabetes mellitus type 2, controlled (HCC) 08/11/2014   Pain in the chest    Anemia 06/29/2014   Hypothyroid 06/29/2014   Hyperlipidemia 06/29/2014   Sinusitis, acute maxillary 06/29/2014   DM (diabetes mellitus) (HCC) 05/31/2013   Essential hypertension, benign 05/31/2013    Past Surgical History:  Procedure Laterality Date   ANKLE FRACTURE SURGERY     Right ankle   CATARACT EXTRACTION     cataracts  05-2015   TONSILLECTOMY     Abcess removed also    OB History   No obstetric history on file.      Home Medications    Prior to Admission medications   Medication Sig Start Date End Date Taking? Authorizing Provider  doxycycline (VIBRAMYCIN) 100 MG capsule Take 1 capsule (100 mg total) by mouth 2 (two) times daily for 10 days.  07/05/23 07/15/23 Yes Cynthia Iha, FNP  amLODipine (NORVASC) 10 MG tablet Take 10 mg by mouth daily.    [provider]  amLODipine (NORVASC) 10 MG tablet TAKE 1 TABLET (10 MG TOTAL) BY MOUTH DAILY. 06/28/20 06/28/21  Laureen Ochs A, FNP  amLODipine (NORVASC) 10 MG tablet Take 1 tablet (10 mg total) by mouth daily. 09/11/20     amLODipine (NORVASC) 10 MG tablet Take 1 tablet (10 mg total) by mouth daily. 11/08/20     aspirin 325 MG tablet Take 1 tablet (325 mg total) by mouth daily. 08/16/19   Philip Aspen, Limmie Patricia, MD  atorvastatin (LIPITOR) 20 MG tablet Take 1 tablet (20 mg total) by mouth daily. Patient taking differently: Take 20 mg by mouth at bedtime.  12/30/16   Saguier, Ramon Dredge, PA-C  atorvastatin (LIPITOR) 40 MG tablet Take 1 tablet (40 mg total) by mouth daily. 09/11/20     atorvastatin (LIPITOR) 80 MG tablet Take 1 tablet (80 mg total) by mouth daily. 09/28/20     atorvastatin (LIPITOR) 80 MG tablet Take 1 tablet (80 mg total) by mouth daily. 11/08/20     brimonidine (ALPHAGAN) 0.2 % ophthalmic solution Place 1 drop into both eyes 3 (three) times daily.  07/24/17   [provider]  brimonidine (ALPHAGAN) 0.2 % ophthalmic solution Place 1  drop into both eyes 3 times daily. 12/06/20     CAMPHOR EX Apply 1 application topically at bedtime as needed (foot pain).    [provider]  dorzolamide-timolol (COSOPT) 22.3-6.8 MG/ML ophthalmic solution Place 1 drop into both eyes 2 (two) times daily.  07/24/17   [provider]  dorzolamide-timolol (COSOPT) 22.3-6.8 MG/ML ophthalmic solution Place 1 drop into both eyes 2 times daily. 11/19/20     dorzolamide-timolol (COSOPT) 22.3-6.8 MG/ML ophthalmic solution Place 1 drop into both eyes 2 times daily. 01/15/21     ergocalciferol (VITAMIN D2) 1.25 MG (50000 UT) capsule Take 1 capsule (50,000 Units total) by mouth once a week. 11/08/20     fluticasone (FLONASE) 50 MCG/ACT nasal spray PLACE 1 SPRAY INTO EACH NOSTRIL 2 TIMES DAILY  05/22/20 05/22/21  Laureen Ochs A, FNP  glucose blood (ONETOUCH VERIO) test strip Use to check blood sugars 3 times daily. Dx Code E11.9 02/16/17   Reather Littler, MD  glucose blood (ONETOUCH VERIO) test strip Use as instructed Patient taking differently: 1 each by Other route See admin instructions. Use as instructed 03/15/18   Reather Littler, MD  hydrochlorothiazide (HYDRODIURIL) 25 MG tablet Take 1 tablet (25 mg total) by mouth daily. 02/15/21     insulin aspart (NOVOLOG FLEXPEN) 100 UNIT/ML FlexPen Inject 6 units with breakfast and lunch and 8 units with supper Patient taking differently: Inject 6-8 Units into the skin See admin instructions. Inject 6 units subcutaneously with breakfast and lunch and 8 units with supper 03/15/18   Reather Littler, MD  insulin aspart (NOVOLOG) 100 UNIT/ML injection INJECT UP TO 10 UNITS UNDER THE SKIN 3 TIMES A DAY WITH MEALS 07/27/20 07/27/21  Doristine Bosworth., MD  insulin aspart (NOVOLOG) 100 UNIT/ML injection INJECT UP TO 10 UNITS UNDER THE SKIN 3 TIMES A DAY WITH MEALS 04/19/20 04/19/21  Doristine Bosworth., MD  insulin aspart (NOVOLOG) 100 UNIT/ML injection INJECT UP TO 10 UNITS UNDER THE SKIN THREE TIMES DAILY WITH MEALS 01/11/20 01/10/21  Doristine Bosworth., MD  insulin aspart (NOVOLOG) 100 UNIT/ML injection Inject up to 20 units under the skin 3 times daily with meals 09/14/20     insulin detemir (LEVEMIR) 100 UNIT/ML injection INJECT 40 UNITS UNDER THE SKIN DAILY MAXIMUM DAILY DOSE IS 60 UNITS 07/27/20 07/27/21  Doristine Bosworth., MD  insulin detemir (LEVEMIR) 100 UNIT/ML injection INJECT 40 UNITS UNDER THE SKIN DAILY MAX DAILY DOSE=60 UNITS 04/19/20 04/19/21  Doristine Bosworth., MD  Insulin Glargine (TOUJEO MAX SOLOSTAR) 300 UNIT/ML SOPN Inject 34 Units into the skin daily. Patient taking differently: Inject 34 Units into the skin daily with breakfast.  04/08/18   Shamleffer, Konrad Dolores, MD  latanoprost (XALATAN) 0.005 % ophthalmic solution Place 1 drop  into both eyes at bedtime.    [provider]  latanoprost (XALATAN) 0.005 % ophthalmic solution Place 1 drop into both eyes nightly. 09/24/20     liraglutide (VICTOZA) 18 MG/3ML SOPN Inject 1.8 mg into the skin daily before breakfast.     [provider]  metFORMIN (GLUCOPHAGE-XR) 500 MG 24 hr tablet Take 1 tablet (500 mg total) by mouth 3 (three) times daily. Patient taking differently: Take 500 mg by mouth 2 (two) times daily with a meal.  04/08/18   Shamleffer, Konrad Dolores, MD  metFORMIN (GLUCOPHAGE-XR) 500 MG 24 hr tablet TAKE 1 TABLET (500 MG TOTAL) BY MOUTH 2 TIMES DAILY. 01/11/20 04/02/21  Doristine Bosworth., MD  metoprolol  tartrate (LOPRESSOR) 25 MG tablet TAKE 1 TABLET BY MOUTH TWICE DAILY 11/19/20     metoprolol tartrate (LOPRESSOR) 25 MG tablet Take 1 tablet (25 mg total) by mouth 2 times daily. 01/07/21     mupirocin ointment (BACTROBAN) 2 % Apply on to the skin 3 times daily 02/24/21     Netarsudil Dimesylate (RHOPRESSA) 0.02 % SOLN Place 1 drop into both eyes nightly. 10/09/20     Netarsudil-Latanoprost (ROCKLATAN) 0.02-0.005 % SOLN Place 1 drop into both eyes nightly. 11/15/20     Netarsudil-Latanoprost (ROCKLATAN) 0.02-0.005 % SOLN Place 1 drop into both eyes nightly. 12/19/20     Netarsudil-Latanoprost (ROCKLATAN) 0.02-0.005 % SOLN Place 1 drop into both eyes nightly. 01/28/21     NOVOLOG 100 UNIT/ML injection Inject up to 20 units subcutaneously three times daily with meals 09/12/20     NOVOLOG 100 UNIT/ML injection Inject up to 20 units under the skin three times daily with meals 09/14/20     NOVOLOG 100 UNIT/ML injection Inject up to 20 units under the skin 3 times a day at meals 01/08/21     olmesartan (BENICAR) 40 MG tablet Take 1 tablet (40 mg total) by mouth daily. 02/15/21     olmesartan-hydrochlorothiazide (BENICAR HCT) 40-25 MG tablet TAKE 1 TABLET BY MOUTH DAILY. 07/23/18   Saguier, Ramon Dredge, PA-C  olmesartan-hydrochlorothiazide (BENICAR HCT) 40-25 MG tablet TAKE 1  TABLET BY MOUTH DAILY. 06/28/20 06/28/21  Eather Colas, FNP  olmesartan-hydrochlorothiazide (BENICAR HCT) 40-25 MG tablet TAKE 1 TABLET BY MOUTH ONCE DAILY 05/31/20 05/31/21  Laureen Ochs A, FNP  olmesartan-hydrochlorothiazide (BENICAR HCT) 40-25 MG tablet TAKE 1 TABLET BY MOUTH DAILY. 03/30/20 03/30/21  Eather Colas, FNP  ONE TOUCH ULTRA TEST test strip Use to check blood sugar 3 times per day dx code E11.9 05/28/18   Reather Littler, MD  TECHLITE PEN NEEDLES 32G X 6 MM MISC USE TO INJECT INSULIN FOUR TIMES DAILY Patient taking differently: 1 each by Other route in the morning, at noon, in the evening, and at bedtime.  05/03/18   Reather Littler, MD  Vitamin D, Ergocalciferol, (DRISDOL) 1.25 MG (50000 UNIT) CAPS capsule Take 50,000 Units by mouth every Monday.    [provider]  Vitamin D, Ergocalciferol, (DRISDOL) 1.25 MG (50000 UNIT) CAPS capsule Take 1 capsule (50,000 Units total) by mouth once a week. 09/11/20     valsartan-hydrochlorothiazide (DIOVAN-HCT) 320-25 MG tablet Take 1 tablet by mouth daily. 09/11/20 11/08/20      Family History Family History  Problem Relation Age of Onset   Hypertension Maternal Aunt    Heart disease Maternal Aunt    CAD Maternal Grandfather    Diabetes Neg Hx    COPD Neg Hx     Social History Social History   Tobacco Use   Smoking status: Never   Smokeless tobacco: Never  Substance Use Topics   Alcohol use: No    Alcohol/week: 0.0 standard drinks of alcohol   Drug use: No     Allergies   Patient has no known allergies.   Review of Systems Review of Systems  Skin:  Positive for wound.     Physical Exam Triage Vital Signs ED Triage Vitals  Encounter Vitals Group     BP 07/05/23 1243 (!) 131/90     Systolic BP Percentile --      Diastolic BP Percentile --      Pulse Rate 07/05/23 1243 85     Resp 07/05/23 1243 16  Temp 07/05/23 1243 97.8 F (36.6 C)     Temp src --      SpO2 07/05/23 1243 98 %     Weight --      Height --       Head Circumference --      Peak Flow --      Pain Score 07/05/23 1241 8     Pain Loc --      Pain Education --      Exclude from Growth Chart --    No data found.  Updated Vital Signs BP (!) 131/90   Pulse 85   Temp 97.8 F (36.6 C)   Resp 16   SpO2 98%    Physical Exam Vitals and nursing note reviewed.  Constitutional:      Appearance: Normal appearance. She is normal weight.  HENT:     Mouth/Throat:     Mouth: Mucous membranes are moist.     Pharynx: Oropharynx is clear.  Eyes:     Extraocular Movements: Extraocular movements intact.     Conjunctiva/sclera: Conjunctivae normal.     Pupils: Pupils are equal, round, and reactive to light.  Cardiovascular:     Rate and Rhythm: Normal rate and regular rhythm.     Pulses: Normal pulses.     Heart sounds: Normal heart sounds.  Pulmonary:     Effort: Pulmonary effort is normal.     Breath sounds: Normal breath sounds. No wheezing, rhonchi or rales.  Musculoskeletal:        General: Normal range of motion.     Cervical back: Normal range of motion and neck supple.  Skin:    General: Skin is warm and dry.     Comments: Right ankle (inferior aspect adjacent to lateral malleolus): Mildly erythematous wound noted-please see the image below  Neurological:     General: No focal deficit present.     Mental Status: She is alert and oriented to person, place, and time. Mental status is at baseline.      UC Treatments / Results  Labs (all labs ordered are listed, but only abnormal results are displayed) Labs Reviewed - No data to display  EKG   Radiology No results found.  Procedures Procedures (including critical care time)  Medications Ordered in UC Medications - No data to display  Initial Impression / Assessment and Plan / UC Course  I have reviewed the triage vital signs and the nursing notes.  Pertinent labs & imaging results that were available during my care of the patient were reviewed by me and  considered in my medical decision making (see chart for details).     MDM: 1.  Wound infection-Rx'd Doxycycline 100 mg capsule: Take 1 capsule twice daily x 7 days. Advised patient to take medication as directed with food to completion.  Encouraged to increase daily water intake to 64 ounces per day while taking these medication.  Advised if symptoms worsen and/or unresolved please follow-up PCP or for further evaluation.  Patient discharged home, hemodynamically stable. Final Clinical Impressions(s) / UC Diagnoses   Final diagnoses:  Wound infection     Discharge Instructions      Advised patient to take medication as directed with food to completion.  Encouraged to increase daily water intake to 64 ounces per day while taking these medication.  Advised patient/daughter to leave affected wound area open to air to heal by secondary intention/scab formation.  Advised if symptoms worsen and/or unresolved please follow-up PCP  or for further evaluation.     ED Prescriptions     Medication Sig Dispense Auth. Provider   doxycycline (VIBRAMYCIN) 100 MG capsule Take 1 capsule (100 mg total) by mouth 2 (two) times daily for 10 days. 20 capsule Cynthia Iha, FNP      PDMP not reviewed this encounter.   Cynthia Iha, FNP 07/05/23 1445

## 2023-07-05 NOTE — Discharge Instructions (Addendum)
Advised patient to take medication as directed with food to completion.  Encouraged to increase daily water intake to 64 ounces per day while taking these medication.  Advised patient/daughter to leave affected wound area open to air to heal by secondary intention/scab formation.  Advised if symptoms worsen and/or unresolved please follow-up PCP or for further evaluation.

## 2023-07-05 NOTE — ED Triage Notes (Signed)
Pt presents to uc with co of wound to right ankle. Pt has scar from previous surgery and the wound is on top of that. Her daughter called pcp who advised her to keep covered and clean with dial. Scab has now fallen off. Wound is painful and yellow drainage. Denies any redness or fevers to site.
